# Patient Record
Sex: Female | Born: 1940 | Race: White | Smoking: Former smoker
Health system: Southern US, Community
[De-identification: ages and names within clinical notes are randomized; demographics above are authoritative.]

## PROBLEM LIST (undated history)

## (undated) DIAGNOSIS — I471 Supraventricular tachycardia, unspecified: Secondary | ICD-10-CM

## (undated) DIAGNOSIS — I493 Ventricular premature depolarization: Secondary | ICD-10-CM

## (undated) DIAGNOSIS — I779 Disorder of arteries and arterioles, unspecified: Secondary | ICD-10-CM

## (undated) DIAGNOSIS — J8410 Pulmonary fibrosis, unspecified: Secondary | ICD-10-CM

## (undated) DIAGNOSIS — I10 Essential (primary) hypertension: Secondary | ICD-10-CM

## (undated) DIAGNOSIS — J449 Chronic obstructive pulmonary disease, unspecified: Secondary | ICD-10-CM

## (undated) DIAGNOSIS — I509 Heart failure, unspecified: Secondary | ICD-10-CM

## (undated) DIAGNOSIS — J45909 Unspecified asthma, uncomplicated: Secondary | ICD-10-CM

## (undated) DIAGNOSIS — R0600 Dyspnea, unspecified: Secondary | ICD-10-CM

## (undated) DIAGNOSIS — Z9889 Other specified postprocedural states: Secondary | ICD-10-CM

## (undated) DIAGNOSIS — J302 Other seasonal allergic rhinitis: Secondary | ICD-10-CM

## (undated) DIAGNOSIS — I5181 Takotsubo syndrome: Secondary | ICD-10-CM

## (undated) DIAGNOSIS — R0609 Other forms of dyspnea: Secondary | ICD-10-CM

## (undated) DIAGNOSIS — I272 Pulmonary hypertension, unspecified: Secondary | ICD-10-CM

## (undated) DIAGNOSIS — I499 Cardiac arrhythmia, unspecified: Secondary | ICD-10-CM

## (undated) HISTORY — DX: Disorder of arteries and arterioles, unspecified: I77.9

## (undated) HISTORY — PX: HYSTERECTOMY: SHX81

## (undated) HISTORY — PX: CARDIAC CATHETERIZATION: SHX172

## (undated) HISTORY — DX: Cardiac arrhythmia, unspecified: I49.9

## (undated) HISTORY — DX: Other specified postprocedural states: Z98.890

## (undated) HISTORY — DX: Pulmonary hypertension, unspecified: I27.20

## (undated) HISTORY — PX: TUBAL LIGATION: SHX77

## (undated) HISTORY — DX: Dyspnea, unspecified: R06.00

## (undated) HISTORY — PX: OTHER SURGICAL HISTORY: SHX169

## (undated) HISTORY — DX: Essential (primary) hypertension: I10

## (undated) HISTORY — DX: Pulmonary fibrosis, unspecified: J84.10

## (undated) HISTORY — DX: Supraventricular tachycardia, unspecified: I47.10

## (undated) HISTORY — PX: ABDOMINAL HYSTERECTOMY: SHX81

## (undated) HISTORY — DX: Unspecified asthma, uncomplicated: J45.909

## (undated) HISTORY — DX: Chronic obstructive pulmonary disease, unspecified: J44.9

## (undated) HISTORY — DX: Ventricular premature depolarization: I49.3

## (undated) HISTORY — DX: Supraventricular tachycardia: I47.1

---

## 2008-10-22 ENCOUNTER — Inpatient Hospital Stay
Admit: 2008-10-22 | Disposition: A | Discharge status: Home / Self Care | Payer: Self-pay | Source: Ambulatory Visit | Admitting: Pulmonary Disease

## 2009-03-18 ENCOUNTER — Ambulatory Visit
Admit: 2009-03-18 | Disposition: A | Discharge status: Home / Self Care | Payer: Self-pay | Source: Ambulatory Visit | Admitting: Female Pelvic Medicine and Reconstructive Surgery

## 2009-03-18 LAB — COMPREHENSIVE METABOLIC PANEL
ALT: 28 U/L (ref 3–36)
AST (SGOT): 24 U/L (ref 10–41)
Albumin/Globulin Ratio: 1.5 (ref 1.1–1.8)
Albumin: 3.7 g/dL (ref 3.4–4.9)
Alkaline Phosphatase: 93 U/L (ref 43–112)
BUN: 16 mg/dL (ref 8–20)
Bilirubin, Total: 0.4 mg/dL (ref 0.1–1.0)
CO2: 26 mEq/L (ref 21–30)
Calcium: 8.8 mg/dL (ref 8.6–10.2)
Chloride: 102 mEq/L (ref 98–107)
Creatinine: 0.6 mg/dL (ref 0.6–1.5)
Globulin: 2.5 g/dL (ref 2.0–3.7)
Glucose: 75 mg/dL (ref 70–100)
Potassium: 4.5 mEq/L (ref 3.6–5.0)
Protein, Total: 6.2 g/dL (ref 6.0–8.0)
Sodium: 139 mEq/L (ref 136–146)

## 2009-03-18 LAB — CBC
Hematocrit: 44.9 % (ref 37.0–47.0)
Hgb: 14.1 G/DL (ref 12.0–16.0)
MCH: 29.4 PG (ref 28.0–32.0)
MCHC: 31.4 G/DL — ABNORMAL LOW (ref 32.0–36.0)
MCV: 93.5 FL (ref 80.0–100.0)
MPV: 11.3 FL (ref 9.4–12.3)
Platelets: 275 /mm3 (ref 140–400)
RBC: 4.8 /mm3 (ref 4.20–5.40)
RDW: 13.7 % (ref 11.5–15.0)
WBC: 9.69 /mm3 (ref 3.50–10.80)

## 2009-03-18 LAB — GFR

## 2009-04-11 ENCOUNTER — Ambulatory Visit: Payer: Self-pay

## 2009-04-11 ENCOUNTER — Ambulatory Visit
Admission: RE | Admit: 2009-04-11 | Disposition: A | Discharge status: Home / Self Care | Payer: Self-pay | Source: Ambulatory Visit | Admitting: Female Pelvic Medicine and Reconstructive Surgery

## 2009-04-11 LAB — BASIC METABOLIC PANEL
BUN: 6 mg/dL — ABNORMAL LOW (ref 8–20)
CO2: 20 mEq/L — ABNORMAL LOW (ref 21–30)
Calcium: 8 mg/dL — ABNORMAL LOW (ref 8.6–10.2)
Chloride: 106 mEq/L (ref 98–107)
Creatinine: 0.5 mg/dL — ABNORMAL LOW (ref 0.6–1.5)
Glucose: 90 mg/dL (ref 70–100)
Potassium: 3.9 mEq/L (ref 3.6–5.0)
Sodium: 137 mEq/L (ref 136–146)

## 2009-04-11 LAB — MAGNESIUM: Magnesium: 1.7 mg/dL (ref 1.6–2.3)

## 2009-04-11 LAB — GFR: EGFR: >60

## 2009-04-12 LAB — CBC
Hematocrit: 38.5 % (ref 37.0–47.0)
Hgb: 11.6 g/dL — ABNORMAL LOW (ref 12.0–16.0)
MCH: 28.6 pg (ref 28.0–32.0)
MCHC: 30.1 g/dL — ABNORMAL LOW (ref 32.0–36.0)
MCV: 94.8 fL (ref 80.0–100.0)
MPV: 10.3 fL (ref 9.4–12.3)
Platelets: 225 10*3/uL (ref 140–400)
RBC: 4.06 10*6/uL — ABNORMAL LOW (ref 4.20–5.40)
RDW: 14 % (ref 12–15)
WBC: 10.4 10*3/uL (ref 3.50–10.80)

## 2009-04-12 LAB — BASIC METABOLIC PANEL
BUN: 5 mg/dL — ABNORMAL LOW (ref 8–20)
CO2: 24 mEq/L (ref 21–30)
Calcium: 7.7 mg/dL — ABNORMAL LOW (ref 8.6–10.2)
Chloride: 103 mEq/L (ref 98–107)
Creatinine: 0.6 mg/dL (ref 0.6–1.5)
Glucose: 82 mg/dL (ref 70–100)
Potassium: 4 mEq/L (ref 3.6–5.0)
Sodium: 137 mEq/L (ref 136–146)

## 2009-04-12 LAB — GFR: EGFR: >60

## 2009-04-12 LAB — MAGNESIUM: Magnesium: 2.1 mg/dL (ref 1.6–2.3)

## 2009-04-13 LAB — CBC
Hematocrit: 37.3 % (ref 37.0–47.0)
Hgb: 11.3 g/dL — ABNORMAL LOW (ref 12.0–16.0)
MCH: 28.6 pg (ref 28.0–32.0)
MCHC: 30.3 g/dL — ABNORMAL LOW (ref 32.0–36.0)
MCV: 94.4 fL (ref 80.0–100.0)
MPV: 10.6 fL (ref 9.4–12.3)
Platelets: 220 10*3/uL (ref 140–400)
RBC: 3.95 10*6/uL — ABNORMAL LOW (ref 4.20–5.40)
RDW: 14 % (ref 12–15)
WBC: 9.37 10*3/uL (ref 3.50–10.80)

## 2009-04-13 LAB — BASIC METABOLIC PANEL
BUN: 5 mg/dL — ABNORMAL LOW (ref 8–20)
CO2: 29 mEq/L (ref 21–30)
Calcium: 7.8 mg/dL — ABNORMAL LOW (ref 8.6–10.2)
Chloride: 100 mEq/L (ref 98–107)
Creatinine: 0.5 mg/dL — ABNORMAL LOW (ref 0.6–1.5)
Glucose: 121 mg/dL — ABNORMAL HIGH (ref 70–100)
Potassium: 3.6 mEq/L (ref 3.6–5.0)
Sodium: 136 mEq/L (ref 136–146)

## 2009-04-13 LAB — TSH: TSH: 1.488 u[IU]/mL (ref 0.350–4.940)

## 2009-04-13 LAB — T4, FREE: T4 Free: 1.68 ng/dL — ABNORMAL HIGH (ref 0.70–1.48)

## 2009-04-13 LAB — TROPONIN I: Troponin I: 0.02 ng/mL (ref 0.00–0.09)

## 2009-04-13 LAB — GFR: EGFR: >60

## 2009-04-13 LAB — MAGNESIUM: Magnesium: 2 mg/dL (ref 1.6–2.3)

## 2009-04-13 LAB — CK: Creatine Kinase (CK): 106 U/L (ref 20–140)

## 2009-04-17 LAB — LAB USE ONLY - HISTORICAL SURGICAL PATHOLOGY

## 2010-03-31 ENCOUNTER — Observation Stay
Admission: EM | Admit: 2010-03-31 | Disposition: A | Discharge status: Home / Self Care | Payer: Self-pay | Source: Emergency Department | Admitting: Hospitalist

## 2010-03-31 LAB — CBC AND DIFFERENTIAL
Baso(Absolute): 0.02 10*3/uL (ref 0.00–0.20)
Basophils: 0 % (ref 0–2)
Eosinophils Absolute: 0.1 10*3/uL (ref 0.00–0.70)
Eosinophils: 1 % (ref 0–5)
Hematocrit: 43 % (ref 37.0–47.0)
Hgb: 13.9 g/dL (ref 12.0–16.0)
Immature Granulocytes Absolute: 0.03 10*3/uL
Immature Granulocytes: 0 % (ref 0–1)
Lymphocytes Absolute: 2.4 10*3/uL (ref 0.50–4.40)
Lymphocytes: 19 % (ref 15–41)
MCH: 29.4 pg (ref 28.0–32.0)
MCHC: 32.3 g/dL (ref 32.0–36.0)
MCV: 90.9 fL (ref 80.0–100.0)
MPV: 10.7 fL (ref 9.4–12.3)
Monocytes Absolute: 1.22 10*3/uL — ABNORMAL HIGH (ref 0.00–1.20)
Monocytes: 10 % (ref 0–11)
Neutrophils Absolute: 8.85 10*3/uL
Neutrophils: 70 % (ref 52–75)
Platelets: 306 10*3/uL (ref 140–400)
RBC: 4.73 10*6/uL (ref 4.20–5.40)
RDW: 14 % (ref 12–15)
WBC: 12.62 10*3/uL — ABNORMAL HIGH (ref 3.50–10.80)

## 2010-03-31 LAB — BASIC METABOLIC PANEL
BUN: 15 mg/dL (ref 8–20)
CO2: 24 mEq/L (ref 21–30)
Calcium: 8.9 mg/dL (ref 8.6–10.2)
Chloride: 103 mEq/L (ref 98–107)
Creatinine: 0.8 mg/dL (ref 0.6–1.5)
Glucose: 108 mg/dL — ABNORMAL HIGH (ref 70–100)
Potassium: 4.9 mEq/L (ref 3.6–5.0)
Sodium: 138 mEq/L (ref 136–146)

## 2010-03-31 LAB — GFR: EGFR: >60

## 2010-03-31 LAB — CK: Creatine Kinase (CK): 109 U/L (ref 20–140)

## 2010-03-31 LAB — I-STAT TROPONIN: i-STAT Troponin: 0 ng/mL (ref 0.00–0.09)

## 2010-04-01 LAB — CK
Creatine Kinase (CK): 90 U/L (ref 20–140)
Creatine Kinase (CK): 70 U/L (ref 20–140)

## 2010-04-01 LAB — TSH: TSH: 3.527 u[IU]/mL (ref 0.350–4.940)

## 2010-04-01 LAB — TROPONIN I
Troponin I: 0.13 ng/mL — ABNORMAL HIGH (ref 0.00–0.09)
Troponin I: 0.07 ng/mL (ref 0.00–0.09)

## 2011-01-22 LAB — ECG 12-LEAD
Atrial Rate: 100 {beats}/min
Atrial Rate: 188 {beats}/min
P Axis: 56 degrees
P-R Interval: 112 ms
P-R Interval: 168 ms
Q-T Interval: 258 ms
Q-T Interval: 348 ms
QRS Duration: 118 ms
QRS Duration: 124 ms
QTC Calculation (Bezet): 448 ms
QTC Calculation (Bezet): 456 ms
R Axis: -22 degrees
R Axis: -27 degrees
T Axis: 47 degrees
T Axis: 76 degrees
Ventricular Rate: 100 {beats}/min
Ventricular Rate: 188 {beats}/min

## 2011-01-29 LAB — ECG 12-LEAD
Atrial Rate: 86 {beats}/min
Atrial Rate: 99 {beats}/min
P Axis: 78 degrees
P Axis: 79 degrees
P-R Interval: 150 ms
P-R Interval: 136 ms
Q-T Interval: 390 ms
Q-T Interval: 396 ms
QRS Duration: 82 ms
QRS Duration: 90 ms
QTC Calculation (Bezet): 466 ms
QTC Calculation (Bezet): 508 ms
R Axis: -5 degrees
R Axis: -11 degrees
T Axis: 37 degrees
T Axis: -4 degrees
Ventricular Rate: 86 {beats}/min
Ventricular Rate: 99 {beats}/min

## 2011-02-20 NOTE — Consults (Signed)
Account Number: [id number]      Document ID: [id number]      Admit Date: 04/11/2009      Service Date: 04/13/2009            Patient Location: FI320-01      Patient Type: A            CONSULTING PHYSICIAN: Johnell Comings MD            REFERRING PHYSICIAN:Dr. Lorelei Pont                  CONSULTING SERVICE:      Cardiology.            REASON FOR CONSULTATION:      We thank Dr. Hurshel Keys for asking our opinion in the evaluation and      management of this 70 year old status post laparoscopic sacrocolpopexy and      vaginal hysterectomy with ventricular bigeminy and nonsustained VT noted on      telemetry intraoperatively and postoperatively.            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  Intraoperatively, she was noted to have ventricular      bigeminy and otherwise had an unremarkable course per anesthesia with no      significant hypotension, hypoxia, or complications.  Postoperatively, given      the arrhythmia, [facility]      telemetry monitoring.  On my review of telemetry, she has been predominantly      in sinus rhythm with rare multifocal atrial tachycardia with left bundle      branch block and occasional PVCs with maximal 6-beat nonsustained ventricular      tachycardia and periods of ventricular bigeminy with occasional couplets and      triplets.  She has been asymptomatic throughout this without any      lightheadedness or recent syncope.  She relates only a single episode of      syncope 30 years ago after prolonged travel, and while standing watching her      mother in the ICU, she briefly fainted.            At her baseline, the patient is fairly active.  While she has an      extensive smoking and was  diagnosed with COPD, she has been treated by Dr.      Brynda Rim within the past year, and has improved remarkably following      pulmonary rehabilitation.  She has pretty reasonable exercise tolerance, can      ascend a flight or 2 of stairs and walk 10 to 20 minutes without any      significant exertional dyspnea.  She has never been hospitalized for her      asthma or COPD and is moving air well on today's exam with no wheezes heard.            She has not had any prior cardiac evaluation.  Her coronary risk factors      are limited to her age and smoking.  She has no known diabetes,      hypertension.  Lipid status is unknown.  Her EKG shows sinus rhythm with      poor R wave progression and biatrial enlargement with frequent PVCs.  There      are no diagnostic ST changes or definitive prior infarct.            ALLERGIES:      CECLOR, hives.            MEDICATIONS:      Pulmicort, simethicone, Spiriva, Tylenol p.r.n., Dilaudid p.r.n., Levaquin      500 IV, Tensilon, Flagyl 500 IV x1, Zofran, Ambien, and oxycodone.            SOCIAL HISTORY:      Quit tobacco 10 years ago.  Lives with her husband.  No significant alcohol      or prior illicit drug use.            FAMILY HISTORY:      No premature CAD or sudden death.            REVIEW OF SYSTEMS:      Comprehensive review of systems, including constitutional, eyes, ears,      nose, mouth, throat, cardiovascular, gastrointestinal, genitourinary,      musculoskeletal, integumentary, respiratory, neurologic, psychiatric, and      endocrine, is negative other than what is mentioned already in the history      of present illness.            PHYSICAL EXAMINATION:      VITAL SIGNS:  Temperature 98.5, pulse 70s to 100s, blood pressure 120s to      140s/60s with an average blood pressure in the 120s to 130s/60s, O2      saturation 94% on room air.      GENERAL:  Well appearing, gregarious, seated upright at the edge of her bed      in no apparent respiratory distress or  discomfort.      HEENT:  No scleral icterus or conjunctival pallor, moist mucous membranes.            NECK:  Mild jugular venous distention without hepatojugular reflux, 2+      carotid upstroke without bruit, no thyromegaly or lymphadenopathy.      CARDIAC:  Distant S1, S2 with II/VI holosystolic murmur heard best at the      upper sternal border, no RV heave.  Apical impulse appears nondisplaced.      CHEST:  Distant breath sounds with normal respiratory excursion.  No      wheezes, rales, or rhonchi heard.      ABDOMEN: No abdominal bruits, masses, or hepatosplenomegaly, nontender,      non-distended, good bowel sounds.      EXTREMITIES:  Trace edema distally in the lower third bilaterally.      SKIN:  No rash or jaundice.      NEUROLOGIC:  Alert and oriented to time, place, and person; normal mood and      affect.      MUSCULOSKELETAL:  Normal muscle strength and tone.            DIAGNOSTIC DATA:      EKG and telemetry as reviewed in HPI.            LABORATORY DATA:      CBC with mild anemia, hemoglobin 11.3.  Basic metabolic panel with potassium      of 3.6, magnesium of 2.0.  IMPRESSION:      1.  PVCs with occasional nonsustained ventricular tachycardia and periods      of ventricular bigeminy, asymptomatic.  No history of recent      lightheadedness or syncope.  No history of heart failure.      2.  Mild jugular venous distension and lower extremities edema,      postoperatively.      3.  Status post complex uterine surgery including laparoscopic      sacrocolpopexy and vaginal hysterectomy.      4.  COPD with former tobacco use.      5.  Multifocal atrial tachycardia.      6.  Nonspecifically abnormal EKG with poor R wave progression likely      secondary to #4.      7.  Anemia.            RECOMMENDATIONS:      While the patient is asymptomatic and I suspect the mild jugular venous      distension and lower extremity edema are related just to the fluid shifts      occurring perioperatively, given  recurrent bouts of nonsustained      ventricular tachycardia and abnormal EKG, prefer to obtain echocardiogram      in-house to exclude any underlying structural heart disease.  Barring any      LV dysfunction, there would be no need for beta blocker, particularly in      light of her significantly reduced FEV1 and as she is asymptomatic.  We will      additionally check chest x-ray, BNP, and single troponin and CK.  If these      are all within normal limits, she can be discharged and follow up closely      with her primary care and Dr. Brynda Rim as planned with no need for      continued cardiology followup.  If there is any abnormality on her      echocardiogram, which I will review personally, then I will be back to make      further recommendations.                        Electronic Signing Provider      _______________________________     Date/Time Signed: _____________      Johnell Comings MD ([id number])            D:  04/13/2009 12:27 PM by Dr. Johnell Comings, MD ([id number])      T:  04/13/2009 17:29 PM by [id number]          Everlean Cherry: [id number]) (Doc ID: [id number])                  VH:QIONG Pinell-Salles MD

## 2011-02-20 NOTE — H&P (Signed)
Sabrina Church, CORRELL      MRN:          [id number]      Account:      [id number]      Document ID:  [id number]                  Admit Date: 03/31/2010            Patient Location: FI365-01      Patient Type: V            ATTENDING PHYSICIAN: Iris Pert, MD                  PRIMARY CARE PHYSICIAN:      Dr. Payton Doughty.            CHIEF COMPLAINT:      Palpitations and feeling of heart racing.            HISTORY OF PRESENT ILLNESS:      The patient is 70 year old female with history of COPD as well as      nonsustained ventricular tachycardia in the perioperative setting.  During      this episode in 2010, around the time of a hysterectomy and incontinence      surgery, she developed nonsustained ventricular tachycardia. She had an      echocardiogram, which did not demonstrate major structural heart disease      and she was discharged without any prescribed cardiology followup.  She had      been feeling in her usual state of health, and her COPD has been very well      compensated and then the day of admission, she developed the acute onset of      palpitations.  The day of admission and she was doing Christmas shopping      and then felt rushed that she needed to get home quickly and suddenly      developed a racing heart.  She did not have associated lightheadedness,      although she did feel a choking sensation.  She thought she was having a      panic attack, tried to sit and calm down, tried to breathe in and out      through a bag and was still unable to be improved.  She talked to her      husband.  They thought at that juncture maybe they could check her blood      pressure, although it was not recording, most likely due to her tachycardia      and because of this, she came to the emergency department.  In the      emergency department, she did not have the choking sensation anymore;      however, she was still very diaphoretic.  She did not have any dyspnea or      chest pain still.  At this  juncture, she was given medicines and finally      her symptoms resolved.  Otherwise, she denies any prolonged immobility,      headache, vision changes, hearing changes, GI bleeding symptoms,      arthralgias, myalgias, skin rashes, urinary symptoms, fevers, or chills.            MEDICATIONS:      Prior to admission are Spiriva and Advair and estrogen cream.            ALLERGIES:      CECLOR.  Page 1 of 3      Sabrina Church, [name]      MRN:          [id number]      Account:      [id number]      Document ID:  [id number]                  PAST MEDICAL AND SURGICAL HISTORY:      COPD, well compensated.  Hysterectomy and incontinence surgery in 2010,      nonsustained ventricular tachycardia seen by cardiology at that time.      Echocardiogram was performed, no other cardiology followup was recommended.            FAMILY HISTORY:      Stated that her brother died in his sleep and they said it was a massive      heart attack and he had a lot of blockages.  This may qualify as sudden      cardiac death.            SOCIAL HISTORY:      The patient is married, has 3 children.  Quit smoking 15 years prior to      admission.  Does not drink alcohol.            REVIEW OF SYSTEMS:      Per the history of present illness. All other systems negative.            PHYSICAL EXAMINATION:      VITAL SIGNS:  Temperature afebrile, systolic blood pressure 130,      respiratory rate 18, pulse is now 100, O2 sat is normal on room air.      GENERAL:  She is sitting up in no apparent distress and wants to leave the      hospital.      HEENT:  Normocephalic, atraumatic.  Pupils round to light.      NECK:  Supple without thyromegaly or lymphadenopathy.      CARDIOVASCULAR:  Normal jugular venous pressure, no carotid bruits, pulses,      regular rate and rhythm without murmurs, rubs or gallops.      LUNGS:  Clear without crackles.      ABDOMEN:  Normoactive bowel sounds, soft, nontender, nondistended, without       hepatosplenomegaly.      EXTREMITIES:  There is no lower extremity edema.      SKIN:  There are no rashes except around her left neck.  She does have an      area of some scaliness and erythema consistent with ring worm.      NEUROLOGIC:  She is alert and oriented x3.            LABORATORY DATA:      Initial strips look consistent with SVT, likely AVNRT.  Unclear whether      this was MAT or not, although I do not see 3 distinct P-wave morphologies.      I do not think it was MAT.  Follow-up EKG demonstrates most likely sinus      rhythm.  There is a right bundle branch block pattern.  There are      occasional PVCs.  EKG in 2010 demonstrated normal sinus rhythm with      frequent PVCs.  There was no right bundle branch block pattern at that      time.  LABORATORY DATA:      White count 12.5; hematocrit 43; platelets 306; sodium 138; potassium 4.9;                                   Page 2 of 3      ASHLEN, KIGER      MRN:          [id number]      Account:      [id number]      Document ID:  [id number]                  chloride 103; CO2 24; BUN 15; creatinine 0.8; glucose 108; calcium 8.9; CK      109; troponin 0.  CT scan of the chest was performed and negative for a PE.            In the emergency department, the patient received 3 rounds of adenosine      without any relief and subsequent to that received IV and then oral      Cardizem and subsequently the symptoms resolved and came in this now likely      normal sinus rhythm.            IMPRESSION:      1. Supraventricular tachycardia trigger unclear at this juncture.  Unlikely      to be ischemic.  Has normal left ventricular function and essentially a      normal structural heart.  She did have a choking sensation, which is most      likely symptomatic secondary to the supraventricular tachycardia.      2.  History of chronic obstructive pulmonary disease.            PLAN:      1. Admit to cardiac telemetry under observation status.      2. Rule  out myocardial infarction.      3. Check TSH.      4. Keep her off AV nodal blockers at this time to actually monitor and see      if her symptoms recur.      5. We will keep her n.p.o. after midnight in case further testing is      needed.  I have called cardiology and ask that they see the patient early      in the morning if possible to attempt for an early discharge.            I called [facility] as cardiology because they have seen the patient      during prior hospitalizations.                        Electronic Signing Provider            D:  03/31/2010 22:30 PM by Dr. Iris Pert, MD ([id number])      T:  03/31/2010 23:16 PM by [email or web address]                  VQ:QVZDG Oshry MD                                   Page 3 of 3      Authenticated by Iris Pert, MD ([id number]) On 04/03/2010 08:35:25 AM

## 2011-02-20 NOTE — Consults (Signed)
Sabrina Church, Sabrina Church      MRN:          [id number]      Account:      [id number]      Document ID:  [id number]      Service Date: 04/01/2010            Admit Date: 03/31/2010            Patient Location: DISCHARGED 04/01/2010      Patient Type: V            CONSULTING PHYSICIAN: Len Childs MD            REFERRING PHYSICIAN: Roe Coombs MD                  CONSULTING SERVICE:      Cardiology.            REASON FOR CONSULTATION:      Supraventricular tachycardia.            IMPRESSION:      1.  Supraventricular tachycardia. The episode yesterday represents the      first major episode ever, though she potentially has had other episodes      over the years that were briefer in duration.      2.  History of premature ventricular contractions and brief nonsustained      ventricular tachycardia, dating back at least a year ago.      3.  Preserved left ventricular function by echocardiogram, low normal at      50% to 55%.      4.  Remote history of syncope, but nothing recently.      5.  Chronic obstructive pulmonary disease.            RECOMMENDATIONS:      I have along the bedside discussion with the patient and her husband, and      also discussed the findings and recommendations with Dr. Vickii Chafe.      Certainly, for her SVT, there are treatment options.  Since the episode      yesterday represents the first and only known sustained major episode, it      would be reasonable to consider a conservative approach and see how things      go over time.  She has been told about vagal maneuvers, and I reinforced      some of these that she could try if she were to have another episode in the      future.  The second treatment option would be to take a daily suppressive      medication, either a calcium blocker or beta blocker.  Given her COPD, beta      blockers would probably not be the best choice, though otherwise,      especially with the PVCs and brief NSVT, beta blockers would be the      preferred  choice.  We would have to use probably diltiazem, instead, though      with her blood pressure running on the low side, she may suffer from      intolerable side effects.  The third option, of course, would be to      consider a diagnostic EP study and possible ablation of the SVT.  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 After we      discussed these options for now, she has opted that she would like to go      home with no specific therapy but see how things go over the upcoming days      and weeks and months.  I have asked her to follow up with me closely, so      that if she does have future episodes, we can consider one of the other      options as described above including possibly ablation therapy.  In part,      since she has not had one, and given the longstanding brief nonsustained      ventricular tachycardia, I have also suggested that she undergo a routine      ischemic evaluation, and we will arrange this within the next couple of      weeks on an outpatient basis.  I have also her then to follow up with Dr.      Johnell Comings, our general cardiologist who knows her from a      consultation in the hospital approximately 1 year ago perioperatively, and      thus, again ultimately the plan for now would be that she will be      discharged home to follow up with me and with Dr. Johnell Comings, and      also in the meantime undergo an outpatient ischemic evaluation.            Thank you, Dr. Vickii Chafe and Dr. Azucena Cecil for asking [place] to see her.            HISTORY OF PRESENT ILLNESS:      The patient is a  pleasant 70 year old lady admitted to [facility] yesterday with sustained palpitations lasting several hours.  She      came into the [facility] emergency room, and EKG there      documented a sustained SVT with a heart rate approximately 190.  I have      _____ both that she both that adenosine did work to terminate the      tachycardia, but also that it did not.  It sounds as if the reality is that      adenosine was not effective, but then diltiazem ultimately converted her      back to normal sinus rhythm after being given intravenously.  She has      maintained normal sinus rhythm since the episodes, and has felt back to      baseline.  She has had occasional PVCs and couplets and even triplets noted      on telemetry, though she has been mostly asymptomatic with these.  She has      not had  recent chest pain or shortness of breath or syncope, though she      does have underlying chronic obstructive pulmonary disease.  She is on no      heart or rhythm-related medications at this time.  She herself thinks the      episode of SVT occurred because she was under a great deal of stress and in      a rush, when all of a sudden she felt her heart racing.  I asked her about      other possible episodes.  She reports that she has had intermittent      episodes of brief palpitations for years, but mostly lasting only up to 3      minutes or so.  She attributed these previous episodes to /"panic attacks./"      Certainly, these may or may not have been SVT, but certainly it would be      reasonable to consider that she has had other brief episodes of SVT other      than the major episode yesterday.  I was asked to see her in consultation      by Dr. Azucena Cecil and Dr. Vickii Chafe for electrophysiology consultation      regarding the evaluation and management of the SVT.            PAST MEDICAL HISTORY:                                   Page 2 of 4      Sabrina Church, Sabrina Church      MRN:           [id number]      Account:      [id number]      Document ID:  [id number]      Service Date: 04/01/2010            Significant also for COPD, PVCs, nonsustained ventricular tachycardia,      possible MAT, prior echocardiogram described no known coronary disease or      prior ischemic evaluation.  She has a history of hysterectomy.            ALLERGIES:      CECLOR.            MEDICATIONS:      Advair and Spiriva.            SOCIAL HISTORY:      Her supportive husband is at the bedside.  She is a former smoker.  She      denies alcohol abuse.            FAMILY HISTORY:      Negative for premature coronary disease.            REVIEW OF SYSTEMS:      CONSTITUTIONAL:  No recent weight gain or loss.      DERMATOLOGIC:  No skin rash or lesion.      INFECTIOUS DISEASE:  No fevers or chills.      ENT:  No epistaxis or sinusitis.      CARDIOVASCULAR:  Please see HPI.      PULMONARY:  No cough, sputum, or wheezing.  She does have the chronic COPD.      GASTROINTESTINAL:  No diarrhea, hematochezia, or melena.      GENITOURINARY:  No dysuria or hematuria.      MUSCULOSKELETAL:  No  joint pain or muscle pain.      NEUROLOGIC:  No prior stroke, seizure, or syncope other than remotely.      ENDOCRINE:  Negative.      PSYCHIATRIC:  Negative.      All other systems were reviewed and were negative.            PHYSICAL EXAMINATION:      GENERAL:  She is a pleasant female in no acute distress.      VITAL SIGNS:  Temperature is 95.3, pulse 65, respirations 18, blood      pressure 127/57.      HEENT:  Reveals PERRL with EOMI.  Oropharynx is clear.      NECK:  Supple with no adenopathy.      HEART:  Regular rate and rhythm with S1, S2 and no murmur, rub, or gallop.      LUNGS:  Clear to auscultation bilaterally with no wheezes, rales, or      crackles.      ABDOMEN:  Soft, nontender, nondistended with normoactive bowel sounds.      Radial pulses are 2+ bilaterally.      EXTREMITIES:  Reveals no clubbing, cyanosis, or edema.       NEUROLOGIC:  She is alert and oriented x4 with a nonfocal exam.                                   Page 3 of 4      Sabrina Church, Sabrina Church      MRN:          [id number]      Account:      [id number]      Document ID:  [id number]      Service Date: 04/01/2010            SKIN:  Warm and dry.      HEENT:  Head is normocephalic and atraumatic.      NECK: Also reveals no adenopathy, thyromegaly, or JVD.            LABORATORY DATA:      White count is 12, hematocrit 43 and platelet count 310.  Potassium is 4.9,      BUN 15, creatinine 0.8.  Troponins were negative.  TSH was normal.  CT scan      of the chest reportedly showed no infiltrates or pulmonary embolus.  EKG      initially showed SVT with a rate approximately 190 with a right bundle      branch block.  Followup EKG shows sinus rhythm with right bundle branch      block. PVCs have been seen, and also on telemetry brief runs of same have      been seen, with multiple morphologies.  Prior echocardiogram as mentioned.                        Electronic Signing Provider            D:  04/01/2010 15:13 PM by Dr. Molly Maduro L. Marianna Fuss, MD [id number]      T:  04/01/2010 16:08 PM by [id number]                  YQ:[id number] Maletsky-Smith MD  Page 4 of 4      Authenticated by Molly Maduro L. Sirenity Shew, MD [phone]) On 04/06/2010 10:24:24 PM

## 2011-04-21 NOTE — Procedures (Signed)
[facility]      ,            Transthoracic Echocardiogram      2D, M-mode, Doppler, and Color Doppler      Study date:  13-Apr-2009            Patient: Sabrina Church      MR #: [id number]      Account #: [id number]      DOB: [birth year]      Age: 70 years      Gender: Female      Height: 64 in      Weight: 169.6 lb      BSA: 1.83 m            Sonographer:  Hillery Jacks, RDCS            CLINICAL QUESTION: NSVT, EDEMA, LVFXN.            HISTORY: PRIOR HISTORY: COPD, ATRIAL TACY., HYSTERECTOMY            PROCEDURE: This was a routine study. The transthoracic approach was used. The      study included complete 2D imaging, M-mode, complete spectral Doppler, and      color Doppler. Image quality was adequate.            SYSTEM MEASUREMENT TABLES            2D      %FS: 19.3 %      Ao Diam: 2.6 cm      EF(Teich): 39.6 %      ESV(Teich): 65.3 ml      IVSd: 0.9 cm      LA Diam: 3.6 cm      LVIDd: 4.8 cm      LVIDs: 3.9 cm      LVPWd: 1 cm      RVIDd: 3.1 cm      SV(Teich): 42.9 ml            CW      AV Env.Ti: 269.9 ms      AV VTI: 33 cm      AV Vmax: 172.4 cm/s      AV Vmean: 122.9 cm/s      AV maxPG: 11.9 mmHg      AV meanPG: 6.8 mmHg      PV Vmax: 102.3 cm/s      PV maxPG: 4.2 mmHg      RAP: 10 mmHg      TR Vmax: 312.9 cm/s      TR maxPG: 39.4 mmHg            MM      AV Cusp: 1.6 cm            PW      E': 0.1 m/s      E/E': 9.9      HR: 199.2 BPM      LVOT Env.Ti: 301.3 ms      LVOT VTI: 21.6 cm      LVOT Vmax: 104.8 cm/s      LVOT Vmean: 71.9 cm/s      LVOT maxPG: 4.5 mmHg      LVOT meanPG: 2.4 mmHg      MV A Vel: 99.4 cm/s      MV DecT: 177.3 ms      MV E Vel: 90.2 cm/s      MV E/A Ratio: 0.9      MV PHT: 51.4 ms  MV dec slope: 5.1 m/s2      MVA By PHT: 4.3 cm2      RVSP: 49.4 mmHg            LEFT VENTRICLE: Size was normal. Systolic function was at the lower limits of      normal. Ejection fraction was estimated in the range of 50 % to 55 %. There      were no regional wall motion abnormalities. Wall thickness was  normal. Doppler:      There was an increased relative contribution of atrial contraction to      ventricular filling.            AORTIC VALVE: demonstrated mild calcification. Doppler: There was very mild      stenosis. There was trivial regurgitation.            AORTA: The root exhibited normal size.            MITRAL VALVE: No echocardiographic evidence for prolapse. Doppler: There was      mild regurgitation.            LEFT ATRIUM: Size was normal.            RIGHT VENTRICLE: The ventricle was mildly dilated. Systolic function was      normal.            PULMONIC VALVE: Doppler: There was trivial regurgitation.            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            TRICUSPID VALVE: Doppler: There was mild to moderate regurgitation.            RIGHT ATRIUM: The atrium was mildly dilated.            SYSTEMIC VEINS: IVC: Respirophasic changes were blunted (less than 50%      variation).            PERICARDIUM: There was no pericardial effusion.            Prepared and signed by            Laren Everts, MD      Signed 14-Apr-2009 12:37:57                  (N: [id number])

## 2011-05-05 DIAGNOSIS — I5181 Takotsubo syndrome: Secondary | ICD-10-CM

## 2011-05-05 HISTORY — DX: Takotsubo syndrome: I51.81

## 2012-02-08 ENCOUNTER — Inpatient Hospital Stay
Admission: EM | Admit: 2012-02-08 | Discharge: 2012-02-14 | DRG: 292 | Disposition: A | Discharge status: Home / Self Care | Payer: Medicare Other | Source: Ambulatory Visit | Attending: Infectious Disease | Admitting: Infectious Disease

## 2012-02-08 ENCOUNTER — Emergency Department: Payer: Medicare Other

## 2012-02-08 ENCOUNTER — Emergency Department
Admission: EM | Admit: 2012-02-08 | Discharge: 2012-02-08 | Disposition: A | Discharge status: Other Acute Inpatient Hospital | Payer: Medicare Other | Attending: Emergency Medicine | Admitting: Emergency Medicine

## 2012-02-08 ENCOUNTER — Inpatient Hospital Stay: Payer: Medicare Other | Admitting: Hospitalist

## 2012-02-08 DIAGNOSIS — J4489 Other specified chronic obstructive pulmonary disease: Secondary | ICD-10-CM | POA: Insufficient documentation

## 2012-02-08 DIAGNOSIS — E873 Alkalosis: Secondary | ICD-10-CM | POA: Diagnosis not present

## 2012-02-08 DIAGNOSIS — R7309 Other abnormal glucose: Secondary | ICD-10-CM | POA: Diagnosis not present

## 2012-02-08 DIAGNOSIS — Z87891 Personal history of nicotine dependence: Secondary | ICD-10-CM

## 2012-02-08 DIAGNOSIS — I4949 Other premature depolarization: Secondary | ICD-10-CM | POA: Diagnosis not present

## 2012-02-08 DIAGNOSIS — E876 Hypokalemia: Secondary | ICD-10-CM | POA: Diagnosis not present

## 2012-02-08 DIAGNOSIS — I5021 Acute systolic (congestive) heart failure: Principal | ICD-10-CM | POA: Diagnosis present

## 2012-02-08 DIAGNOSIS — I452 Bifascicular block: Secondary | ICD-10-CM | POA: Diagnosis present

## 2012-02-08 DIAGNOSIS — Z23 Encounter for immunization: Secondary | ICD-10-CM

## 2012-02-08 DIAGNOSIS — Z8249 Family history of ischemic heart disease and other diseases of the circulatory system: Secondary | ICD-10-CM

## 2012-02-08 DIAGNOSIS — I509 Heart failure, unspecified: Secondary | ICD-10-CM | POA: Diagnosis present

## 2012-02-08 DIAGNOSIS — N390 Urinary tract infection, site not specified: Secondary | ICD-10-CM | POA: Diagnosis present

## 2012-02-08 DIAGNOSIS — I472 Ventricular tachycardia, unspecified: Secondary | ICD-10-CM | POA: Diagnosis not present

## 2012-02-08 DIAGNOSIS — I5181 Takotsubo syndrome: Secondary | ICD-10-CM | POA: Diagnosis present

## 2012-02-08 HISTORY — DX: Chronic obstructive pulmonary disease, unspecified: J44.9

## 2012-02-08 LAB — COMPREHENSIVE METABOLIC PANEL
ALT: 49 U/L (ref 0–55)
AST (SGOT): 44 U/L — ABNORMAL HIGH (ref 5–34)
Albumin/Globulin Ratio: 1.3 (ref 0.9–2.2)
Albumin: 3.4 g/dL — ABNORMAL LOW (ref 3.5–5.0)
Alkaline Phosphatase: 85 U/L (ref 40–150)
Anion Gap: 12 (ref 5.0–15.0)
BUN: 18 mg/dL (ref 7.0–19.0)
Bilirubin, Total: 0.8 mg/dL (ref 0.2–1.2)
CO2: 25 mEq/L (ref 22–29)
Calcium: 9.1 mg/dL (ref 7.9–10.6)
Chloride: 104 mEq/L (ref 98–107)
Creatinine: 0.7 mg/dL (ref 0.6–1.0)
Globulin: 2.7 g/dL (ref 2.0–3.6)
Glucose: 99 mg/dL (ref 70–100)
Potassium: 4.4 mEq/L (ref 3.5–5.1)
Protein, Total: 6.1 g/dL (ref 6.0–8.3)
Sodium: 141 mEq/L (ref 136–145)

## 2012-02-08 LAB — CBC AND DIFFERENTIAL
Basophils Absolute Automated: 0.03 10*3/uL (ref 0.00–0.20)
Basophils Automated: 0 % (ref 0–2)
Eosinophils Absolute Automated: 0.04 10*3/uL (ref 0.00–0.70)
Eosinophils Automated: 0 % (ref 0–5)
Hematocrit: 43.3 % (ref 37.0–47.0)
Hgb: 13.8 g/dL (ref 12.0–16.0)
Lymphocytes Absolute Automated: 1.78 10*3/uL (ref 0.50–4.40)
Lymphocytes Automated: 21 % (ref 15–41)
MCH: 29.2 pg (ref 28.0–32.0)
MCHC: 31.9 g/dL — ABNORMAL LOW (ref 32.0–36.0)
MCV: 91.7 fL (ref 80.0–100.0)
MPV: 10.2 fL (ref 9.4–12.3)
Monocytes Absolute Automated: 0.8 10*3/uL (ref 0.00–1.20)
Monocytes: 9 % (ref 0–11)
Neutrophils Absolute: 6.04 10*3/uL (ref 1.80–8.10)
Neutrophils: 70 % (ref 52–75)
Platelets: 260 10*3/uL (ref 140–400)
RBC: 4.72 10*6/uL (ref 4.20–5.40)
RDW: 15 % (ref 12–15)
WBC: 8.69 10*3/uL (ref 3.50–10.80)

## 2012-02-08 LAB — URINALYSIS, REFLEX TO MICROSCOPIC EXAM IF INDICATED
Bilirubin, UA: NEGATIVE
Glucose, UA: NEGATIVE
Ketones UA: NEGATIVE
Nitrite, UA: POSITIVE — AB
Protein, UR: 100 — AB
Specific Gravity UA: 1.025 (ref 1.001–1.035)
Urine pH: 6 (ref 5.0–8.0)
Urobilinogen, UA: NORMAL mg/dL

## 2012-02-08 LAB — B-TYPE NATRIURETIC PEPTIDE: B-Natriuretic Peptide: 2230 pg/mL — ABNORMAL HIGH (ref 0–100)

## 2012-02-08 LAB — GFR: EGFR: >60

## 2012-02-08 LAB — TROPONIN I: Troponin I: 0.04 ng/mL (ref 0.00–0.09)

## 2012-02-08 MED ORDER — NITROFURANTOIN MACROCRYSTAL 50 MG PO CAPS
100.00 mg | ORAL_CAPSULE | Freq: Once | ORAL | Status: AC
Start: 2012-02-08 — End: 2012-02-08
  Filled 2012-02-08: qty 1

## 2012-02-08 MED ORDER — ASPIRIN 81 MG PO CHEW
324.00 mg | CHEWABLE_TABLET | Freq: Once | ORAL | Status: AC
Start: 2012-02-08 — End: 2012-02-08
  Administered 2012-02-08: 324 mg via ORAL
  Filled 2012-02-08: qty 4

## 2012-02-08 MED ORDER — FUROSEMIDE 10 MG/ML IJ SOLN
20.00 mg | Freq: Once | INTRAMUSCULAR | Status: AC
Start: 2012-02-08 — End: 2012-02-08
  Administered 2012-02-08: 20 mg via INTRAVENOUS
  Filled 2012-02-08: qty 4

## 2012-02-08 MED ORDER — NITROFURANTOIN MACROCRYSTAL 50 MG PO CAPS
ORAL_CAPSULE | ORAL | Status: AC
Start: 2012-02-08 — End: 2012-02-08
  Administered 2012-02-08: 100 mg via ORAL
  Filled 2012-02-08: qty 1

## 2012-02-08 MED ORDER — INFLUENZA VIRUS VACC SPLIT PF IM SUSP
0.50 mL | INTRAMUSCULAR | Status: AC | PRN
Start: 2012-02-08 — End: 2012-02-14
  Administered 2012-02-14: 0.5 mL via INTRAMUSCULAR
  Filled 2012-02-08: qty 0.5

## 2012-02-08 MED ORDER — PNEUMOCOCCAL VAC POLYVALENT 25 MCG/0.5ML IJ INJ
0.50 mL | INJECTION | INTRAMUSCULAR | Status: DC | PRN
Start: 2012-02-08 — End: 2012-02-14

## 2012-02-08 NOTE — ED Notes (Signed)
Pt states bilateral leg swelling for past couple weeks. Pt states her husband died 2 weeks ago and she thought all related to stress. Sob started 2 weeks ago. Pt states her pmd told her to come in.

## 2012-02-08 NOTE — ED Notes (Signed)
Pt still waiting for bed assignment. Pt talk on cell phone. Will continue to monitor

## 2012-02-08 NOTE — ED Provider Notes (Signed)
EMERGENCY DEPARTMENT HISTORY AND PHYSICAL EXAM     Physician/Midlevel provider first contact with patient: 02/08/12 1729         Date: 02/08/2012  Patient Name: Sabrina Church    History of Presenting Illness     Chief Complaint   Patient presents with   . Shortness of Breath   . Leg Swelling       History Provided By: patient    Chief Complaint: leg swelling  Onset: x 2 weeks  Timing: constant  Location: bilateral legs  Quality: swelling  Severity: moderate  Modifying Factors:   Associated Symptoms: intermittent SOB, and swelling in bilateral feet.     Additional History: Sabrina Church is a 71 y.o. female with h/o COPD, visiting ED after consulting PMD for swelling in bilateral legs and feet x 2 weeks; sts she has never had these symptoms before. Pt sts she also feels SOB when walking or vacuuming around the house. The SOB resolves when resting. sts currently not having any SOB while sitting.   Denies CP, palpitations, n/v/d, abdominal pain, fever, coughing, calf pain, dyspnea.     Her husband passed away 2 wks ago.    Pulmonologist: Dr. Brynda Rim  Cardiologist: Dr. Marianna Fuss.   PMD: Wyvonne Lenz    PCP: Christa See, MD      Current Facility-Administered Medications   Medication Status Dose Route Frequency Provider Last Rate Last Dose   . aspirin chewable tablet 324 mg Active  324 mg Oral Once Marquette Old, MD       . furosemide (LASIX) injection 20 mg Completed  20 mg Intravenous Once Marquette Old, MD   20 mg at 02/08/12 1838   . nitrofurantoin (MACRODANTIN) capsule 100 mg Completed  100 mg Oral Once Marquette Old, MD   100 mg at 02/08/12 1837     Current Outpatient Prescriptions   Medication Status Sig Dispense Refill   . fluticasone (FLOVENT DISKUS) 50 MCG/BLIST diskus inhaler Active Inhale 250 mcg into the lungs daily.       Marland Kitchen levalbuterol (XOPENEX HFA) 45 MCG/ACT inhaler Active Inhale 1-2 puffs into the lungs every 4 (four) hours as needed.       . tiotropium (SPIRIVA) 18 MCG inhalation capsule Active Place 18 mcg  into inhaler and inhale daily.           Past History     Past Medical History:  Past Medical History   Diagnosis Date   . COPD (chronic obstructive pulmonary disease)    . Irregular heart beat        Past Surgical History:  Past Surgical History   Procedure Date   . Hysterectomy    . Proaspe        Family History:  History reviewed. No pertinent family history.    Social History:  History   Substance Use Topics   . Smoking status: Former Games developer   . Smokeless tobacco: Not on file   . Alcohol Use: No       Allergies:  Allergies   Allergen Reactions   . Ceclor (Cefaclor) Hives       Review of Systems     Review of Systems   Constitutional: Negative for fever and diaphoresis.   HENT: Negative for nosebleeds and neck pain.    Eyes: Negative for redness.   Respiratory: Positive for sputum production. Negative for cough.    Cardiovascular: Positive for leg swelling. Negative for chest pain and palpitations.   Gastrointestinal: Negative for nausea,  vomiting and abdominal pain.   Genitourinary: Negative for flank pain.   Musculoskeletal: Negative for back pain.   Neurological: Negative for dizziness and headaches.   Endo/Heme/Allergies:        No h/o DM.    All other systems reviewed and are negative.          Physical Exam   BP 151/82  Pulse 99  Temp(Src) 97.9 F (36.6 C) (Oral)  Resp 17  Ht 1.6 m  Wt 90.719 kg  BMI 35.44 kg/m2  SpO2 95%    Physical Exam   Constitutional: Patient is oriented to person, place, and time and well-developed, well-nourished, and in no distress. Speaks in full sentences  Head: Normocephalic and atraumatic.   Eyes: EOM are normal. Pupils are equal, round, and reactive to light. pink sunconj. No scleral icterus  ENT: OP clear, MMM  Neck: Normal range of motion. Neck supple. + JVD 2/3rds up neck  Cardiovascular: Normal rate and regular rhythm. No murmurs or rubs.  Pulmonary/Chest: incr RR with crackles at both lung bases posteriorly, no wheezing. mild respiratory distress.   Abdominal:  Soft. There is no tenderness. No rebound or guarding.  Musculoskeletal: Normal range of motion. 3+ bilat LE pitting edema. No calf cords or TTP  Neurological: Patient is alert and oriented to person, place, and time. GCS score is 15.   Skin: Skin is warm and dry. No erythema      Diagnostic Study Results     Labs -     Results     Procedure Component Value Units Date/Time    Troponin I [[id number]] Collected:02/08/12 1806    Specimen Information:Blood Updated:02/08/12 1854     Troponin I 0.04 ng/mL     B-type Natriuretic Peptide (BNP) [[id number]]  (Abnormal) Collected:02/08/12 1806    Specimen Information:Blood Updated:02/08/12 1852     B-Natriuretic Peptide 2230 (H) pg/mL     Comprehensive Metabolic Panel (CMP) [[id number]]  (Abnormal) Collected:02/08/12 1806    Specimen Information:Blood Updated:02/08/12 1846     Glucose 99 mg/dL      BUN 59.5 mg/dL      Creatinine 0.7 mg/dL      Sodium 638 mEq/L      Potassium 4.4 mEq/L      Chloride 104 mEq/L      CO2 25 mEq/L      Calcium 9.1 mg/dL      Protein, Total 6.1 g/dL      Albumin 3.4 (L) g/dL      AST (SGOT) 44 (H) U/L      ALT 49 U/L      Alkaline Phosphatase 85 U/L      Bilirubin, Total 0.8 mg/dL      Globulin 2.7 g/dL      Albumin/Globulin Ratio 1.3      Anion Gap 12.0     GFR [[id number]] Collected:02/08/12 1806     EGFR >60.0   Updated:02/08/12 1846    Urine Culture [[id number]] Collected:02/08/12 1726    Specimen Information:Urine / Urine, Clean Catch Updated:02/08/12 1743    UA, Reflex to Microscopic [[id number]]  (Abnormal) Collected:02/08/12 1716    Specimen Information:Urine Updated:02/08/12 1740     Urine Type Clean Catch      Color, UA Yellow      Clarity, UA Slightly Cloudy      Specific Gravity UA 1.025      Urine pH 6.0      Leukocytes, UA Large (A)      Nitrite, UA Positive (A)  Protein, UA 100 (A)      Glucose, UA Negative      Ketones UA Negative      Urobilinogen, UA Normal mg/dL      Bilirubin, UA Negative      Blood, UA Small (A)      RBC, UA 0 - 5  /HPF      WBC, UA 26 - 50 (A) /HPF      Squamous Epithelial Cells, Urine 0 - 5 /HPF      Urine Bacteria Many (A) /HPF     CBC and differential [[id number]]  (Abnormal) Collected:02/08/12 1716    Specimen Information:Blood Updated:02/08/12 1735     WBC 8.69 x10 3/uL      RBC 4.72 x10 6/uL      Hgb 13.8 g/dL      Hematocrit 30.8 %      MCV 91.7 fL      MCH 29.2 pg      MCHC 31.9 (L) g/dL      RDW 15 %      Platelets 260 x10 3/uL      MPV 10.2 fL      Neutrophils 70 %      Lymphocytes Automated 21 %      Monocytes 9 %      Eosinophils Automated 0 %      Basophils Automated 0 %      Immature Granulocyte Unmeasured %      Neutrophils Absolute 6.04 x10 3/uL      Abs Lymph Automated 1.78 x10 3/uL      Abs Mono Automated 0.80 x10 3/uL      Abs Eos Automated 0.04 x10 3/uL      Absolute Baso Automated 0.03 x10 3/uL      Absolute Immature Granulocyte Unmeasured x10 3/uL           Radiologic Studies -   Radiology Results (24 Hour)     Procedure Component Value Units Date/Time    Chest 2 Views [[id number]] Collected:02/08/12 1747    Order Status:Completed  Updated:02/08/12 1812    Narrative:    CLINICAL INDICATION: Shortness of breath. Swelling      COMPARISON: 04/13/2009.     FINDINGS: Two views. Stable cardiomegaly and mediastinal silhouette.   Interstitial and alveolar pulmonary edema. Small right sided and small  to moderate left sided layering  pleural effusions. No  pneumothorax.       Impression:     CHF with bilateral pleural effusions.      .      Medical Decision Making   I am the first provider for this patient.    I reviewed the vital signs, available nursing notes, past medical history, past surgical history, family history and social history.    Vital Signs-Reviewed the patient's vital signs.     Patient Vitals for the past 12 hrs:   BP Temp Pulse Resp   02/08/12 1804 151/82 mmHg - 99  17    02/08/12 1648 139/87 mmHg 97.9 F (36.6 C) 107  -       Pulse Oximetry Analysis - Low normal 94% on RA.     EKG:  Interpreted  by the EP.   Time Interpreted:    Rate: 113   Rhythm: Sinus Tachycardia  With PVC's   Interpretation: RBBB, LAD, LVH, Q waves infer in III, avf; poor R wave progression anterolat   Comparison: 03/2010- unchanged except Q in avf    Old Medical Records:  Old medical records reviewed.   Pt seen in 2010 by Dr. Marianna Fuss for SVT; preserved LV function by ECHO was normal.   Pt admitted at [facility] in 2011 for palpitations from non sustained V. Tach.       ED Course: Recommended admission to pt for CHF; pt requests admission to [facility] hospital    Consultations:  1949: Dr. Rowland Lathe called back, sts he is part of general hospitalists group so page [place] hospitalist at [facility]   1954: d/w Dr. Dorthea Cove ([facility] hospitalist [place]); will admit    Provider Notes: 71 yo woman with PMH COPD and h/o SVT p/w 2 wks DOE and BLE edema, with crackles and JVD on exam, CXR findings of pulm edema, elevated BNP--> suggests CHF exacerbation. Prior echo's chowed normal LV fxn and no valvular issues. Pt denies CP with nonspecific ecg and neg Tn. Unclear as to trigger of CHF. Given asa, as ACS on ddx. Not wheezing with no cough or fever to suggest COPD exacerbation or PNA. Given lasix for diuresis. Requires admission as pt mildly hypoxic on RA when she moves around.        Diagnosis     Clinical Impression:   1. CHF (congestive heart failure)    2. UTI (urinary tract infection)        _______________________________    Attestations:  This note is prepared by Volanda Napoleon, acting as Scribe for Dr. Marquette Old,  MD.    Dr. Dory Peru, MD., the scribe's documentation has been prepared under my direction and personally reviewed by me in its entirety.  I confirm that the note above accurately reflects all work, treatment, procedures, and medical decision making performed by me.      _______________________________          Marquette Old, MD  02/09/12 [phone]

## 2012-02-09 ENCOUNTER — Inpatient Hospital Stay: Payer: Medicare Other

## 2012-02-09 LAB — ECG 12-LEAD
Atrial Rate: 113 {beats}/min
Atrial Rate: 97 {beats}/min
P Axis: 81 degrees
P Axis: 77 degrees
P-R Interval: 138 ms
P-R Interval: 140 ms
Q-T Interval: 368 ms
Q-T Interval: 418 ms
QRS Duration: 118 ms
QRS Duration: 124 ms
QTC Calculation (Bezet): 504 ms
QTC Calculation (Bezet): 530 ms
R Axis: -39 degrees
R Axis: -47 degrees
T Axis: 79 degrees
T Axis: 113 degrees
Ventricular Rate: 113 {beats}/min
Ventricular Rate: 97 {beats}/min

## 2012-02-09 LAB — CBC AND DIFFERENTIAL
Basophils Absolute Automated: 0.02 10*3/uL (ref 0.00–0.20)
Basophils Automated: 0 % (ref 0–2)
Eosinophils Absolute Automated: 0.11 10*3/uL (ref 0.00–0.70)
Eosinophils Automated: 2 % (ref 0–5)
Hematocrit: 44 % (ref 37.0–47.0)
Hgb: 13.4 g/dL (ref 12.0–16.0)
Immature Granulocytes Absolute: 0.01 10*3/uL
Immature Granulocytes: 0 % (ref 0–1)
Lymphocytes Absolute Automated: 1.99 10*3/uL (ref 0.50–4.40)
Lymphocytes Automated: 27 % (ref 15–41)
MCH: 28.2 pg (ref 28.0–32.0)
MCHC: 30.5 g/dL — ABNORMAL LOW (ref 32.0–36.0)
MCV: 92.6 fL (ref 80.0–100.0)
MPV: 11 fL (ref 9.4–12.3)
Monocytes Absolute Automated: 0.75 10*3/uL (ref 0.00–1.20)
Monocytes: 10 % (ref 0–11)
Neutrophils Absolute: 4.58 10*3/uL (ref 1.80–8.10)
Neutrophils: 61 % (ref 52–75)
Nucleated RBC: 0 /100 WBC (ref 0–1)
Platelets: 236 10*3/uL (ref 140–400)
RBC: 4.75 10*6/uL (ref 4.20–5.40)
RDW: 15 % (ref 12–15)
WBC: 7.46 10*3/uL (ref 3.50–10.80)

## 2012-02-09 LAB — LIPID PANEL
Cholesterol / HDL Ratio: 4.6 Index
Cholesterol: 158 mg/dL (ref 0–199)
HDL: 34 mg/dL — ABNORMAL LOW (ref >40)
LDL Calculated: 106 mg/dL — ABNORMAL HIGH (ref 0–99)
Triglycerides: 92 mg/dL (ref 34–149)
VLDL Calculated: 18 mg/dL (ref 10–40)

## 2012-02-09 LAB — BASIC METABOLIC PANEL
BUN: 15 mg/dL (ref 7.0–19.0)
CO2: 29 mEq/L (ref 22–29)
Calcium: 8.4 mg/dL (ref 7.9–10.6)
Chloride: 104 mEq/L (ref 98–107)
Creatinine: 0.7 mg/dL (ref 0.6–1.0)
Glucose: 71 mg/dL (ref 70–100)
Potassium: 4 mEq/L (ref 3.5–5.1)
Sodium: 143 mEq/L (ref 136–145)

## 2012-02-09 LAB — CK: Creatine Kinase (CK): 68 U/L (ref 29–168)

## 2012-02-09 LAB — HEMOLYSIS INDEX: Hemolysis Index: 40 Index — ABNORMAL HIGH (ref 0–9)

## 2012-02-09 LAB — HEMOGLOBIN A1C: Hemoglobin A1C: 6.1 % — ABNORMAL HIGH (ref 0.0–6.0)

## 2012-02-09 LAB — GFR: EGFR: >60

## 2012-02-09 MED ORDER — LISINOPRIL 5 MG PO TABS
5.00 mg | ORAL_TABLET | Freq: Every day | ORAL | Status: DC
Start: 2012-02-09 — End: 2012-02-11
  Administered 2012-02-09 – 2012-02-11 (×3): 5 mg via ORAL
  Filled 2012-02-09 (×3): qty 1

## 2012-02-09 MED ORDER — ALBUTEROL SULFATE 1.25 MG/3ML IN NEBU
1.25 mg | INHALATION_SOLUTION | Freq: Four times a day (QID) | RESPIRATORY_TRACT | Status: DC | PRN
Start: 2012-02-09 — End: 2012-02-14
  Administered 2012-02-09: 1.25 mg via RESPIRATORY_TRACT
  Filled 2012-02-09: qty 3

## 2012-02-09 MED ORDER — TIOTROPIUM BROMIDE MONOHYDRATE 18 MCG IN CAPS
18.00 ug | ORAL_CAPSULE | Freq: Every morning | RESPIRATORY_TRACT | Status: DC
Start: 2012-02-09 — End: 2012-02-14
  Administered 2012-02-10 – 2012-02-14 (×5): 18 ug via RESPIRATORY_TRACT
  Filled 2012-02-09 (×2): qty 5

## 2012-02-09 MED ORDER — BUDESONIDE 180 MCG/ACT IN AEPB
1.00 | INHALATION_SPRAY | Freq: Two times a day (BID) | RESPIRATORY_TRACT | Status: DC
Start: 2012-02-09 — End: 2012-02-12
  Filled 2012-02-09 (×2): qty 120

## 2012-02-09 MED ORDER — FUROSEMIDE 10 MG/ML IJ SOLN
20.00 mg | Freq: Two times a day (BID) | INTRAMUSCULAR | Status: DC
Start: 2012-02-09 — End: 2012-02-09
  Administered 2012-02-09: 20 mg via INTRAVENOUS
  Filled 2012-02-09: qty 4

## 2012-02-09 MED ORDER — ENOXAPARIN SODIUM 40 MG/0.4ML SC SOLN
40.00 mg | Freq: Every evening | SUBCUTANEOUS | Status: DC
Start: 2012-02-09 — End: 2012-02-14
  Administered 2012-02-09 – 2012-02-13 (×6): 40 mg via SUBCUTANEOUS
  Filled 2012-02-09 (×6): qty 0.4

## 2012-02-09 MED ORDER — NITROFURANTOIN MONOHYD MACRO 100 MG PO CAPS
100.00 mg | ORAL_CAPSULE | Freq: Two times a day (BID) | ORAL | Status: DC
Start: 2012-02-09 — End: 2012-02-10
  Administered 2012-02-09 – 2012-02-10 (×3): 100 mg via ORAL
  Filled 2012-02-09 (×7): qty 1

## 2012-02-09 MED ORDER — FUROSEMIDE 10 MG/ML IJ SOLN
80.00 mg | Freq: Two times a day (BID) | INTRAMUSCULAR | Status: DC
Start: 2012-02-09 — End: 2012-02-14
  Administered 2012-02-10 (×2): 80 mg via INTRAVENOUS
  Administered 2012-02-11: 40 mg via INTRAVENOUS
  Administered 2012-02-11 – 2012-02-14 (×6): 80 mg via INTRAVENOUS
  Filled 2012-02-09 (×10): qty 8

## 2012-02-09 MED ORDER — FUROSEMIDE 10 MG/ML IJ SOLN
60.00 mg | Freq: Once | INTRAMUSCULAR | Status: AC
Start: 2012-02-09 — End: 2012-02-09
  Administered 2012-02-09: 60 mg via INTRAVENOUS
  Filled 2012-02-09: qty 8

## 2012-02-09 MED ORDER — NITROFURANTOIN MONOHYD MACRO 100 MG PO CAPS
100.00 mg | ORAL_CAPSULE | Freq: Two times a day (BID) | ORAL | Status: DC
Start: 2012-02-09 — End: 2012-02-09

## 2012-02-09 NOTE — Progress Notes (Signed)
Pt A&Ox3 and able to communicate her needs. Pt resides alone but has son and sister nearby for additional support if needed. Pt ind w/ adls prior to admission. Pt has h/o pul rehab out pt.     [address] family will provide transportation. No needs identified at this time.

## 2012-02-09 NOTE — OT Eval Note (Signed)
[facility]     Occupational Therapy Evaluation     Patient: Sabrina Church    MRN#: [id number]   [facility]  Bed: FI360/FI360-01    Time of treatment: Time Calculation  OT Received On: 02/09/12  Start Time: 1400  Stop Time: 1425  Time Calculation (min): 25 min    Consult received for [name] for OT Evaluation and Treatment.  Patient's medical condition is appropriate for Occupational therapy intervention at this time.    Precautions and Contraindications:   desats w/activity    Medical Diagnosis: CHF (congestive heart failure) [428.0] (chf)  UTI (lower urinary tract infection) [599.0]  Urinary tract infection, site not specified [599.0] ([id number] (congestive heart [phone])    History of Present Illness: Sabrina Church is a 71 y.o. female admitted on 02/08/2012 with progressive DOE and development of LE edema.  Pt initially presented to PCP and sent to [facility]. CXR done at PCP consistent with CHF.            Patient Active Problem List   Diagnosis   . CHF (congestive heart failure), acute, decompensated   . UTI (lower urinary tract infection)   . COPD (chronic obstructive pulmonary disease)        Past Medical/Surgical History:  Past Medical History   Diagnosis Date   . COPD (chronic obstructive pulmonary disease)    . Irregular heart beat       Past Surgical History   Procedure Date   . Hysterectomy    . Proaspe          X-Rays/Tests/Labs:    CARDIAC MARKERS    Troponin I                      0.04          Social History:  Prior Level of Function: Independent ADLs and transfers  Assistive Devices: none  Baseline Activity: community ambulation; drives  DME Currently at Home: shower chair, RTS (all left by husband)  Home Living Arrangements: alone; husband passed away 2 weeks ago  Type of Home: house  Home Layout: 1 level; tub shower      Subjective: Patient is agreeable to participation in the therapy session. Nursing clears patient for therapy.     Patient Goal:  go home  Pain:   Scale: denied    Objective:  Patient received  seated in a bedside chair  with IV, tele, NC O2 in place.    Cognitive Status and Neuro Exam:  Alert, oriented x 3    Musculoskeletal Examination  Gross ROM: B UE WFL  Gross Strength: B UE WFL      Sensory/Oculomotor Examination  Auditory: WFL  Tactile: no c/o numbness UE  Vision: WFL      Activities of Daily Living  Eating: Ind  Grooming: Ind at sink side, standing  Bathing: NT  UE Dressing: Ind  LE Dressing: MI, seated  Toileting: Ind    Functional Mobility:  Supine to Sit: pt reports no difficulties  Sit to Stand: Ind  Transfers: MI     Balance  Static Sitting: good  Dynamic Sitting: good  Static Standing: good  Dynamic Standing: no LOB, able to pick up item from floor    Participation and Activity Tolerance  Participation Effort: good  Endurance: O2 on 2L NC O2 93% prior to activity   O2 on RA after activity 87%, but increased to 89% on  RA, once NC O2 replaced pt w/increased sats to 93%    Treatment Activities: OT eval  Educated the patient to role of occupational therapy, plan of care, goals of therapy and HEP, home safety.      Assessment: Sabrina Church is a 71 y.o. female admitted 02/08/2012. Pt is MI/I w/ADLs and functional transfers. Pt has all DME options at home from when husband used them. Husband recently passes away. Pt has family in area to assist prn. No further OT needs identified. D/C OT.    Plan:   Treatment/Interventions: d/c OT    Risks/benefits/POC discussed w/pt.           Discharge Recommendation: home w/assist prn  DME Recommendation: in place    Signature: Felecia Shelling MS, OTR

## 2012-02-09 NOTE — H&P (Addendum)
ADMISSION HISTORY AND PHYSICAL EXAM    Date Time: 02/09/2012 12:40 AM  Patient Name: Sabrina Church  Attending Physician: Phylliss Blakes, MD  Primary Care Physician: Christa See, MD    CC: DOE, LE edema      Assessment:     Patient Active Problem List    Diagnosis Date Noted   . CHF (congestive heart failure) 02/09/2012   . UTI (lower urinary tract infection) 02/09/2012     Ms Shepler is a 71 yo woman who is a former smoker with hx of well controlled COPD, NSVT post ob in 2011 with new LE edema, DOE, elevated BNP concerning for new CHF on undetermined etiology.    Plan:   # Dyspnea on exertion and LE edema: Most likely due to new onset CHF give JVP, DOE, LE edema, elevated BNP, and congestion on CXR. Etiology of CHF would be unclear as she has no hx of CAD or MI. Also no HTN or DM. Given temporal relation to husbands death can consider stress induced cardiomyopathy. Could relate to COPD but less likely given good air movement on exam and wheezing. Low suspicion for PE at this time.   - Lasix 20mg  IV BID for now  - Strict I/Os, daily weights  - Echo  - Trend trops (neg x1 so far)  - f/u EKG  - Cards consult in am  - Consider ischemic eval pending above  - Lipid panel, A1C for risk stratification    # COPD: appears stable.  - Cont Spiriva, steroid inhaler    # UTI: UA with WBCs, large LE and positive nitrite. Asymptomatic.   - Was started on macrobid at outside facility. Will continue for now.  - f/u culture    # DVT prophy: lovenox      Disposition:     Anticipated medical stability for discharge: October,  9 - Afternoon  Service status: Inpatient: risk of morbidity and mortality and risk of progressive disease  Reason for ongoing hospitalization: monitoring  Anticipated discharge needs: none      History of Presenting Illness:   Sabrina Church is a 70 y.o. female who is a former smoker with hx of well controlled COPD, NSVT post op in 2011 who was transferred to [facility] from [place] for further  management. Essentially she has been in good health, thought not following up with doctors regularly due to stress and increased time dealing with husbands illness until 2 weeks ago. At that time her husband died and she notes development on LE edema and progressive dyspnea on exertion. Denies orthopnea or PND, though says she does not sleep. Denies chest pain or palpitations. She denies any change in diet or new medications.     She went to PCP today and was sent to [facility]. CXR was done there and found to be consistent with CHF. She was give lasix 20 IV, O2 and aspirin. She reports feeling much better at this time.    No NVDC, no dysuria, hematuria or frequency. No headache. No muscle pain.    Past Medical History:     Past Medical History   Diagnosis Date   . COPD (chronic obstructive pulmonary disease)    . Irregular heart beat        Available old records reviewed, including:  notes    Past Surgical History:     Past Surgical History   Procedure Date   . Hysterectomy    . Proaspe  Family History:     Family History   Problem Relation Age of Onset   . Heart disease Mother    . Heart disease Father    . Heart disease Sister        Social History:     History   Smoking status   . Former Smoker -- 1.0 packs/day for 40 years   . Quit date: 12/08/2009   Smokeless tobacco   . Not on file     History   Alcohol Use No     History   Drug Use        Allergies:     Allergies   Allergen Reactions   . Ceclor (Cefaclor) Hives       Medications:     Prior to Admission medications    Medication Sig Start Date End Date Taking? Authorizing Provider   fluticasone (FLOVENT DISKUS) 50 MCG/BLIST diskus inhaler Inhale 250 mcg into the lungs daily.   Yes [provider]   levalbuterol (XOPENEX HFA) 45 MCG/ACT inhaler Inhale 1-2 puffs into the lungs every 4 (four) hours as needed.   Yes [provider]   tiotropium (SPIRIVA) 18 MCG inhalation capsule Place 18 mcg into inhaler and inhale daily.   Yes [provider]       Review of Systems:   All other systems were reviewed and are negative except: per HPI    Physical Exam:   Patient Vitals for the past 24 hrs:   BP Temp Pulse Resp SpO2 Height Weight   02/08/12 2348 - - - - 93 % - -   02/08/12 2300 98/86 mmHg 95.2 F (35.1 C) 98  16  89 % 1.6 m (5\' 3" ) 89.858 kg (198 lb 1.6 oz)     Body mass index is 35.09 kg/(m^2).  No intake or output data in the 24 hours ending 02/09/12 0040    General: awake, alert, oriented x 3; no acute distress.  HEENT: perrla, eomi, sclera anicteric  oropharynx clear without lesions, mucous membranes moist  Neck: supple, no lymphadenopathy, no thyromegaly, no carotid bruits. JVP 5cm.  Cardiovascular: regular rate and rhythm, no murmurs, rubs or gallops  Lungs: bilateral crackles. No wheezing.  Abdomen: soft, non-tender, non-distended; no palpable masses, no hepatosplenomegaly, normoactive bowel sounds, no rebound or guarding  Extremities:2+ bilateral pitting edema to below the knee  Neuro: cranial nerves grossly intact, strength 5/5 in upper and lower extremities, sensation intact,   Skin: no rashes or lesions noted      Labs:     Results     ** No Results found for the last 24 hours. **      Labs from outside facility reviewed.    Imaging personally reviewed, including: CXR    Safety Checklist  DVT prophylaxis:  CHEST guideline (See page [id number]) Chemical   Foley: Not present   IVs:  Peripheral IV   PT/OT: Ordered       Signed by: Jerre Simon, MD   Cc: Lise Auer, MD      Attending Attestation:     I have seen and personally examined the patient.  I agree with the findings and exam as documented by Dr. Shann Medal with the following caveats:   - new onset CHF R/O ACS with multiple underlying risk factors including avid smoking with COPD  - echo ordered  - Lasix 20mg  IV q12h for now  - card consult to be called today.  Disposition:     Today's date: 02/09/2012  Anticipated medical stability for discharge: 10/11-10/12/13  Service  status: inpt.  Reason for ongoing hospitalization: CHF new onset  Anticipated discharge needs: PMD, cards f/up    Vishal Chrisandra Carota, MD      I reevaluated patient this morning. She is doing well well. No complaints. Breathing comfortably.    Wt down 3 pounds  Vitals remain stable.  Labs unchanged    Exam with faint crackles at lung bases, decreased JVP, decreased LE edema.    Plan to continue plan as outline in H and P.    Will discuss with Dr Randel Books this morning.

## 2012-02-09 NOTE — Plan of Care (Signed)
STATUS:  Pt states she feels much better now than at PMD office.  Denies symptoms of UTI despite postive UTI.  Tele=ST HR=104; Ambulating ad lib in room.    PLAN:  Echo in am.  Continue to monitor pt per unit protocol.

## 2012-02-09 NOTE — Progress Notes (Addendum)
Pt remained stable during shift. No c/o pain.  Some SOB with exertion, SPO2 decreased to 80 w/ OT, went back up to 90'2 w/ O2 NC. Edema LE bilat, Lasix increased to 80 mL BID, first dose given at 1325 w/ output of 2000 by 1830.  BP now 97/53 - t/c to night physician re Lasix dose at 1800.  To be held as pt feeling dry and low BP.  SR and BBB on tele.

## 2012-02-09 NOTE — Consults (Addendum)
[place] HEART CARDIOLOGY CONSULTATION REPORT  [facility]    Date Time: 02/09/2012 12:25 PM  Patient Name: Sabrina Church  Requesting Physician: Jimmey Ralph, MD       Reason for Consultation:   Shortness of breath      History:   Sabrina Church is a 71 y.o. female admitted on 02/08/2012, for whom we are asked to provide cardiac consultation, regarding shortness of breath.  She was in her usual state of health until about 2 weeks ago when her husband passed away.  Shortly thereafter, she developed progressively worsening exertional dyspnea associated with paroxysmal nocturnal dyspnea, orthopnea, abdominal distension, and lower extremity edema.  She denies any associated CP or palpitations. She went to her PCP and was noted to have evidence of pulmonary edema on CXR for which she was admitted for further management. BNP level was 2230 on admission and she was noted to have a UTI.     The patient follows at [facility] with Dr. Particia Lather for her frequent PVC's and prior SVT, and has also seen Dr. Fidel Levy as well.      Past Medical History:     Past Medical History   Diagnosis Date   . COPD (chronic obstructive pulmonary disease)    . Irregular heart beat        Past Surgical History:     Past Surgical History   Procedure Date   . Hysterectomy    . Proaspe        Family History:     Family History   Problem Relation Age of Onset   . Heart disease Mother    . Heart disease Father    . Heart disease Sister        Social History:     History     Social History   . Marital Status: Widowed     Spouse Name: N/A     Number of Children: N/A   . Years of Education: N/A     Social History Main Topics   . Smoking status: Former Smoker -- 1.0 packs/day for 40 years     Quit date: 12/08/2009   . Smokeless tobacco: Not on file   . Alcohol Use: No   . Drug Use:    . Sexually Active:      Other Topics Concern   . Not on file     Social History Narrative   . No narrative on file       Allergies:     Allergies    Allergen Reactions   . Ceclor (Cefaclor) Hives       Medications:     Prescriptions prior to admission   Medication Status Sig   . fluticasone (FLOVENT DISKUS) 50 MCG/BLIST diskus inhaler Active Inhale 250 mcg into the lungs daily.   Marland Kitchen levalbuterol (XOPENEX HFA) 45 MCG/ACT inhaler Active Inhale 1-2 puffs into the lungs every 4 (four) hours as needed.   . tiotropium (SPIRIVA) 18 MCG inhalation capsule Active Place 18 mcg into inhaler and inhale daily.      Current Facility-Administered Medications   Medication Status Dose Route Frequency   . budesonide Active  1 puff Inhalation BID   . enoxaparin Active  40 mg Subcutaneous QHS   . furosemide Active  20 mg Intravenous BID   . nitrofurantoin (macrocrystal-monohydrate) Active  100 mg Oral Q12H SCH   . tiotropium Active  18 mcg Inhalation QAM   . DISCONTD: nitrofurantoin (macrocrystal-monohydrate) Discontinued  100  mg Oral Q12H [facility]         Review of Systems:    Comprehensive review of systems including constitutional, eyes, ears, nose, mouth, throat, cardiovascular, GI, GU, musculoskeletal, integumentary, respiratory, neurologic, psychiatric, and endocrine is negative other than what is mentioned already in the history of present illness    Physical Exam:     Filed Vitals:    02/09/12 1116   BP: 118/73   Pulse: 83   Temp: 96 F (35.6 C)   Resp: 18   SpO2: 94%     Temp (24hrs), Avg:96.7 F (35.9 C), Min:95.2 F (35.1 C), Max:97.9 F (36.6 C)      Intake and Output Summary (Last 24 hours) at Date Time    Intake/Output Summary (Last 24 hours) at 02/09/12 1225  Last data filed at 02/09/12 0600   Gross per 24 hour   Intake      0 ml   Output    400 ml   Net   -400 ml       GENERAL: Patient is in no acute distress   HEENT: No scleral icterus or conjunctival pallor, moist mucous membranes   NECK: elevated jugular venous pressure of 10-12cm H2O, no thyromegaly, normal carotid upstrokes without bruits   CARDIAC: Normal apical impulse, regular rate and rhythm, with normal S1  and S2, and no murmurs, rubs, or gallops   CHEST: bibasilar rales, normal respiratory effort  ABDOMEN: No abdominal bruits, masses, or hepatosplenomegaly, nontender, non-distended, good bowel sounds   EXTREMITIES: No clubbing or cyanosis, 2+ bilateral pitting edema, 2+ DP, PT, and femoral pulses bilaterally without bruits  SKIN: No rash or jaundice   NEUROLOGIC: Alert and oriented to time, place and person, normal mood and affect   MUSCULOSKELETAL: Normal muscle strength and tone.      Labs Reviewed:     Lab 02/09/12 0430 02/08/12 1806   CK 68 --   TROPI -- 0.04   TROPT -- --   CKMBINDEX -- --     No results found for this basename: DIG in the last 168 hours    Lab 02/09/12 0430   CHOL 158   TRIG 92   HDL 34*   LDL 106*       Lab 02/08/12 1806   BILITOTAL 0.8   BILIDIRECT --   PROT 6.1   ALB 3.4*   ALT 49   AST 44*     No results found for this basename: MG in the last 168 hours  No results found for this basename: PT,INR,PTT in the last 168 hours    Lab 02/09/12 0430 02/08/12 1716   WBC 7.46 8.69   HGB 13.4 13.8   HCT 44.0 43.3   PLT 236 260       Lab 02/09/12 0430 02/08/12 1806   NA 143 141   K 4.0 4.4   CL 104 104   CO2 29 25   BUN 15.0 18.0   CREAT 0.7 0.7   EGFR >60.0 >60.0   GLU 71 99   CA 8.4 9.1     EKG: sinus tachycardia with old RBBB and left axis deviation, LVH.       Radiology   Radiological Procedure reviewed.      chest X-ray: bilateral pulmonary edema  Assessment:    Acute congestive heart failure exacerbation (unclear yet whether systolic or diastolic since echo pending)   UTI   History of self-limited SVT, November 2011, without recurrence.   History  of asymptomatic frequent PVCs treated conservatively   Low-normal EF of 50% to 55% by 06/09/2010 echo   Normal myocardial perfusion, December 2011.   Chronic bifascicular block.   Remote history of syncope.   COPD/asthma, with remote tobacco use.     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   She remains very volume overloaded clinically, so would increase lasix to 80mg  IV BID.   Start lisinopril 5mg  PO QD for CHF   If more euvolemic, can consider starting beta blocker tomorrow, but will need to watch for wheezing in view of her COPD/asthma history.   Echocardiogram pending to reassess LV function.     Signed by: Donnie Mesa, MD    [facility]  NP Spectralink [phone] (8am-5pm)  MD Spectralink [phone] or [phone] (8am-5pm)  Arrhythmia Spectralink [phone] (8am-4:30pm)  After hours, non urgent consult line [phone]  After Hours, urgent consults [phone]

## 2012-02-09 NOTE — PT Eval Note (Signed)
[facility]     Physical Therapy Evaluation/Discharge     Patient: Sabrina Church    MRN#: [id number]   [facility]  Bed: FI360/FI360-01    Time of treatment: Time Calculation  PT Received On: 02/09/12  Start Time: 1140  Stop Time: 1210  Time Calculation (min): 30 min    Consult received for Sabrina Church for PT Evaluation and Treatment.  Patient's medical condition is appropriate for Physical Therapy evaluation at this time.    Precautions and Contraindications:   Falls; desats with activity     Medical Diagnosis: CHF (congestive heart failure) [428.0] (chf)  UTI (lower urinary tract infection) [599.0]  Urinary tract infection, site not specified [599.0] ([id number] (congestive heart [phone])    History of Present Illness: Sabrina Church is a 71 y.o. female admitted on 02/08/2012 from [place] with progressive DOE and development of LE edema.  Pt initially presented to PCP and sent to [facility]. CXR done at PCP consistent with CHF.     Patient Active Problem List   Diagnosis   . CHF (congestive heart failure), acute, decompensated   . UTI (lower urinary tract infection)   . COPD (chronic obstructive pulmonary disease)        Past Medical/Surgical History:  Past Medical History   Diagnosis Date   . COPD (chronic obstructive pulmonary disease)    . Irregular heart beat       Past Surgical History   Procedure Date   . Hysterectomy    . Proaspe          X-Rays/Tests/Labs:  CXR 10/7  CHF with bilateral pleural effusions.        Social History:  Prior Level of Function: Independent  Assistive Devices: None  Home Living Arrangements: Alone; husband passed away 2 weeks ago  Type of Home: House  Home Layout: 1 level; 1 inch step to enter    Subjective: Patient is agreeable to participation in the therapy session. Nursing clears patient for therapy.     Patient Goal: Get better    Pain:   Scale: 0/10      Objective:  Patient received  in bed  with telemetry, PIV access, 2L O2 via  NC in place.    Cognitive Status and Neuro Exam:  Alert and oriented x 3; very pleasant and cooperative     Musculoskeletal Examination  RUE ROM: WFL  LUE ROM: WFL  RLE ROM: WFL  LLE ROM: WFL    RUE Strength: WFL  LUE Strength: WFL  RLE Strength: WFL  LLE Strength: WFL      Functional Mobility  Rolling: independent  Supine to Sit: independent  Scooting: independent  Sit to Stand: independent  Stand to Sit: independent  Transfers: independent    Ambulation  Level of assistance required: independent  Ambulation Distance: 300 feet  Pattern: WFL  Device Used: None  Stair Management: NT     Balance  Static Sitting: Good  Dynamic Sitting: Good  Static Standing: Good  Dynamic Standing: Good    Participation and Activity Tolerance  Participation Effort: Good  Endurance: Fair, SpO2 80% on RA after 300 feet of ambulation; improved to 94% on 2L O2 with rest. RN notified     Treatment Activities: evaluation  Educated the patient to role of physical therapy, plan of care, goals of therapy and safety with mobility and ADLs, discharge instructions, home safety.      Assessment: Sabrina Church is  a 71 y.o. female admitted 02/08/2012 presents at functional baseline, independent with basic mobility skills.  No acute PT needs identified. D/C acute PT services.     Plan: D/C acute PT services  Therapy Diagnosis: Impaired mobility    Discharge Recommendation: Home with assist PRN  DME Recommendation: None    Eduard Roux, PT, DPT  Pager [phone]

## 2012-02-10 LAB — CBC AND DIFFERENTIAL
Basophils Absolute Automated: 0.01 10*3/uL (ref 0.00–0.20)
Basophils Automated: 0 % (ref 0–2)
Eosinophils Absolute Automated: 0.3 10*3/uL (ref 0.00–0.70)
Eosinophils Automated: 3 % (ref 0–5)
Hematocrit: 41.5 % (ref 37.0–47.0)
Hgb: 13 g/dL (ref 12.0–16.0)
Immature Granulocytes Absolute: 0.01 10*3/uL
Immature Granulocytes: 0 % (ref 0–1)
Lymphocytes Absolute Automated: 0.92 10*3/uL (ref 0.50–4.40)
Lymphocytes Automated: 10 % — ABNORMAL LOW (ref 15–41)
MCH: 29.1 pg (ref 28.0–32.0)
MCHC: 31.3 g/dL — ABNORMAL LOW (ref 32.0–36.0)
MCV: 93 fL (ref 80.0–100.0)
MPV: 10.7 fL (ref 9.4–12.3)
Monocytes Absolute Automated: 0.98 10*3/uL (ref 0.00–1.20)
Monocytes: 11 % (ref 0–11)
Neutrophils Absolute: 6.68 10*3/uL (ref 1.80–8.10)
Neutrophils: 75 % (ref 52–75)
Nucleated RBC: 0 /100 WBC (ref 0–1)
Platelets: 212 10*3/uL (ref 140–400)
RBC: 4.46 10*6/uL (ref 4.20–5.40)
RDW: 15 % (ref 12–15)
WBC: 8.9 10*3/uL (ref 3.50–10.80)

## 2012-02-10 LAB — BASIC METABOLIC PANEL
BUN: 13 mg/dL (ref 7.0–19.0)
CO2: 31 mEq/L — ABNORMAL HIGH (ref 22–29)
Calcium: 8.1 mg/dL (ref 7.9–10.6)
Chloride: 101 mEq/L (ref 98–107)
Creatinine: 0.7 mg/dL (ref 0.6–1.0)
Glucose: 108 mg/dL — ABNORMAL HIGH (ref 70–100)
Potassium: 3.8 mEq/L (ref 3.5–5.1)
Sodium: 141 mEq/L (ref 136–145)

## 2012-02-10 LAB — GFR: EGFR: >60

## 2012-02-10 LAB — CK: Creatine Kinase (CK): 54 U/L (ref 29–168)

## 2012-02-10 MED ORDER — CARVEDILOL 3.125 MG PO TABS
3.1250 mg | ORAL_TABLET | Freq: Two times a day (BID) | ORAL | Status: DC
Start: 2012-02-11 — End: 2012-02-12
  Administered 2012-02-11 – 2012-02-12 (×3): 3.125 mg via ORAL
  Filled 2012-02-10 (×3): qty 1

## 2012-02-10 NOTE — Progress Notes (Signed)
[place] HEART PROGRESS NOTE  [facility]      Date Time: 02/10/2012 10:47 AM  Patient Name: Sabrina Church, Sabrina Church  Medical Record #:  [id number]  Account#:  [id number]  Admission Date:  02/08/2012         Patient Active Problem List   Diagnosis   . CHF (congestive heart failure), acute, decompensated   . COPD (chronic obstructive pulmonary disease)   . Stress-induced cardiomyopathy   . Low HDL (under 40)       Subjective:   Mildly short of breath      Assessment:        Acute congestive heart failure exacerbation systolic failure ef 35 %  History of self-limited SVT, November 2011, without recurrence.   History of asymptomatic frequent PVCs treated conservatively   Low-normal EF of 50% to 55% by 06/09/2010 echo   Normal myocardial perfusion, December 2011.   Chronic bifascicular block.   Remote history of syncope.   COPD/asthma, with remote tobacco use.         Recommendations:      Would continue diuresis. Need bnp to be around 250 or below before discharge. Anticipate discharge not until fri or sat. Begin tomorrow coreg and watch for wheezing. Continue diuresis add aldactone eventually  Agree with Dr Nash Shearer likely stress induced myopathy but will do thallium Friday.        Medications:      Scheduled Meds:         budesonide Active 1 puff Inhalation BID   carvedilol Active 3.125 mg Oral Q12H SCH   enoxaparin Active 40 mg Subcutaneous QHS   furosemide Completed 60 mg Intravenous Once   furosemide Active 80 mg Intravenous BID   lisinopril Active 5 mg Oral Daily   tiotropium Active 18 mcg Inhalation QAM   DISCONTD: furosemide Discontinued 20 mg Intravenous BID   DISCONTD: nitrofurantoin (macrocrystal-monohydrate) Discontinued 100 mg Oral Q12H SCH       Continuous Infusions:            Physical Exam:     Filed Vitals:    02/10/12 0758   BP: 116/59   Pulse: 73   Temp: 98.1 F (36.7 C)   Resp: 16   SpO2: 92%     Temp (24hrs), Avg:96.8 F (36 C), Min:95.5 F (35.3 C), Max:98.1 F (36.7 C)    GENERAL: Patient is in no  acute distress   HEENT: No scleral icterus or conjunctival pallor, moist mucous membranes   NECK: elevated jugular venous pressure of 10-12cm H2O, no thyromegaly, normal carotid upstrokes without bruits   CARDIAC: Normal apical impulse, regular rate and rhythm, with normal S1 and S2, and no murmurs, rubs, or gallops   CHEST: bibasilar rales, normal respiratory effort   ABDOMEN: No abdominal bruits, masses, or hepatosplenomegaly, nontender, non-distended, good bowel sounds   EXTREMITIES: No clubbing or cyanosis, 2+ bilateral pitting edema, 2+ DP, PT, and femoral pulses bilaterally without bruits   SKIN: No rash or jaundice   NEUROLOGIC: Alert and oriented to time, place and person, normal mood and affect   MUSCULOSKELETAL: Normal muscle strength and tone.     Telemetry reviewed no changes.     Intake and Output Summary (Last 24 hours) at Date Time    Intake/Output Summary (Last 24 hours) at 02/10/12 1047  Last data filed at 02/10/12 0600   Gross per 24 hour   Intake    200 ml   Output   2650 ml   Net  -  2450 ml       General Appearance:  Breathing comfortable, no acute distress  Head:  normocephalic  Eyes:  EOM's intact  Neck:  No carotid bruit or jugular venous distension, brisk carotid upstroke  Lungs:  Clear to auscultation throughout, no wheezes, rhonchi or rales, good respiratory effort   Heart:  S1, S2 normal, no S3, no S4, no murmur, PMI not displaced, no rub   Abdomen:  Soft, non-tender, positive bowel sounds, no hepatojugular reflux  Extremities:  No cyanosis, clubbing or edema  Pulses:  Equal pulses, 4/4 symmetric  Neurologic:  Alert and oriented x3, mood and affect normal    Labs:     Lab 02/10/12 0418 02/09/12 0430 02/08/12 1806   CK 54 68 --   TROPI -- -- 0.04   TROPT -- -- --   CKMBINDEX -- -- --     No results found for this basename: DIG in the last 168 hours    Lab 02/09/12 0430   CHOL 158   TRIG 92   HDL 34*   LDL 106*       Lab 02/08/12 1806   BILITOTAL 0.8   BILIDIRECT --   PROT 6.1   ALB 3.4*    ALT 49   AST 44*     No results found for this basename: MG in the last 168 hours  No results found for this basename: PT,INR,PTT in the last 168 hours    Lab 02/10/12 0418 02/09/12 0430 02/08/12 1716   WBC 8.90 7.46 8.69   HGB 13.0 13.4 13.8   HCT 41.5 44.0 43.3   PLT 212 236 260       Lab 02/10/12 0418 02/09/12 0430 02/08/12 1806   NA 141 143 141   K 3.8 4.0 4.4   CL 101 104 104   CO2 31* 29 25   BUN 13.0 15.0 18.0   CREAT 0.7 0.7 0.7   EGFR >60.0 >60.0 >60.0   GLU 108* 71 99   CA 8.1 8.4 9.1     No results found for this basename: TSH,FREET3,FREET4 in the last 168 hours    .  Lab Results   Component Value Date    BNP 2230* 02/08/2012        Imaging:   Radiological Procedure reviewed.        Signed by: Amedeo Gory, MD      [facility]  NP Spectralink [phone] (8am-5pm)  MD Spectralink [phone] or [phone] (8am-5pm)  Arrhythmia Spectralink [phone] (8am-4:30pm)  After hours, non urgent consult line [phone]  After Hours, urgent consults [phone]

## 2012-02-10 NOTE — Progress Notes (Signed)
Quick Note:    Negative culture. No follow-up needed.  ______

## 2012-02-10 NOTE — Progress Notes (Addendum)
MEDICINE PROGRESS NOTE    Date Time: 02/10/2012 7:20 AM  Patient Name: Sabrina Church  Attending Physician: Jimmey Ralph, MD      Assessment:     Active Hospital Problems    Diagnosis   . CHF (congestive heart failure), acute, decompensated       . 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  - Cont Spiriva, steroid inhaler    # UTI: UA with WBCs, large LE and positive nitrite. Asymptomatic.   - Was started on macrobid at outside facility. Will continue for now, last dose today (3d)    # DVT prophy: lovenox    Full code    Safety Checklist:     DVT prophylaxis:  CHEST guideline (See page e199S) Chemical   Foley: Not present   IVs:  Continue   PT/OT: Ordered       Disposition:     Anticipated discharge needs: Home w assisstance      Subjective     CC: CHF (congestive heart failure)    Interval History/24 hour events: NAE, PM lasix held given slight hypotension and clinical s/s hypovolemia    HPI/Subjective: NAE, stable SOB.  Tolerating PO, OOB ambulating. No Cp, N, V, F or chills    Review of Systems:   Review of Systems - as above    Physical Exam:   Patient Vitals for the past 24 hrs:   BP Temp Temp src Pulse Resp SpO2   02/10/12 0414 102/52 mmHg 97.3 F (36.3 C) - 89  18  90 %   02/09/12 2348 102/45 mmHg 97.3 F (36.3 C) - 102  18  91 %   02/09/12 1944 111/63 mmHg 95.5 F (35.3 C) - 105  20  98 %   02/09/12 1551 97/53 mmHg 96.6 F (35.9 C) Oral 89  18  93 %   02/09/12 1116 118/73 mmHg 96 F (35.6 C)  Oral 83  18  94 %   02/09/12 0850 - - - - 18  -     Body mass index is 34.54 kg/(m^2).    Intake/Output Summary (Last 24 hours) at 02/10/12 0720  Last data filed at 02/10/12 0600   Gross per 24 hour   Intake    200 ml   Output   2650 ml   Net  -2450 ml       General: NAD AAOx3  Cardiovascular: RRR S1/S2  Lungs: BB crackles  Abdomen: soft, NT, ND  Extremities: 1+ BLE edema  Other: no rashes    Meds:     Medications were reviewed.    Labs:     Recent Labs   Basename 02/10/12 0418 02/09/12 0430    WBC 8.90 7.46    HGB 13.0 13.4    HCT 41.5 44.0    PLT 212 236    MCV 93.0 92.6  Recent Labs   [facility] 02/10/12 0418 02/09/12 0430    NA 141 143    K 3.8 4.0    CL 101 104    CO2 31* 29    BUN 13.0 15.0    CREAT 0.7 0.7    GLU 108* 71    CA 8.1 8.4    MG -- --    PHOS -- --       Recent Labs   [facility] 02/08/12 1806    AST 44*    ALT 49    ALKPHOS 85    PROT 6.1    ALB 3.4*   HgbA1C: 6.1%    TC: 158, HDL: 34, LDL:106, TG: 92, VLDL: 18    Imaging personally reviewed, including: 2D echo: LVEF 35% global hypokinesis and grade 1 diastolic dysfuntion    Signed by: Kathe Mariner, MD      Attending Attestation:   CC FU CHF  I have seen and personally examined the patient.  I agree with the findings and exam as documented by Dr. Delbert Phenix with following caveats:   Urinating quite a bit. No fevers, chills, nausea or CP. Still feels congested and SOB.     PM Lasix dose held for hypotension  NS VTach x 13 beats on Tele over night    10/7 UC > 1,000 CFU, no further work up    2D echo  Ejection fraction was estimated to be 35 %. There was moderate diffuse   hypokinesis. Wall thickness was normal. Doppler: Doppler parameters were   consistent with abnormal left ventricular relaxation (grade 1 diastolic   dysfunction).    71 yo F  1. Stress induced cardiomyopathy:   Acute Systolic HF (decompendated) with EF 35%  2. Contraction alkalosis  3. Low HDL     Plan:  - [place] daily CBC  -Pt does not have UTI.  Start Macrobid today as UC negative  -  c/w I/O, daily weights. CW diuresis as pt still with significant volume overload and ongoing hypoxia. Will eventually begin BB when more euvolemic  - rec lifestyle modification for HDL elevation    Disposition:     See separate note    Jimmey Ralph, MD

## 2012-02-10 NOTE — Plan of Care (Signed)
STATUS:  Pt's only complaint is tenderness on left wrist where saline lock sits. Flushed well.  Tele showed 13 bt run of V-tach while pt sleeping.  Otherwise, tele=ST with frequent PVC's.  HR=107. Bilateral LOEX edema decreasing.    PLAN:  Continue to monitor pt per unit and heart failure protocols.

## 2012-02-10 NOTE — Progress Notes (Addendum)
Attending Attestation:   CC FU CHF  I have seen and personally examined the patient.  I agree with the findings and exam as documented by Dr. Delbert Phenix with following caveats: **    PM Lasix dose held for hypotension  NS VTach x 13 beats on Tele over night    10/7 UC > 1,000 CFU, no further work up    2D echo  Ejection fraction was estimated to be 35 %. There was moderate diffuse   hypokinesis. Wall thickness was normal. Doppler: Doppler parameters were   consistent with abnormal left ventricular relaxation (grade 1 diastolic   dysfunction).    71 yo F  1. Stress induced cardiomyopathy  2. Contraction alkalosis  3. Low HDL     Plan:  - [place] daily CBC  - [place] Macrobid today as UC negative  - c/w I/O, daily weights. CW diuresis  - rec lifestyle modification for HDL elevation    Disposition:     Today's date: 02/10/2012  Anticipated medical stability for discharge: October,  11 - Afternoon  Service status: Inpatient: risk of morbidity and mortality  Reason for ongoing hospitalization: volume overload, ongoing hypoxia  Anticipated discharge needs: cards fu    Jimmey Ralph, MD

## 2012-02-11 LAB — BASIC METABOLIC PANEL
BUN: 10 mg/dL (ref 7.0–19.0)
CO2: 37 mEq/L — ABNORMAL HIGH (ref 22–29)
Calcium: 8.2 mg/dL (ref 7.9–10.6)
Chloride: 98 mEq/L (ref 98–107)
Creatinine: 0.7 mg/dL (ref 0.6–1.0)
Glucose: 98 mg/dL (ref 70–100)
Potassium: 3.5 mEq/L (ref 3.5–5.1)
Sodium: 141 mEq/L (ref 136–145)

## 2012-02-11 LAB — MAGNESIUM: Magnesium: 1.7 mg/dL (ref 1.6–2.6)

## 2012-02-11 LAB — GFR: EGFR: >60

## 2012-02-11 MED ORDER — SPIRONOLACTONE 25 MG PO TABS
25.00 mg | ORAL_TABLET | Freq: Every day | ORAL | Status: DC
Start: 2012-02-11 — End: 2012-02-14
  Administered 2012-02-11 – 2012-02-14 (×4): 25 mg via ORAL
  Filled 2012-02-11 (×4): qty 1

## 2012-02-11 MED ORDER — MAGNESIUM SULFATE IN D5W 10-5 MG/ML-% IV SOLN
INTRAVENOUS | Status: AC
Start: 2012-02-11 — End: 2012-02-11
  Administered 2012-02-11: 1 g
  Filled 2012-02-11: qty 100

## 2012-02-11 MED ORDER — LISINOPRIL 5 MG PO TABS
2.50 mg | ORAL_TABLET | Freq: Every day | ORAL | Status: DC
Start: 2012-02-12 — End: 2012-02-14
  Administered 2012-02-12 – 2012-02-14 (×3): 2.5 mg via ORAL
  Filled 2012-02-11 (×3): qty 1

## 2012-02-11 MED ORDER — MAGNESIUM SULFATE IN D5W 10-5 MG/ML-% IV SOLN
1.0000 g | INTRAVENOUS | Status: AC
Start: 2012-02-11 — End: 2012-02-11
  Administered 2012-02-11: 1 g via INTRAVENOUS
  Filled 2012-02-11: qty 100

## 2012-02-11 NOTE — Plan of Care (Addendum)
Pt with VT runs-MG level 1.7-pt on IV lasix.  Magnesium ordered and 1st bag magnesium 1 gm IV overrode and given.

## 2012-02-11 NOTE — Plan of Care (Signed)
Pt is Sabrina Church, vss, sr on tele, no vtach so far this shift.  Diuresing well.  Pt tracking I/O's.  Discussed plan at home to successfully manage heart failure.  Discussed diet, daily weights, med compliance, exercise and when to call the doctor.  Pt also spoke very freely about the death of her husband and its huge impact on her life.  Recommended a widower support group in the area.  She seemed interested in pursuing.  Patient has no SI, just concerns about her "new normal" without her husband. Pt has a strong family support structure.  She does express gratitude that his death was peaceful and he was "ready".   Will continue to closely monitor vs, labs, tele, I/O's and provide emotional support and education when needed.

## 2012-02-11 NOTE — Progress Notes (Addendum)
MEDICINE PROGRESS NOTE    Date Time: 02/11/2012 7:10 AM  Patient Name: Sabrina Church  Attending Physician: Jimmey Ralph, MD      Assessment:     Active Hospital Problems    Diagnosis   . CHF (congestive heart failure), acute, decompensated       . COPD (chronic obstructive pulmonary disease)     Sabrina Church is a 71 yo F who is a former smoker with h/o well-controlled COPD,  P/w acute HF c/w Stress-induced CM    Plan:   # Stress-induced CM a.k.a Takotsubo CM  - Lasix 80mg  IV BID   - Strict I/Os, daily weights  - Echo w global hypokinesis, LVEF of 35% and grade 1 diastolic dysfunction  - Lisinopril added for HF   - Carveldilol 3.125mg  PO BID    #Contraction Alkalosis, bicarb of 37 up from 31 yest  - continue diuresis but w close monitoring of bicarb  - CTM    #Low HDL  -reccomended diet modifications    # COPD: appears stable.  - Cont Spiriva, steroid inhaler    # DVT prophy: lovenox    Full code    Safety Checklist:     DVT prophylaxis:  CHEST guideline (See page e199S) Chemical   Foley: Not present   IVs:  Continue   PT/OT: Ordered       Disposition:     Anticipated discharge needs: Home w assisstance      Subjective     CC: CHF (congestive heart failure)    Interval History/24 hour events: NAE, discussed joining widow support group overnight with RN    HPI/Subjective: NAE, stable SOB.  Tolerating PO, OOB ambulating. No Cp, N, V, F or chills.  Would like to go home    Review of Systems:   Review of Systems - as above    Physical Exam:     Patient Vitals for the past 24 hrs:   BP Temp Temp src Pulse Resp SpO2   02/11/12 0422 106/54 mmHg 96.2 F (35.7 C) Axillary 76  18  85 %   02/10/12 2358 99/53 mmHg 96.5 F (35.8 C) Oral 75  16  92 %   02/10/12 2235 114/65 mmHg 97.2 F (36.2 C) - 63  18  95 %   02/10/12 1536 138/73 mmHg 97.4 F (36.3 C) Oral 100  18  94 %   02/10/12 1202 122/55 mmHg 97.4 F (36.3 C) Oral 92  18  90 %   02/10/12 0807 - - - - 20  -   02/10/12 0758 116/59 mmHg 98.1 F (36.7 C) - 73  16  92  %     Body mass index is 34.10 kg/(m^2).    Intake/Output Summary (Last 24 hours) at 02/11/12 0710  Last data filed at 02/10/12 2200   Gross per 24 hour   Intake   4650 ml   Output   2600 ml   Net   2050 ml       General: NAD AAOx3  Cardiovascular: RRR S1/S2  Lungs: BB crackles  Abdomen: soft, NT, ND  Extremities: 1+ BLE edema, stable  Other: no rashes    Meds:     Medications were reviewed.    Labs:     Recent Labs   Basename 02/10/12 0418 02/09/12 0430    WBC 8.90 7.46    HGB 13.0 13.4    HCT 41.5 44.0    PLT 212 236    MCV 93.0  92.6       Recent Labs   Basename 02/11/12 0424 02/10/12 0418    NA 141 141    K 3.5 3.8    CL 98 101    CO2 37* 31*    BUN 10.0 13.0    CREAT 0.7 0.7    GLU 98 108*    CA 8.2 8.1    MG -- --    PHOS -- --       Recent Labs   [facility] 02/08/12 1806    AST 44*    ALT 49    ALKPHOS 85    PROT 6.1    ALB 3.4*   HgbA1C: 6.1%    TC: 158, HDL: 34, LDL:106, TG: 92, VLDL: 18    Imaging personally reviewed, including: 2D echo: LVEF 35% global hypokinesis and grade 1 diastolic dysfuntion    Signed by: Kathe Mariner, MD      Attending Attestation:   CC fu CHF  I have seen and personally examined the patient on AM rounds with the team.  I agree with the findings and exam as documented by Dr. Delbert Phenix with following caveats:No fevers, chills, nausea. Breathing improved. Still with LE swelling. Still wearing 02. Able to walk around the room    71 yo F   1. Stress induced cardiomyopathy:   Acute Systolic HF (decompendated) with EF 35%   2. Contraction alkalosis   3. Low HDL   4. Underlying COPD    Plan:  - stress thal on Friday for ischemic eval  - still with volume overload. Continue diuresis with lasix and monitor contraction alkalosis, Cr, K and Mg closely. Replete for goal K > 4 and Mg > 2  - continue with diuresis for goal BNP less than 250- will repeat in AM  - pt educated re volume restriction and low Na diet  - for CHF, cw BB, ACEI and aldactone for now.       Disposition:     Today's date:  02/11/2012  Anticipated medical stability for discharge: October,  12 - Afternoon  Service status: Inpatient: Due to: CHF  Reason for ongoing hospitalization: volume overload  Anticipated discharge needs: fu with cards    Jimmey Ralph, MD

## 2012-02-11 NOTE — Progress Notes (Signed)
[place] HEART PROGRESS NOTE  [facility]      Date Time: 02/11/2012 11:32 AM  Patient Name: Sabrina Church, Sabrina Church  Medical Record #:  [id number]  Account#:  [id number]  Admission Date:  02/08/2012         Patient Active Problem List   Diagnosis   . CHF (congestive heart failure), acute, decompensated   . COPD (chronic obstructive pulmonary disease)   . Stress-induced cardiomyopathy   . Low HDL (under 40)       Subjective:   SOB and edema improved. No chest pain    Assessment:    CHF acute systolic   CM EF 43% possibly stress induced, down from 50-55 in 2012   SVT self limited 2011   Frequent PVCs   Bifascicular block   Dyslipidemia with low HDL34, borderline LDL 106   Borderline DM a1c 6.0   COPD/asthma   remote tobacco use      Recommendations:    Time frame with onset acute HF in setting of husband's funeral suggest potential stress induced CM.   Had frequent PVCs documented previously, still frequent bigeminy and max 6b NSVT On tele.   Check Mg, repelete K and Mg to above 4 and 2 respectively   Optimal medical therapy with coreg, will gently titrate in next few days when more euvolemia - no wheezing noted   on lisinopril, will cut back dose to 2.5 as needs more diuresis and has low BPs   Agree with IV bid lasix   Add aldactone 25mg  daily   Nuclear stress Friday r/o ischemia   Barring symptomatic NSVT with LH or syncope, would defer ? Of ICD until re-evaluate EF on optimal medical therapy at 2-3 mos.   Likely discharge Sat or Sun if volume status improved, on titrated coreg (at min 6.25mg  bid) with planned holter as outpatient in 1 week and follow up at 2weeks with me        Medications:      Scheduled Meds:         budesonide Active 1 puff Inhalation BID   carvedilol Active 3.125 mg Oral Q12H SCH   enoxaparin Active 40 mg Subcutaneous QHS   furosemide Active 80 mg Intravenous BID   lisinopril Active 5 mg Oral Daily   tiotropium Active 18 mcg Inhalation QAM       Continuous Infusions:             Physical Exam:     Filed Vitals:    02/11/12 0900   BP: 106/59   Pulse: 77   Temp:    Resp: 18   SpO2: 90%     Temp (24hrs), Avg:96.9 F (36.1 C), Min:96.2 F (35.7 C), Max:97.4 F (36.3 C)      Telemetry reviewed 6b max NSVT< frequent bigeminy, SR     Intake and Output Summary (Last 24 hours) at Date Time    Intake/Output Summary (Last 24 hours) at 02/11/12 1132  Last data filed at 02/10/12 2200   Gross per 24 hour   Intake   4650 ml   Output   2600 ml   Net   2050 ml       General Appearance:  Breathing comfortable, no acute distress  Head:  normocephalic  Eyes:  EOM's intact  Neck:  No carotid bruit , JVP 12-14 +  Lungs:  Clear to auscultation throughout, no wheezes, rhonchi or rales, good respiratory effort   Heart:  Regular s1s2 with summation gallop, apical impulse  diffuse,   Abdomen:  Soft, non-tender, positive bowel sounds, no hepatojugular reflux  Extremities:  No cyanosis, clubbing, 2+ pitting edema, warm  Pulses:  Equal pulses, 4/4 symmetric  Neurologic:  Alert and oriented x3, mood and affect normal    Labs:     Lab 02/10/12 0418 02/09/12 0430 02/08/12 1806   CK 54 68 --   TROPI -- -- 0.04   TROPT -- -- --   CKMBINDEX -- -- --     No results found for this basename: DIG in the last 168 hours    Lab 02/09/12 0430   CHOL 158   TRIG 92   HDL 34*   LDL 106*       Lab 02/08/12 1806   BILITOTAL 0.8   BILIDIRECT --   PROT 6.1   ALB 3.4*   ALT 49   AST 44*     No results found for this basename: MG in the last 168 hours  No results found for this basename: PT,INR,PTT in the last 168 hours    Lab 02/10/12 0418 02/09/12 0430 02/08/12 1716   WBC 8.90 7.46 8.69   HGB 13.0 13.4 13.8   HCT 41.5 44.0 43.3   PLT 212 236 260       Lab 02/11/12 0424 02/10/12 0418 02/09/12 0430   NA 141 141 143   K 3.5 3.8 4.0   CL 98 101 104   CO2 37* 31* 29   BUN 10.0 13.0 15.0   CREAT 0.7 0.7 0.7   EGFR >60.0 >60.0 >60.0   GLU 98 108* 71   CA 8.2 8.1 8.4     No results found for this basename: TSH,FREET3,FREET4 in the last  168 hours    .  Lab Results   Component Value Date    BNP 2230* 02/08/2012        Imaging:   Radiological Procedure reviewed.        Signed by: Brett Fairy, MD      [facility]  NP Spectralink [phone] (8am-5pm)  MD Spectralink [phone] or [phone] (8am-5pm)  Arrhythmia Spectralink [phone] (8am-4:30pm)  After hours, non urgent consult line [phone]  After Hours, urgent consults [phone]

## 2012-02-11 NOTE — Plan of Care (Signed)
Telemetry SR-90's. Pt with mg level 1.7 this am- mg replacemnt ordered/complted. Lungs clear. +2 pedal and LE edema noted.  Pts weight today 85.6 kg down from 87.3 kg.  I & O's x24 hours negative 550. K 3.5.  Pt placed on 1.5 liter fluid restriction.  Pt informed and amts of fluid monitored. Pt remains on IV lasix.

## 2012-02-12 ENCOUNTER — Inpatient Hospital Stay: Payer: Medicare Other

## 2012-02-12 DIAGNOSIS — Z9289 Personal history of other medical treatment: Secondary | ICD-10-CM

## 2012-02-12 HISTORY — DX: Personal history of other medical treatment: Z92.89

## 2012-02-12 LAB — MAGNESIUM: Magnesium: 2.3 mg/dL (ref 1.6–2.6)

## 2012-02-12 LAB — BASIC METABOLIC PANEL
BUN: 13 mg/dL (ref 7.0–19.0)
CO2: 37 mEq/L — ABNORMAL HIGH (ref 22–29)
Calcium: 8.2 mg/dL (ref 7.9–10.6)
Chloride: 96 mEq/L — ABNORMAL LOW (ref 98–107)
Creatinine: 0.7 mg/dL (ref 0.6–1.0)
Glucose: 94 mg/dL (ref 70–100)
Potassium: 3.7 mEq/L (ref 3.5–5.1)
Sodium: 140 mEq/L (ref 136–145)

## 2012-02-12 LAB — GFR: EGFR: >60

## 2012-02-12 LAB — B-TYPE NATRIURETIC PEPTIDE: B-Natriuretic Peptide: 667 pg/mL — ABNORMAL HIGH (ref 0–100)

## 2012-02-12 MED ORDER — CARVEDILOL 6.25 MG PO TABS
6.25 mg | ORAL_TABLET | Freq: Two times a day (BID) | ORAL | Status: DC
Start: 2012-02-12 — End: 2012-02-14
  Administered 2012-02-12 – 2012-02-14 (×4): 6.25 mg via ORAL
  Filled 2012-02-12 (×5): qty 1

## 2012-02-12 MED ORDER — REGADENOSON 0.4 MG/5ML IV SOLN
INTRAVENOUS | Status: AC
Start: 2012-02-12 — End: ?
  Filled 2012-02-12: qty 5

## 2012-02-12 MED ORDER — FLUTICASONE PROPIONATE HFA 44 MCG/ACT IN AERO
1.00 | INHALATION_SPRAY | Freq: Two times a day (BID) | RESPIRATORY_TRACT | Status: DC
Start: 2012-02-12 — End: 2012-02-14
  Administered 2012-02-12 – 2012-02-14 (×5): 1 via RESPIRATORY_TRACT
  Filled 2012-02-12: qty 10.6

## 2012-02-12 MED ORDER — REGADENOSON 0.4 MG/5ML IV SOLN
0.40 mg | Freq: Once | INTRAVENOUS | Status: AC | PRN
Start: 2012-02-12 — End: 2012-02-12
  Administered 2012-02-12: 0.4 mg via INTRAVENOUS
  Filled 2012-02-12: qty 5

## 2012-02-12 MED ORDER — POTASSIUM CHLORIDE 20 MEQ PO PACK
40.00 meq | PACK | Freq: Once | ORAL | Status: AC
Start: 2012-02-12 — End: 2012-02-12
  Administered 2012-02-12: 40 meq via ORAL
  Filled 2012-02-12: qty 2

## 2012-02-12 MED ORDER — TECHNETIUM TC 99M TETROFOSMIN INJECTION
1.00 | Status: AC
Start: 2012-02-12 — End: 2012-02-12
  Administered 2012-02-12 (×2): 1 via INTRAVENOUS
  Filled 2012-02-12 (×2): qty 1

## 2012-02-12 MED ORDER — MAGNESIUM SULFATE IN D5W 10-5 MG/ML-% IV SOLN
1.00 g | Freq: Once | INTRAVENOUS | Status: DC
Start: 2012-02-12 — End: 2012-02-12

## 2012-02-12 NOTE — Progress Notes (Addendum)
MEDICINE PROGRESS NOTE    Date Time: 02/12/2012 7:04 AM  Patient Name: Sabrina Church  Attending Physician: Jimmey Ralph, MD      Assessment:     Active Hospital Problems    Diagnosis   . CHF (congestive heart failure), acute, decompensated       . COPD (chronic obstructive pulmonary disease)     Sabrina Church is a 71yo F with h/o well-controlled COPD, who p/w SOB, vol overload c/w stress-induced CM    Plan:   # Stress-induced CM a.k.a Takotsubo CM  - Lasix 80mg  IV BID, Lisinopril 2.5mg  PO daily, Coreg 3.125mg  PO BID (will be titrated per cards once pt is more euvolemic), Aldactone added yest  - 1.5 L fluid restriction with low Na diet  - Strict I/Os, daily weights  - Perfusion study this am to evaluate for ischemia  - Okay for d/c if BNP ~ 250 and normal O2 requirement (improved volume status)  - Will need holter monitor as outpatient in 1 week and follow up in 2 weeks with cardiology    #Contraction Alkalosis, bicarb of 37, stable  - continue diuresis but w close monitoring of bicarb  - CTM    #Low HDL  -reccomended diet modifications and exercise    # COPD: appears stable.  - Cont Spiriva, steroid inhaler    #FEN: No IVF, cardiac low sodium diet with 1.5L fluid restriction, replete K and Mg for goal of >4 and >2, respectively    # DVT prophy: lovenox    Full code    Safety Checklist:     DVT prophylaxis:  CHEST guideline (See page e199S) Chemical   Foley: Not present   IVs:  Continue   PT/OT: Ordered       Disposition:     Anticipated discharge needs: Home w assistance, Holter monitor    Subjective     CC: CHF (congestive heart failure)    Interval History/24 hour events: NAE; NPO since MN for thallium perfusion study this am    HPI/Subjective: NAE, stable SOB on NC. Reports sneezing and some congestion since yesterday, feels like she may be getting a cold.  OOB/ambulating. No CP, N, V, F.    Review of Systems:   Review of Systems - as above    Physical Exam:     Patient Vitals for the past 24 hrs:   BP Temp  Temp src Pulse Resp SpO2   02/12/12 0302 99/50 mmHg 96.2 F (35.7 C) - 76  18  91 %   02/11/12 2308 102/50 mmHg 96.8 F (36 C) - 84  18  91 %   02/11/12 2053 104/67 mmHg 98 F (36.7 C) - 82  18  97 %   02/11/12 1554 113/67 mmHg 98.1 F (36.7 C) Oral 89  18  92 %   02/11/12 1140 110/60 mmHg 96.3 F (35.7 C) Oral 92  18  94 %   02/11/12 0900 106/59 mmHg - - 77  18  90 %     Body mass index is 33.46 kg/(m^2).    Intake/Output Summary (Last 24 hours) at 02/12/12 0704  Last data filed at 02/12/12 0400   Gross per 24 hour   Intake    870 ml   Output   2200 ml   Net  -1330 ml     Wt: 84.1 kg    General: NAD AAOx3  Cardiovascular: RRR S1/S2  Lungs: BB crackles, slightly improved since yesterday  Abdomen: soft, NT,  ND  Extremities: 1+ BLE edema, stable  Other: no rashes    Meds:     Medications were reviewed.    Labs:     Recent Labs   [facility] 02/10/12 0418    WBC 8.90    HGB 13.0    HCT 41.5    PLT 212    MCV 93.0       Recent Labs   Basename 02/12/12 0305 02/11/12 0424 02/11/12 0422    NA 140 141 --    K 3.7 3.5 --    CL 96* 98 --    CO2 37* 37* --    BUN 13.0 10.0 --    CREAT 0.7 0.7 --    GLU 94 98 --    CA 8.2 8.2 --    MG -- -- 1.7    PHOS -- -- --     10/11: BNP 667    No new imaging    Signed by: Kathe Mariner, MD    Attending Attestation:   CC FU CHF    I have seen and personally examined the patient.  I agree with the findings and exam as documented by Dr. Delbert Phenix with following caveats: feels that she is urinating less than on days prior. Breathing improve but still needs 02. Reports lots of thoughts of things that she needs to do. Still with LE edema. No fevers, chills, CP at present      71 yo F   1. Stress induced cardiomyopathy:   Acute Systolic HF (decompendated) with EF 35%   2. Contraction alkalosis   3. Low HDL   4. Underlying COPD  5. Hypokalemia  6. Pre-DM      Plan:  - continue with Lasix for diuresis. Weight is trending down.  Hypoxia is improving but pt is still with 02 requirement  - cw ACEI,  diuretic, BB, spironolactone. WIll continue to titrate bb as outpatient  - based on LDL, hold on statin for now- diet modification for now. Also diet modification recommended for pre-dm.   - possible holter at discharge. Will clarify with cardiology.   - NPO for thal stress this AM.   - replete K today.     Disposition:     Today's date: 02/12/2012  Anticipated medical stability for discharge: October,  13 - Afternoon  Service status: Inpatient: Due to: CHF with ongoing volume overload, hypoxia  Reason for ongoing hospitalization: Stress test today  Anticipated discharge needs: FU with cards as outpt    Jimmey Ralph, MD

## 2012-02-12 NOTE — Progress Notes (Signed)
[place] HEART PROGRESS NOTE  [facility]      Date Time: 02/12/2012 12:14 PM  Patient Name: Sabrina Church, Sabrina Church  Medical Record #:  [id number]  Account#:  [id number]  Admission Date:  02/08/2012         Patient Active Problem List   Diagnosis   . CHF (congestive heart failure), acute, decompensated   . COPD (chronic obstructive pulmonary disease)   . Stress-induced cardiomyopathy   . Low HDL (under 40)   . Pre-diabetes (HbA1c 6.1)       Subjective:    No chest pain    Assessment:    CHF acute systolic   CM EF 20% possibly stress induced, down from 50-55 in 2012   SVT self limited 2011   Frequent PVCs, max 6beat NSVT    Bifascicular block   Dyslipidemia with low HDL34, borderline LDL 106   Borderline DM a1c 6.0   COPD/asthma   Remote tobacco use      Recommendations:    Time frame with onset acute HF in setting of husband's funeral suggest potential stress induced CM.   Optimal medical therapy with coreg, increase dose    IV bid lasix   Aldactone 25mg  daily   Nuclear stress today   Barring symptomatic NSVT with LH or syncope, would defer ? Of ICD until re-evaluate EF on optimal medical therapy at 2-3 mos.   Likely discharge Sat or Sun if volume status improved, on titrated coreg (at min 6.25mg  bid) with planned holter as outpatient in 1 week and follow up at 2 weeks        Medications:      Scheduled Meds:           carvedilol Active 3.125 mg Oral Q12H SCH   enoxaparin Active 40 mg Subcutaneous QHS   fluticasone Active 1 puff Inhalation BID   furosemide Active 80 mg Intravenous BID   lisinopril Active 2.5 mg Oral Daily   magnesium sulfate Completed      magnesium sulfate Completed 1 g Intravenous Q1H   potassium chloride Completed 40 mEq Oral Once   spironolactone Active 25 mg Oral Daily   Tc60m tetrofosmin Active 1 each Intravenous User Specified   tiotropium Active 18 mcg Inhalation QAM   DISCONTD: budesonide Discontinued 1 puff Inhalation BID   DISCONTD: magnesium sulfate Discontinued 1 g Intravenous  Once       Continuous Infusions:            Physical Exam:     Filed Vitals:    02/12/12 0814   BP: 152/69   Pulse: 84   Temp: 96 F (35.6 C)   Resp: 20   SpO2: 91%     Temp (24hrs), Avg:97 F (36.1 C), Min:96 F (35.6 C), Max:98.1 F (36.7 C)       Intake and Output Summary (Last 24 hours) at Date Time    Intake/Output Summary (Last 24 hours) at 02/12/12 1214  Last data filed at 02/12/12 0400   Gross per 24 hour   Intake    870 ml   Output   2200 ml   Net  -1330 ml       General Appearance:  Breathing comfortable, no acute distress, obese  Head:  normocephalic  Eyes:  EOM's intact  Neck:  No carotid bruit , JVP 12-14 +  Lungs:  Mild rhonchi , trace rales, good respiratory effort   Heart:  Regular s1s2 with summation gallop, apical impulse diffuse,   Abdomen:  Soft, non-tender, positive bowel sounds, no hepatojugular reflux  Extremities:  No cyanosis, clubbing, 1+ pitting edema, warm  Pulses:  Equal pulses, 4/4 symmetric  Neurologic:  Alert and oriented x3, mood and affect normal    Labs:       Lab 02/10/12 0418 02/09/12 0430 02/08/12 1806   CK 54 68 --   TROPI -- -- 0.04   TROPT -- -- --   CKMBINDEX -- -- --     No results found for this basename: DIG in the last 168 hours    Lab 02/09/12 0430   CHOL 158   TRIG 92   HDL 34*   LDL 106*       Lab 02/08/12 1806   BILITOTAL 0.8   BILIDIRECT --   PROT 6.1   ALB 3.4*   ALT 49   AST 44*       Lab 02/12/12 0305   MG 2.3     No results found for this basename: PT,INR,PTT in the last 168 hours    Lab 02/10/12 0418 02/09/12 0430 02/08/12 1716   WBC 8.90 7.46 8.69   HGB 13.0 13.4 13.8   HCT 41.5 44.0 43.3   PLT 212 236 260       Lab 02/12/12 0305 02/11/12 0424 02/10/12 0418   NA 140 141 141   K 3.7 3.5 3.8   CL 96* 98 101   CO2 37* 37* 31*   BUN 13.0 10.0 13.0   CREAT 0.7 0.7 0.7   EGFR >60.0 >60.0 >60.0   GLU 94 98 108*   CA 8.2 8.2 8.1     No results found for this basename: TSH,FREET3,FREET4 in the last 168 hours    .  Lab Results   Component Value Date    BNP 667*  02/12/2012        Imaging:     Signed by: Jari Pigg, MD [facility]      [place] [facility]  NP Spectralink [phone] (8am-5pm)  MD Spectralink [phone] or [phone] (8am-5pm)  Arrhythmia Spectralink [phone] (8am-4:30pm)  After hours, non urgent consult line [phone]  After Hours, urgent consults [phone]

## 2012-02-12 NOTE — Plan of Care (Signed)
Pt stable. Denies pain, sob, dizziness. Magesium of 1.7 replaced, 2gm. Given. Fluid Restriction observed.  SR on tele. 97% on 2LNC. NPo p midnight for Myocardial Perfusion. Study. Will continue to monitor.

## 2012-02-13 LAB — BASIC METABOLIC PANEL
BUN: 18 mg/dL (ref 7.0–19.0)
CO2: 38 mEq/L — ABNORMAL HIGH (ref 22–29)
Calcium: 8.4 mg/dL (ref 7.9–10.6)
Chloride: 97 mEq/L — ABNORMAL LOW (ref 98–107)
Creatinine: 0.8 mg/dL (ref 0.6–1.0)
Glucose: 105 mg/dL — ABNORMAL HIGH (ref 70–100)
Potassium: 3.9 mEq/L (ref 3.5–5.1)
Sodium: 142 mEq/L (ref 136–145)

## 2012-02-13 LAB — MAGNESIUM: Magnesium: 2 mg/dL (ref 1.6–2.6)

## 2012-02-13 LAB — B-TYPE NATRIURETIC PEPTIDE: B-Natriuretic Peptide: 1256 pg/mL — ABNORMAL HIGH (ref 0–100)

## 2012-02-13 LAB — GFR: EGFR: >60

## 2012-02-13 NOTE — Progress Notes (Signed)
[place] HEART PROGRESS NOTE  [facility]      Date Time: 02/13/2012 10:41 AM  Patient Name: Sabrina Church, Sabrina Church  Medical Record #:  [id number]  Account#:  [id number]  Admission Date:  02/08/2012         Patient Active Problem List   Diagnosis   . CHF (congestive heart failure), acute, decompensated   . COPD (chronic obstructive pulmonary disease)   . Stress-induced cardiomyopathy   . Low HDL (under 40)   . Pre-diabetes (HbA1c 6.1)       Subjective:   Denies chest pain, SOB or palpitations.    Assessment:   CHF acute systolic   CM EF 72% possibly stress induced, down from 50-55 in 2012   SVT self limited 2011   Frequent PVCs, max 6beat NSVT   Bifascicular block   Dyslipidemia with low HDL34, borderline LDL 106   Borderline DM a1c 6.0   COPD/asthma   Remote tobacco use      Recommendations:    Cont current medical management   Change diuresis from IV to PO    Lexiscan Thallium--small anterior reversible defect may represent change in breast position, mild-mod LV wall hypokinesis, LVEF 37%   6 kg weight loss since admission      Medications:      Scheduled Meds:         carvedilol Active 6.25 mg Oral Q12H SCH   enoxaparin Active 40 mg Subcutaneous QHS   fluticasone Active 1 puff Inhalation BID   furosemide Active 80 mg Intravenous BID   lisinopril Active 2.5 mg Oral Daily   spironolactone Active 25 mg Oral Daily   Tc62m tetrofosmin Completed 1 each Intravenous User Specified   tiotropium Active 18 mcg Inhalation QAM   DISCONTD: carvedilol Discontinued 3.125 mg Oral Q12H SCH       Continuous Infusions:            Physical Exam:     Filed Vitals:    02/13/12 0922   BP: 91/49 range 99-152/48-69   Pulse: 98   Temp:    Resp: 18   SpO2: 92 3L     Temp (24hrs), Avg:97.1 F (36.2 C), Min:96.5 F (35.8 C), Max:97.8 F (36.6 C)      Telemetry reviewed no changes. NSR     Intake and Output Summary (Last 24 hours) at Date Time    Intake/Output Summary (Last 24 hours) at 02/13/12 1041  Last data filed at 02/13/12 0500   Gross  per 24 hour   Intake    250 ml   Output   1950 ml   Net  -1700 ml       General Appearance:  Breathing comfortable, no acute distress  Neck:  Nojugular venous distension  Lungs:  Clear to auscultation throughout, with fine rales in bases  Heart:  S1, S2 normal, no S3, no S4, no murmur  Abdomen:  Soft, non-tender, positive bowel sounds  Extremities:  No cyanosis, 1+ LE edema  Pulses:  Equal pulses, 4/4 symmetric  Neurologic:  Alert and oriented x3, mood and affect normal    Labs:     Lab 02/10/12 0418 02/09/12 0430 02/08/12 1806   CK 54 68 --   TROPI -- -- 0.04   TROPT -- -- --   CKMBINDEX -- -- --     No results found for this basename: DIG in the last 168 hours    Lab 02/09/12 0430   CHOL 158   TRIG 92  HDL 34*   LDL 106*       Lab 02/08/12 1806   BILITOTAL 0.8   BILIDIRECT --   PROT 6.1   ALB 3.4*   ALT 49   AST 44*       Lab 02/13/12 0126   MG 2.0     No results found for this basename: PT,INR,PTT in the last 168 hours    Lab 02/10/12 0418 02/09/12 0430 02/08/12 1716   WBC 8.90 7.46 8.69   HGB 13.0 13.4 13.8   HCT 41.5 44.0 43.3   PLT 212 236 260       Lab 02/13/12 0126 02/12/12 0305 02/11/12 0424   NA 142 140 141   K 3.9 3.7 3.5   CL 97* 96* 98   CO2 38* 37* 37*   BUN 18.0 13.0 10.0   CREAT 0.8 0.7 0.7   EGFR >60.0 >60.0 >60.0   GLU 105* 94 98   CA 8.4 8.2 8.2       .  Lab Results   Component Value Date    BNP 1256* 02/13/2012        Imaging:   Radiological Procedure reviewed.        Signed by: Teresita Madura, NP      [facility]  NP Spectralink [phone] (8am-5pm)  MD Spectralink [phone] or [phone] (8am-5pm)  Arrhythmia Spectralink [phone] (8am-4:30pm)  After hours, non urgent consult line [phone]  After Hours, urgent consults [phone]

## 2012-02-13 NOTE — Plan of Care (Addendum)
Pts stable. S/P Myocardial Perfusion Study, new EF of 35% from 65% in Feb. BNP UP 1256. Fluid Restriction observed and pt is on Lasix IV 80 mg BID w/ good urine output.+2 loex edema. Plan. Continue Iv Antibiotics . POssible d/c home w/ Holter monitor, cardio consult f/u  As outpt in 1-2 weeks., per MD.

## 2012-02-13 NOTE — Plan of Care (Signed)
Continue to diurese.  Patient using hat and assisting with I&O.  She understands 1.5 liter fluid restriction.  S. Bushra Denman, RN>

## 2012-02-13 NOTE — Progress Notes (Addendum)
MEDICINE PROGRESS NOTE    Date Time: 02/13/2012 9:00 AM  Patient Name: Sabrina Church  Attending Physician: Jimmey Ralph, MD      Assessment:     Active Hospital Problems    Diagnosis   . Pre-diabetes (HbA1c 6.1)   . Stress-induced cardiomyopathy   . Low HDL (under 40)   . CHF (congestive heart failure), acute, decompensated   . COPD (chronic obstructive pulmonary disease)     Sabrina Church is a 71yo F with h/o well-controlled COPD, who p/w SOB, vol overload c/w stress-induced CM    Plan:   # Stress-induced CM a.k.a Takotsubo CM. NM scan with small sized anterior reversible defect which may represent change in  breast position (i.e., breast attenuation artifact) versus small area of ischemia. BNP at 1141 this am.  - Lisinopril 2.5mg  PO daily, Coreg 6.25mg  PO BID,  Aldactone  - 1.5 L fluid restriction with low Na diet   - Switch Lasix 80mg  IV BID to PO- will wait for cards recs  - Await cards recs, okay to d/c today from our perspective  - Strict I/Os, daily weights   - Will need holter monitor as outpatient in 1 week and follow up in 2 weeks with cardiology     #Contraction Alkalosis, bicarb of 37, stable   - continue diuresis but w close monitoring of bicarb     #Low HDL   -recomended diet modifications and exercise     # COPD: appears stable.   - Cont Spiriva, steroid inhaler     #FEN: No IVF, cardiac low sodium diet with 1.5L fluid restriction, replete K and Mg for goal of >4 and >2, respectively     # DVT prophy: lovenox   Full code    Dispo: home today    Safety Checklist:     DVT prophylaxis:  CHEST guideline (See page [id number]) Chemical   Foley: Not present   IVs:  Peripheral IV   PT/OT: Not needed       Disposition:     Anticipated discharge needs: None      Subjective     CC: CHF (congestive heart failure)    Interval History/24 hour events: 10b and 6b run of VT, unsustained; resolved on their own.      HPI/Subjective: No complaints, wants to go home. Still with v min LE edema but no shortness of breath,  cough, chest pain. OOB/ambulating.  Feels like 'self'    Review of Systems:   Review of Systems - Negative except HPI    Physical Exam:   Patient Vitals for the past 24 hrs:   BP Temp Temp src Pulse Resp SpO2 Weight   02/13/12 0831 118/60 mmHg 97.1 F (36.2 C) - 83  16  91 % -   02/13/12 0700 - - - - - - 83.462 kg (184 lb)   02/13/12 0516 99/48 mmHg 96.5 F (35.8 C) - 75  18  93 % -   02/12/12 2329 100/64 mmHg 97 F (36.1 C) - 89  18  92 % -   02/12/12 2002 120/59 mmHg 97.2 F (36.2 C) - 96  18  96 % -   02/12/12 1616 123/58 mmHg 97.8 F (36.6 C) Oral 89  18  96 % -     Body mass index is 32.59 kg/(m^2).    Intake/Output Summary (Last 24 hours) at 02/13/12 0900  Last data filed at 02/13/12 0500   Gross per 24 hour   Intake  250 ml   Output   1950 ml   Net  -1700 ml       General: NAD AAOx3   Cardiovascular: RR, S1/S2   Lungs: CTAB Good air movement. No wheezing.  Abdomen: soft, NT, ND   Extremities: min trace BLE edema, stable   Other: no rashes    Meds:     Medications were reviewed.    Labs:     No results found for this basename: WBC:2,HGB:2,HCT:2,PLT:2,MCV:2 in the last 72 hours    Recent Labs   [facility] 02/13/12 0126 02/12/12 0305    NA 142 140    K 3.9 3.7    CL 97* 96*    CO2 38* 37*    BUN 18.0 13.0    CREAT 0.8 0.7    GLU 105* 94    CA 8.4 8.2    MG 2.0 2.3    PHOS -- --     BNP 1141    No results found for this basename: AST:2,ALT:2,ALKPHOS:2,PROT:2,ALB:2 in the last 72 hours    No results found for this basename: PTT:2,PT:2,INR:2 in the last 72 hours    Imaging personally reviewed, including:     NM stress:  The overall quality of the study is good. Attenuation  artifact is not noted. The left ventricle is normal in size.   Pharmacologic stress SPECT images reveal a small sized anterior defect  of very mild intensity. Otherwise, homogeneous tracer throughout the  myocardium. The rest images reveal normal perfusion. The gated wall  motion study reveals mild - moderate generalized LV wall  hypokinesis.  The calculated left ventricular ejection fraction is 37%.    CONCLUSION:    1. Small sized anterior reversible defect which may represent change in  breast position (i.e., breast attenuation artifact) versus small area of  ischemia.  2. Mild - moderate generalized LV wall hypokinesis with mild to moderate  LV systolic dysfunction. Ejection fraction of 37%.  3. No previous study is available for comparison.    Case discussed with: Dr Randel Books

## 2012-02-14 LAB — BASIC METABOLIC PANEL
BUN: 16 mg/dL (ref 7.0–19.0)
CO2: 38 mEq/L — ABNORMAL HIGH (ref 22–29)
Calcium: 8.6 mg/dL (ref 7.9–10.6)
Chloride: 96 mEq/L — ABNORMAL LOW (ref 98–107)
Creatinine: 0.7 mg/dL (ref 0.6–1.0)
Glucose: 104 mg/dL — ABNORMAL HIGH (ref 70–100)
Potassium: 3.9 mEq/L (ref 3.5–5.1)
Sodium: 139 mEq/L (ref 136–145)

## 2012-02-14 LAB — MAGNESIUM: Magnesium: 2 mg/dL (ref 1.6–2.6)

## 2012-02-14 LAB — B-TYPE NATRIURETIC PEPTIDE: B-Natriuretic Peptide: 1141 pg/mL — ABNORMAL HIGH (ref 0–100)

## 2012-02-14 LAB — GFR: EGFR: >60

## 2012-02-14 MED ORDER — POTASSIUM CHLORIDE CRYS ER 20 MEQ PO TBCR
20.00 meq | EXTENDED_RELEASE_TABLET | Freq: Once | ORAL | Status: AC
Start: 2012-02-14 — End: 2012-02-14
  Administered 2012-02-14: 20 meq via ORAL
  Filled 2012-02-14: qty 1

## 2012-02-14 MED ORDER — FUROSEMIDE 20 MG PO TABS
80.00 mg | ORAL_TABLET | Freq: Two times a day (BID) | ORAL | Status: AC
Start: 2012-02-14 — End: 2013-02-13

## 2012-02-14 MED ORDER — ASPIRIN 81 MG PO TBEC
81.00 mg | DELAYED_RELEASE_TABLET | Freq: Every day | ORAL | Status: DC
Start: 2012-02-14 — End: 2012-02-14
  Administered 2012-02-14: 81 mg via ORAL
  Filled 2012-02-14: qty 1

## 2012-02-14 MED ORDER — CARVEDILOL 6.25 MG PO TABS
6.25 mg | ORAL_TABLET | Freq: Two times a day (BID) | ORAL | Status: AC
Start: 2012-02-14 — End: 2013-02-13

## 2012-02-14 MED ORDER — LISINOPRIL 2.5 MG PO TABS
2.50 mg | ORAL_TABLET | Freq: Every day | ORAL | Status: AC
Start: 2012-02-14 — End: 2013-02-13

## 2012-02-14 MED ORDER — ASPIRIN EC 81 MG PO TBEC
81.00 mg | DELAYED_RELEASE_TABLET | Freq: Every day | ORAL | Status: AC
Start: 2012-02-14 — End: 2013-02-13

## 2012-02-14 MED ORDER — MAGNESIUM SULFATE IN D5W 10-5 MG/ML-% IV SOLN
1.00 g | Freq: Once | INTRAVENOUS | Status: AC
Start: 2012-02-14 — End: 2012-02-14
  Administered 2012-02-14: 1 g via INTRAVENOUS
  Filled 2012-02-14: qty 100

## 2012-02-14 NOTE — Plan of Care (Signed)
Patient denies chest pain, shortness of breath of feeling dizzy. Ambulating on the hallway with assistance. Experienced 6 beats of V-tach and 5 beats of SVTs. Multiple PVCs noted. Patient is asymptomatic. Will continue to monitor patient per plan of care.

## 2012-02-14 NOTE — Plan of Care (Signed)
Patient experienced 6 beats and 10 beats of V-tach. MD notified.

## 2012-02-14 NOTE — Discharge Summary (Signed)
DISCHARGE SUMMARY      Date Time: 02/14/2012 3:59 PM  Patient Name: Sabrina Church  Attending Physician: Jimmey Ralph, MD  Primary Care Physician: Eddie Dibbles, MD    Date of Admission:   02/08/2012    Date of Discharge:   02/14/12    Discharge Dx:   Principal Problem (Resolved):   *CHF (congestive heart failure), acute, decompensated  Active Problems:   Stress-induced cardiomyopathy   Low HDL (under 40)   Pre-diabetes (HbA1c 6.1)   Contraction alkalosis      Consultations:   Treatment Team: Attending Provider: Jimmey Ralph, MD; Consulting Physician: Olena Leatherwood, MD; Resident: Jerre Simon, MD; Resident: Verlon Au, MD; Resident: Kathe Mariner, MD; Consulting Physician: Jimmey Ralph, MD; Technician: Maudry Mayhew; Case Manager: Genelle Bal, RN; Respiratory Care Practitioner: Theotis Burrow, RCP; Registered Nurse: Adline Peals, RN     Labs/Procedures/Radiology performed:     Stress Test  Indication: Evaluation of chest pain   Risk Factors: Positive family history cad   LEXISCAN MYOVIEW PERFUSION STUDY:   Procedure: The patient was injected intravenously with 10.6 mCi of   Myoview and SPECT imaging was begun 30 minutes post injection. The   camera obtained images with a 180-degree orbit around the patients   heart. After the rest images, the patient was then prepped for a   Lexiscan pharmacologic stress test. The patient was given 5 ml (0.4mg )   of IV Lexiscan (Regadenoson) over a ten second rapid infusion with a   five to ten ml normal saline flush given immediately after the infusion.   The patient was then given an intravenous injection of 33.0 mCi of   Myoview followed by another five to ten ml saline flush. The patient was   monitored for another eight to ten minutes or until the patients EKG,   BP, and symptoms came back to baseline. Gated SPECT imaging was started   45 minutes post injection. Gated SPECT imaging was started at least 30   minutes post  injection. The patient was then placed in the supine   position and the images were obtained with a 180-degree orbit around the   patients heart while gating to the patients ECG. The study was then   processed and displayed for evaluation.   STRESS:   Resting ECG:   Resting Heart Rate: 92Peak Heart Rate: 96   Resting Blood Pressure: 96/66Peak Blood Pressure: 115/59   Symptoms: Nonlimiting chest pain   Stress ECG: Electrically negative   IMPRESSION:   Impression: Electrically negative   The above interpretation is as per the stressing physician. See separate   stress sheet for details.   INTERPRETATION: The overall quality of the study is good. Attenuation   artifact is not noted. The left ventricle is normal in size.   Pharmacologic stress SPECT images reveal a small sized anterior defect   of very mild intensity. Otherwise, homogeneous tracer throughout the   myocardium. The rest images reveal normal perfusion. The gated wall   motion study reveals mild - moderate generalized LV wall hypokinesis.   The calculated left ventricular ejection fraction is 37%.   CONCLUSION:   1. Small sized anterior reversible defect which may represent change in   breast position (i.e., breast attenuation artifact) versus small area of   ischemia.   2. Mild - moderate generalized LV wall hypokinesis with mild to moderate   LV systolic dysfunction. Ejection fraction of 37%.   3. No previous study is  available for comparison.    Results     Procedure Component Value Units Date/Time    B-type Natriuretic Peptide [[id number]]  (Abnormal) Collected:02/14/12 0609    Specimen Information:Blood Updated:02/14/12 0701     B-Natriuretic Peptide 1141 (H) pg/mL     Basic metabolic panel [[id number]]  (Abnormal) Collected:02/14/12 0609    Specimen Information:Blood Updated:02/14/12 0659     Glucose 104 (H) mg/dL      BUN 01.0 mg/dL      Creatinine 0.7 mg/dL      Calcium 8.6 mg/dL      Sodium 272 mEq/L      Potassium 3.9 mEq/L      Chloride 96 (L)  mEq/L      CO2 38 (H) mEq/L     Magnesium [[id number]] Collected:02/14/12 0609    Specimen Information:Blood Updated:02/14/12 0659     Magnesium 2.0 mg/dL     GFR [[id number]] VFIEPPIRJ:18/84/16 0609     EGFR >60.0   Updated:02/14/12 0659    B-type Natriuretic Peptide [[id number]]  (Abnormal) Collected:02/13/12 0126    Specimen Information:Blood Updated:02/13/12 0215     B-Natriuretic Peptide 1256 (H) pg/mL     Basic metabolic panel [[id number]]  (Abnormal) Collected:02/13/12 0126    Specimen Information:Blood Updated:02/13/12 0206     Glucose 105 (H) mg/dL      BUN 22.0 mg/dL      Creatinine 0.8 mg/dL      Calcium 8.4 mg/dL      Sodium 254 mEq/L      Potassium 3.9 mEq/L      Chloride 97 (L) mEq/L      CO2 38 (H) mEq/L     Magnesium [[id number]] Collected:02/13/12 0126    Specimen Information:Blood Updated:02/13/12 0206     Magnesium 2.0 mg/dL     GFR [[id number]] WVPXTGGYI:94/85/46 0126     EGFR >60.0   Updated:02/13/12 0206    Magnesium [[id number]] Collected:02/12/12 0305    Specimen Information:Blood Updated:02/12/12 8182     Magnesium 2.3 mg/dL     Basic metabolic panel [[id number]]  (Abnormal) Collected:02/12/12 0305    Specimen Information:Blood Updated:02/12/12 0423     Glucose 94 mg/dL      BUN 89.3 mg/dL      Creatinine 0.7 mg/dL      Calcium 8.2 mg/dL      Sodium 810 mEq/L      Potassium 3.7 mEq/L      Chloride 96 (L) mEq/L      CO2 37 (H) mEq/L     GFR [[id number]] Collected:02/12/12 0305     EGFR >60.0   Updated:02/12/12 0423    B-type Natriuretic Peptide [[id number]]  (Abnormal) Collected:02/12/12 0305    Specimen Information:Blood Updated:02/12/12 0422     B-Natriuretic Peptide 667 (H) pg/mL     Magnesium [[id number]] Collected:02/11/12 0422    Specimen Information:Blood Updated:02/11/12 1223     Magnesium 1.7 mg/dL     Basic metabolic panel [[id number]]  (Abnormal) Collected:02/11/12 0424    Specimen Information:Blood Updated:02/11/12 0519     Glucose 98 mg/dL      BUN 09.3 mg/dL      Creatinine 0.7 mg/dL       Calcium 8.2 mg/dL      Sodium 267 mEq/L      Potassium 3.5 mEq/L      Chloride 98 mEq/L      CO2 37 (H) mEq/L     GFR [[id number]] Collected:02/11/12 0424     EGFR >60.0   Updated:02/11/12 0519  NM Myocardial Perfusion Spect (Stress And Rest) [[id number]] Collected:02/12/12 1335 Order Status:Completed Updated:02/12/12 1506 Narrative: Indication: Evaluation of chest pain    Risk Factors: Positive family history cad      LEXISCAN MYOVIEW PERFUSION STUDY:    Procedure: The patient was injected intravenously with 10.6 mCi of  Myoview and SPECT imaging was begun 30 minutes post injection. The  camera obtained images with a 180-degree orbit around the patients  heart. After the rest images, the patient was then prepped for a  Lexiscan pharmacologic stress test. The patient was given 5 ml (0.4mg )  of IV Lexiscan (Regadenoson) over a ten second rapid infusion with a  five to ten ml normal saline flush given immediately after the infusion.  The patient was then given an intravenous injection of 33.0 mCi of  Myoview followed by another five to ten ml saline flush. The patient was  monitored for another eight to ten minutes or until the patients EKG,  BP, and symptoms came back to baseline. Gated SPECT imaging was started  45 minutes post injection. Gated SPECT imaging was started at least 30  minutes post injection. The patient was then placed in the supine  position and the images were obtained with a 180-degree orbit around the  patients heart while gating to the patients ECG. The study was then  processed and displayed for evaluation.      STRESS:    Resting ECG:    Resting Heart Rate: 92Peak Heart Rate: 96    Resting Blood Pressure: 96/66Peak Blood Pressure: 115/59    Symptoms: Nonlimiting chest pain    Stress ECG: Electrically negative  Impression: Impression: Electrically negative    The above interpretation is as per the stressing physician. See separate  stress sheet for details.      INTERPRETATION: The overall  quality of the study is good. Attenuation  artifact is not noted. The left ventricle is normal in size.   Pharmacologic stress SPECT images reveal a small sized anterior defect  of very mild intensity. Otherwise, homogeneous tracer throughout the  myocardium. The rest images reveal normal perfusion. The gated wall  motion study reveals mild - moderate generalized LV wall hypokinesis.  The calculated left ventricular ejection fraction is 37%.    CONCLUSION:    1. Small sized anterior reversible defect which may represent change in  breast position (i.e., breast attenuation artifact) versus small area of  ischemia.  2. Mild - moderate generalized LV wall hypokinesis with mild to moderate  LV systolic dysfunction. Ejection fraction of 37%.  3. No previous study is available for comparison.      Hospital Course:   Sabrina Church is a 71 y.o. female who is a former smoker with hx of well controlled COPD, NSVT post-op in 2011 who was transferred to [place] from [place] for further management. Essentially she had been in good health PTA, though not following up with doctors regularly due to stress and increased time constraints dealing with her husband's illness until 2 weeks ago at which point her husband passed away. She noted development of BLE edema and progressive dyspnea on exertion.  Denied chest pain or palpitations. She denied any change in diet or new medications. She went to PCP on the DOA and was sent to [facility] where a CXR was done and was consistent with CHF. She was give lasix 20 IV, O2 and aspirin.  She was subsequently transferred to [facility]. She diuresed well and underwent a stress test that was  unrevealing. She was discharged to fu with cardiology.     DISCHARGE DAY EXAM:  Patient Vitals for the past 24 hrs:   BP Temp Temp src Pulse Resp SpO2 Weight   02/14/12 1620 133/65 mmHg 96 F (35.6 C) Oral 58  18  95 % -   02/14/12 1121 136/64 mmHg 97 F (36.1 C) Oral 86  18  90 % -   02/14/12 0900 104/50  mmHg - - - 20  90 % -   02/14/12 0700 - - - - - - 82.373 kg (181 lb 9.6 oz)   02/13/12 2323 92/50 mmHg 96.8 F (36 C) Oral 79  15  92 % -   02/13/12 2048 102/58 mmHg 98.1 F (36.7 C) Oral 69  18  95 % -     Body mass index is 32.17 kg/(m^2).  General: awake, alert, oriented x 3; no acute distress.  Cardiovascular: regular rate and rhythm, no murmurs, rubs or gallops  Lungs:scant bibasilar crackles  Abdomen: soft, non-tender, non-distended; no palpable masses, no hepatosplenomegaly, normoactive bowel sounds, no rebound or guarding  Extremities: no clubbing, cyanosis, 1+ edema      Discharge Medications:     Medication List  As of 02/14/2012  3:59 PM    START taking these medications           carvedilol 6.25 MG tablet    Commonly known as: COREG    Take 1 tablet (6.25 mg total) by mouth every 12 (twelve) hours.        furosemide 20 MG tablet    Commonly known as: LASIX    Take 4 tablets (80 mg total) by mouth 2 (two) times daily.        lisinopril 2.5 MG tablet    Commonly known as: PRINIVIL,ZESTRIL    Take 1 tablet (2.5 mg total) by mouth daily.             CONTINUE taking these medications           fluticasone 50 MCG/BLIST diskus inhaler    Commonly known as: FLOVENT DISKUS        levalbuterol 45 MCG/ACT inhaler    Commonly known as: XOPENEX HFA        tiotropium 18 MCG inhalation capsule    Commonly known as: SPIRIVA               Where to get your medications     These are the prescriptions that you need to pick up.    You may get these medications from any pharmacy.           carvedilol 6.25 MG tablet    furosemide 20 MG tablet    lisinopril 2.5 MG tablet                   Discharge Instructions:   The patient was given discharge information regarding heart failure and was told to return if any of her symptoms worsened.      Disposition:  home    Patient was instructed to follow up with Primary Care Doctor Eddie Dibbles, MD in 2 weeks and with cardiology in 1 week    Minutes spent coordinating  discharge and reviewing discharge plan:30 minutes      Signed by: Kathe Mariner, MD    CC: Eddie Dibbles, MD    Attending Attestation:   CC FU CHF  I have seen and personally examined the patient this AM.  I  agree with the findings and exam as documented by Dr. Delbert Phenix with following caveats: Feeling better. Seen walking laps. Decreased swelling. No fevers, chills, chest pain. SOB improved    71 yo F   1. Stress induced cardiomyopathy:   Acute Systolic HF (decompendated) with EF 35%   2. Contraction alkalosis   3. Low HDL   4. Underlying COPD    Plan:  - change to 80 lasix PO BID for ongoing diuresis.   - if ok with cardiology, proceed with [place] today and close outpt fu as will require ongoing diuresis with PO lasix  - educated as to daily weight, low Na diet, volume restriction, low sugar and low cholesterol diet. All questions answered.     Jimmey Ralph, MD

## 2012-02-14 NOTE — Discharge Instructions (Signed)
Acute Myocardial Infarction and/or Heart Failure Core Measure Checklist at Discharge:    Activity:  Light Independent & Advance as Tolerated    Diet:  Cardiac Diet    Medications:  Aspirin:    Not Prescribed - Not Applicable  Statin:    Not Prescribed - Not Applicable- only mild dyslipidemia  Nitroglycerin:      Not Prescribed -  Not Applicable  ACE-I:    Prescribed  ARB:     Not Prescribed - on ACEI  B-Blocker:    Prescribed  Platelet Inhibitor:   Not Prescribed - Not Applicable    If you've been prescribed a Platelet Inhibitor (clopidogrel, prasugrel, ticlopidine, ticagrelor) do not discontinue without consulting your cardiologist.    Follow up appointment:  If there are no follow-up appointments listed above, please call your physician in the next 1-2 days to schedule a follow-up office visit.  If you do not have a primary care physician, please visit our website, [email or web address], and the Find a Doctor section on the main page.    Cardiac Rehab:  You have been referred to Cardiac Rehab    For the promotion of your health, you are advised not to smoke and to monitor your weight daily.      Should your symptoms worsen or recur, contact your physician.    In the event of an emergency, severe shortness of breath, unrelieved chest pain or chest pressure / tightness, dial 911.

## 2012-02-14 NOTE — Progress Notes (Addendum)
[facility] PROGRESS NOTE  [facility]      Date Time: 02/14/2012 10:06 AM  Patient Name: Sabrina Church, Sabrina Church  Medical Record #:  [id number]  Account#:  [id number]  Admission Date:  02/08/2012         Patient Active Problem List   Diagnosis   . CHF (congestive heart failure), acute, decompensated   . COPD (chronic obstructive pulmonary disease)   . Stress-induced cardiomyopathy   . Low HDL (under 40)   . Pre-diabetes (HbA1c 6.1)       Subjective:   Denies chest pain, SOB, +palpitations, Off O2 wants to go home today if possible. Ambulated well without c/o    Assessment:   CHF acute systolic   Echo 02/09/12: EF 35%, Gr 1 DD, mild MR, mild-mod TR, mild RV dysfcn  CM, possibly stress induced  SVT self limited 2011   Frequent PVCs, max 6beat NSVT   Bifascicular block   Dyslipidemia with low HDL34, borderline LDL 106   Borderline DM a1c 6.0   COPD/asthma   Remote tobacco use  Lexiscan MPI 02/12/12--small anterior reversible defect may represent change in breast position versus minimal ischemia, mild-mod LV wall hypokinesis, LVEF 37%        Recommendations:    Reviewed monitor, c/o palpitations noted frequent PVCs, and NSVT longest 10 bts   Replete K, level today 3.9 and will give additional Mg    Continue coreg @6 .25 mg BID. Continue ACE I and aldactone.    Add in ASA 81 mg qD        Medications:      Scheduled Meds:         carvedilol Active 6.25 mg Oral Q12H SCH   enoxaparin Active 40 mg Subcutaneous QHS   fluticasone Active 1 puff Inhalation BID   furosemide Active 80 mg Intravenous BID   lisinopril Active 2.5 mg Oral Daily   spironolactone Active 25 mg Oral Daily   tiotropium Active 18 mcg Inhalation QAM       Continuous Infusions:            Physical Exam:     Filed Vitals:    02/14/12 0900   BP: 104/50   Pulse: 79   Temp:    Resp: 20   SpO2: 90%     Temp (24hrs), Avg:96.9 F (36.1 C), Min:96.3 F (35.7 C), Max:98.1 F (36.7 C)      Telemetry reviewed no changes. NSR with freq PVCs, 3 runs of NSVT 4-5 bts  and 10 bts      Intake and Output Summary (Last 24 hours) at Date Time    Intake/Output Summary (Last 24 hours) at 02/14/12 1006  Last data filed at 02/14/12 0600   Gross per 24 hour   Intake    580 ml   Output   2200 ml   Net  -1620 ml       General Appearance:  Breathing comfortable, no acute distress  Neck:  mild jugular venous distension  Lungs:  Clear to auscultation throughout, fine rales left base  Heart:  S1, S2 normal, no S3, + S4, no murmur  Abdomen:  Soft, non-tender, positive bowel sounds  Extremities:  No cyanosis, + edema-feet, per pt in am no edema noted, began after sitting in chair with feet dependent  Pulses:  Equal pulses  Neurologic:  Alert and oriented x3, mood and affect normal    Labs:     Lab 02/10/12 0418 02/09/12 0430 02/08/12 1806  CK 54 68 --   TROPI -- -- 0.04   TROPT -- -- --   CKMBINDEX -- -- --       Lab 02/09/12 0430   CHOL 158   TRIG 92   HDL 34*   LDL 106*       Lab 02/08/12 1806   BILITOTAL 0.8   BILIDIRECT --   PROT 6.1   ALB 3.4*   ALT 49   AST 44*       Lab 02/14/12 0609   MG 2.0       Lab 02/10/12 0418 02/09/12 0430 02/08/12 1716   WBC 8.90 7.46 8.69   HGB 13.0 13.4 13.8   HCT 41.5 44.0 43.3   PLT 212 236 260       Lab 02/14/12 0609 02/13/12 0126 02/12/12 0305   NA 139 142 140   K 3.9 3.9 3.7   CL 96* 97* 96*   CO2 38* 38* 37*   BUN 16.0 18.0 13.0   CREAT 0.7 0.8 0.7   EGFR >60.0 >60.0 >60.0   GLU 104* 105* 94   CA 8.6 8.4 8.2         .  Lab Results   Component Value Date    BNP 1141* 02/14/2012        Imaging:   Radiological Procedure reviewed.        Signed by: Teresita Madura, NP    Patient seen and examined, assessment and plan reviewed. Continue meds, add in ASA. [place] from CV standpoint after lytes repleted and will arrange f/u in office    Jari Pigg, MD [facility]    [facility]  NP Spectralink [phone] (8am-5pm)  MD Spectralink [phone] or [phone] (8am-5pm)  Arrhythmia Spectralink [phone] (8am-4:30pm)  After hours, non urgent consult line [phone]  After Hours,  urgent consults [phone]

## 2012-02-15 NOTE — Progress Notes (Signed)
Service Date: 02/14/2012     Patient Type: I     PHYSICIAN/PROVIDER: Jari Pigg MD     Please refer to the initial consultation that was completed at the time of  admission.     Briefly, Ms. Velador is a 71 year old female who was admitted to [facility] with progressive shortness of breath.  Evaluation here  demonstrated congestive heart failure, acute systolic with an LVEF by  echocardiogram of 30.  She underwent a Lexiscan thallium which showed a  small anterior reversible defect representing breast attenuation.  She had  mild to moderate LV wall hypokinesis with an LVEF again of 37%.  She has  remained stable from a cardiovascular standpoint.  She does have a history  of SVT self-limiting in 2011, frequent PVCs with a bifascicular block and  she also has a history of diabetes mellitus with a hemoglobin A1C of 6.0,  COPD and asthma (currently on inhalers) and dyslipidemia.     During the hospital course she was aggressively diuresed with IV Lasix  b.i.d.  She has had substantial weight loss and improvement in her  symptoms.  She is now deemed stable and ready for hospital discharge.  She  is now being discharged home with instructions to follow up in the office  in approximately 1 week.  Prior to hospital discharge she was again noted  to have frequent PVCs and a run of nonsustained VT, the longest of 10  beats.  She had her Coreg titrated.  However, this has been limited due to  hypotension.  This can be of further addressed as an outpatient.     She is being discharged home in stable condition.  She is going to follow  up in the office within 1 week.     Her medications currently at the time of this dictation include aspirin 81  mg daily, Coreg 6.25 mg b.i.d., Lovenox for DVT prophylaxis (being  discontinued at discharge), Flovent 1 puff b.i.d., Lasix 80 mg IV b.i.d.  changing to p.o. at discharge, lisinopril 2.5 mg daily and Aldactone 25 mg  daily as well as Tiotropium 18 mcg q.a.m.      DISPOSITION:  Again, Ms. Incorvaia is being discharged home in stable condition.  She is to  follow up in the office in 1 week.  Our schedulers will be calling to make  those arrangements.  She will have a repeat echocardiogram after optimized  medical therapy in approximately 3 months.     All of the above has been explained to the patient and the daughter who is  at the bedside.           D:  02/14/2012 16:05 PM by Ms. Pierce Crane. Maple Hudson, NP [phone])  T:  02/15/2012 07:02 AM by Justice Deeds      Everlean Cherry: [id number]) (Doc ID: [id number])

## 2012-04-08 ENCOUNTER — Ambulatory Visit: Payer: Medicare Other | Admitting: Interventional Cardiology

## 2012-04-08 ENCOUNTER — Encounter
Admission: RE | Disposition: A | Discharge status: Home / Self Care | Payer: Self-pay | Source: Ambulatory Visit | Attending: Interventional Cardiology

## 2012-04-08 ENCOUNTER — Ambulatory Visit
Admission: RE | Admit: 2012-04-08 | Discharge: 2012-04-08 | Disposition: A | Discharge status: Home / Self Care | Payer: Medicare Other | Source: Ambulatory Visit | Attending: Interventional Cardiology | Admitting: Interventional Cardiology

## 2012-04-08 DIAGNOSIS — Z7982 Long term (current) use of aspirin: Secondary | ICD-10-CM | POA: Insufficient documentation

## 2012-04-08 DIAGNOSIS — I452 Bifascicular block: Secondary | ICD-10-CM | POA: Insufficient documentation

## 2012-04-08 DIAGNOSIS — Z87891 Personal history of nicotine dependence: Secondary | ICD-10-CM | POA: Insufficient documentation

## 2012-04-08 DIAGNOSIS — E669 Obesity, unspecified: Secondary | ICD-10-CM | POA: Insufficient documentation

## 2012-04-08 DIAGNOSIS — I2789 Other specified pulmonary heart diseases: Secondary | ICD-10-CM | POA: Insufficient documentation

## 2012-04-08 DIAGNOSIS — I428 Other cardiomyopathies: Secondary | ICD-10-CM | POA: Insufficient documentation

## 2012-04-08 DIAGNOSIS — R9439 Abnormal result of other cardiovascular function study: Secondary | ICD-10-CM | POA: Insufficient documentation

## 2012-04-08 DIAGNOSIS — J4489 Other specified chronic obstructive pulmonary disease: Secondary | ICD-10-CM | POA: Insufficient documentation

## 2012-04-08 DIAGNOSIS — Z6828 Body mass index (BMI) 28.0-28.9, adult: Secondary | ICD-10-CM | POA: Insufficient documentation

## 2012-04-08 DIAGNOSIS — Z8249 Family history of ischemic heart disease and other diseases of the circulatory system: Secondary | ICD-10-CM | POA: Insufficient documentation

## 2012-04-08 DIAGNOSIS — I509 Heart failure, unspecified: Secondary | ICD-10-CM | POA: Insufficient documentation

## 2012-04-08 SURGERY — RIGHT & LEFT HEART CATH
Anesthesia: Conscious Sedation

## 2012-04-08 MED ORDER — FENTANYL CITRATE 0.05 MG/ML IJ SOLN
INTRAMUSCULAR | Status: AC
Start: 2012-04-08 — End: 2012-04-08
  Administered 2012-04-08: 50 ug via INTRAVENOUS
  Filled 2012-04-08: qty 2

## 2012-04-08 MED ORDER — MIDAZOLAM HCL 2 MG/2ML IJ SOLN
INTRAMUSCULAR | Status: AC
Start: 2012-04-08 — End: 2012-04-08
  Administered 2012-04-08: 2 mg via INTRAVENOUS
  Filled 2012-04-08: qty 2

## 2012-04-08 MED ORDER — ACETAMINOPHEN 325 MG PO TABS
650.0000 mg | ORAL_TABLET | ORAL | Status: DC | PRN
Start: 2012-04-08 — End: 2012-04-08

## 2012-04-08 MED ORDER — SODIUM CHLORIDE 0.9 % IV SOLN
INTRAVENOUS | Status: AC
Start: 2012-04-08 — End: 2012-04-08

## 2012-04-08 MED ORDER — LIDOCAINE HCL (PF) 1 % IJ SOLN
INTRAMUSCULAR | Status: AC
Start: 2012-04-08 — End: 2012-04-08
  Filled 2012-04-08: qty 1

## 2012-04-08 MED ORDER — SODIUM CHLORIDE 0.9 % IV SOLN
100.0000 mL/h | INTRAVENOUS | Status: AC
Start: 2012-04-08 — End: 2012-04-08

## 2012-04-08 MED ORDER — IODIXANOL 320 MG/ML IV SOLN
60.00 mL | Freq: Once | INTRAVENOUS | Status: DC | PRN
Start: 2012-04-08 — End: 2012-04-08

## 2012-04-08 MED ORDER — HEPARIN WASH BOWL 5 UNITS/ML SOLN (CATH LAB)
Status: AC
Start: 2012-04-08 — End: 2012-04-08
  Filled 2012-04-08: qty 2

## 2012-04-08 NOTE — Progress Notes (Signed)
Pt. Ambulated twice w/o difficulty.   Voided.   Right groin site c/d/i, no hematoma.  Pt. Denies pain.  Pt. Verbalized understanding of all discharge instructions.  Iv and monitor discontinued, and patient escorted to vehicle in [facility].

## 2012-04-08 NOTE — Brief Op Note (Signed)
PRELIMINARY CATH NOTE  [facility]    Date Time: 04/08/2012 7:59 AM    Patient Name:   Sabrina Church    Date of Cardiac Cath:   04/08/2012    Providers Performing:   Surgeon(s):  Ty Hilts, MD      Operative Procedure:   LHC, RHC and CORS    Preoperative Diagnosis:   cardiomyopathy and pulmonary HTN    Postoperative Diagnosis:   cardiomyopathy and pulmonary HTN  cardiomyopathy      Anesthesia:     Fentanyl 50 mcg and Midazolam 2 mg      Stents   * No implants in log *      Findings:   LVEF 40%  Normal coronaries  Moderate PHT    Complications:   none    Plan:   Medical therapy  FU with pulmonary        Signed by: Ty Hilts, MD                                                                              FX CARDIAC CATH        [facility]  NP Spectralink [phone] (8am-5pm)  MD Spectralink [phone] or [phone] (8am-5pm)  Arrhythmia Spectralink [phone] (8am-4:30pm)  After hours, non urgent consult line [phone]  After Hours, urgent consults [phone]

## 2012-04-08 NOTE — Discharge Instructions (Signed)
[facility]                                                                    [address]  [place], [place]                                                 Interventional Cardiovascular Admission and Recovery                                                                 Post Catheterization Discharge Instructions                                                                                   Groin Access    Closure Device: ___________________________         Access Site:______________________    Activity:  1. No driving for 24 hours following the procedure due to medications you may have received.   2. Rest today and tomorrow, gradually increasing to your usual activities.  3. Limit stair usage for the next 24 hours. If you must use the stairs, take them one at a time, leading with your unaffected leg holding pressure on the bandaged site.  4. Do not lift anything over 10 pounds in weight for five (5) days. That includes pushing, pulling, dragging or moving anything.  5. No strenuous activity for five (5) days. Do not attempt anything that may cause fatigue, shortness of breath, perspiration or chest pain.   6. Support the bandaged site when coughing or sneezing. Do not strain when having a bowel movement.     Access Site Care:  1. No tub baths, hot tubs, pools or sitting in water for one week.  2. You may shower 24 hours after your procedure. Leave the bandage in place and just let the water passively flow over the site.  3. REMOVE the dressing 48 hours after your procedure, either before or during your shower. Again, let the water passively flow over the site, wash gently with your hand, then pat the area dry.   4. Do not rub, pick or scratch the area.   5. Do not apply creams, powders, lotions, or ointments to the site.   6. Apply a regular sized Band-Aid to the puncture site and change it daily for five (5) days.  You may shower daily.  7. If the puncture site looks like it is becoming infected or not healing properly, (red, hot to touch, drainage, and/or fever over 101 degrees F), call the doctor who performed the procedure.       Normal Observation:  1. You may feel tenderness.   2. You may experience some mild bruising.   3. You may feel a small lump, about the size of an olive pit, which should disappear within 90 days if a closure device is used.   Call 911 if:  1. You are experiencing unrelieved chest pain.  2. You notice bleeding either through the dressing or underneath the skin. If the blood is trapped under the skin, the area will hurt, become swollen and hard. If either happens, lay down flat and hold pressure on the site. This is an arterial bleed, and may become an emergency if unattended.   3. Your leg becomes cold, numb, painful, grayish in color, or change from usual color/sensation.

## 2012-04-08 NOTE — Plan of Care (Signed)
Pre- Cath Teaching and Learning Objectives   Learner: Carlena Hurl Escareno,   Preference for learning: Verbal  Teaching Method: Verbal Instruction   Outcome of Learning: Fully Achieved    Described/Demonstrated the following:     + Responsibilities of patient's care  + Cardiac Cath/PTCA/Stent  + Purpose of procedure  + Need to be NPO pre-procedure  + Need for maintaining bedrest & straight leg post-procedure & sheath removal.  + Necessary fluid intake after procedure  + Symptoms of bleeding & states plan to notify nurse.

## 2012-05-04 DIAGNOSIS — G4733 Obstructive sleep apnea (adult) (pediatric): Secondary | ICD-10-CM

## 2012-05-04 HISTORY — DX: Obstructive sleep apnea (adult) (pediatric): G47.33

## 2012-06-16 ENCOUNTER — Ambulatory Visit: Payer: Medicare Other

## 2012-06-22 ENCOUNTER — Ambulatory Visit: Payer: Medicare Other | Attending: Pulmonary Disease

## 2012-06-22 DIAGNOSIS — G4733 Obstructive sleep apnea (adult) (pediatric): Secondary | ICD-10-CM | POA: Insufficient documentation

## 2012-06-22 DIAGNOSIS — R5383 Other fatigue: Secondary | ICD-10-CM | POA: Insufficient documentation

## 2012-06-22 DIAGNOSIS — R5381 Other malaise: Secondary | ICD-10-CM | POA: Insufficient documentation

## 2012-06-23 ENCOUNTER — Other Ambulatory Visit: Payer: Self-pay

## 2012-06-23 NOTE — Miscellaneous (Signed)
Patient is here for NPSG. Pt complains of depression and high blood pressure. Patient recently loss her husband to cancer and says that she has been DX with broken heart syndrome. Tech did notice irregular heart beats including PVCs and PACs, heart rate was normal at 80 bpm. Patient also has low oxygen saturations. Tech noticed mild to moderate OSA mostly OA with snoring. Patient also has audible breathing while awake. Patient's lowest oxygen level was 65%.  06/22/2012, Odis Luster, RPSGT

## 2012-06-23 NOTE — Procedures (Signed)
See tech notes section for any additional notes.

## 2012-06-28 NOTE — Procedures (Signed)
Service Date: 06/22/2012     Patient Type: O     PHYSICIAN/PROVIDER: Varney Baas MD     REFERRING PHYSICIAN:      PREOPERATIVE DIAGNOSIS:  Rule out obstructive sleep apnea.     POSTOPERATIVE DIAGNOSES:  Mild obstructive sleep apnea with an overall apnea-hypopnea index of 8.1;  however, in rapid eye movement sleep, her apnea-hypopnea index was 47.   Also, in rapid eye movement sleep, she was hypoxemic to the low 80s without events and   high 60s with the events.  When she was in non-rapid eye movement sleep,  her oxygen saturation remained in the 88 to 90 range.     DESCRIPTION OF PROCEDURE:  This polysomnogram was performed in the usual manner recording EEG, EOG,  chin EMG, EKG, nasal pressure air flow, chest EEG, chest and abdominal  movement with piezoelectric belts, snoring, leg EMG, oxygen saturation,  pulse rate, and a body position.  The study was videotaped via an infrared  TV camera.     PATIENT HISTORY:  Sabrina Church is 5 feet 4 inches tall and weighs 170 pounds, giving her a  body mass index of 29.4.  She has fatigue, but denies snoring or excess  daytime sleepiness.  Epworth Sleepiness Score is 1.     INTERPRETATION:  The patient was found to have REM sleep dependent obstructive sleep apnea  with total overall AHI of 8.1 and a REM sleep AHI of 47.  The patient had a  borderline low oxygen saturation while in non-REM sleep between 88% and  90%.  When she went into REM sleep, her baseline oxygen saturation dropped  into the mid to low 80s and with the events in REM sleep, it dropped into  the high 60s.  The patient had no periodic leg movements of sleep.  There  were no EEG abnormalities.  Sleep latency was normal.  Sleep efficiency was  mildly impaired.  REM latency was moderately prolonged.  Sleep stage  distribution showed an increased stage 1 sleep, normal amount of stage 2  sleep, normal amount of slow-wave sleep and decreased REM sleep.  Cardiac  rhythm was normal sinus with an occasional PVC.      RECOMMENDATIONS:  It was recommended that the patient return for a CPAP titration.           D:  06/28/2012 16:52 PM by Dr. Varney Baas, MD [phone])  T:  06/28/2012 17:38 PM by [id number]      Everlean Cherry: [id number]) (Doc ID: [id number])

## 2012-07-16 ENCOUNTER — Ambulatory Visit: Payer: Medicare Other | Attending: Pulmonary Disease

## 2012-07-16 DIAGNOSIS — G4733 Obstructive sleep apnea (adult) (pediatric): Secondary | ICD-10-CM | POA: Insufficient documentation

## 2012-07-17 NOTE — Miscellaneous (Signed)
Mild to moderate snoring, positional at times. Hypopneic activity with mild desaturations, treated at final setting of 10cmH2O. Pt tolerated mask and pressures well, felt extremely refreshed in comparison to previous mornings without PAP therapy.

## 2012-07-19 ENCOUNTER — Other Ambulatory Visit: Payer: Self-pay

## 2012-07-19 DIAGNOSIS — G4733 Obstructive sleep apnea (adult) (pediatric): Secondary | ICD-10-CM

## 2012-07-19 HISTORY — DX: Obstructive sleep apnea (adult) (pediatric): G47.33

## 2012-07-19 NOTE — Procedures (Signed)
See tech notes section for any additional notes.

## 2012-07-28 NOTE — Procedures (Signed)
Service Date: 07/17/2012     Patient Type: O     PHYSICIAN/PROVIDER: Varney Baas MD     REFERRING PHYSICIAN:      TITLE OF PROCEDURE:     CPAP polysomnogram.     DESCRIPTION OF PROCEDURE:  This polysomnogram was done in the usual manner, recording EEG, EOG, EMG,  EKG, nasal and oral airflow.  Respiratory parameters of chest and abdominal  movements were recorded with piezoelectric belt.  Oxygen saturation was  recorded by pulse oximetry and CPAP pressures were entered manually.  The  patient's study was recorded via an infrared TV camera.     INTERPRETATION:  The patient was fitted with a small ResMed Mirage Quattro FX for her, full  face mask, and CPAP was titrated from 4 to 10 cm of water.  At 10 cm of  water her sleep disordered breathing was adequately controlled and  hypoxemia was controlled with oxygen saturations in the low 90s.  Periodic leg movements of sleep that showed an index of 28.4 and an arousal index of 0.  This is consistent with mild periodic leg movements of  sleep.  There were no EEG abnormalities.  Sleep onset was normal.  Sleep  efficiency was normal.  REM latency was prolonged, on Eclipse no REM sleep  occurred at higher CPAP levels.  The sleep stage distribution showed a  normal amount of stage 1 sleep, normal amount of stage 2 sleep, normal  amount of slow-wave sleep, and a moderate decrease in REM sleep.  Cardiac  rhythm was normal sinus with an occasional PVC.     RECOMMENDATIONS:  The patient should have home auto-titrating CPAP from 4 to 20.  As there  was evidence of a persistent snoring at the CPAP of 10 she might have been  a bit under titrated.           D:  07/27/2012 17:27 PM by Dr. Varney Baas, MD [phone])  T:  07/28/2012 01:09 AM by [facility]      Everlean Cherry: [id number]) (Doc ID: [id number])

## 2013-07-26 ENCOUNTER — Other Ambulatory Visit: Payer: Self-pay | Admitting: Cardiovascular Disease

## 2013-07-26 DIAGNOSIS — I509 Heart failure, unspecified: Secondary | ICD-10-CM

## 2013-08-01 ENCOUNTER — Ambulatory Visit
Admission: RE | Admit: 2013-08-01 | Discharge: 2013-08-01 | Disposition: A | Discharge status: Home / Self Care | Payer: Medicare Other | Source: Ambulatory Visit | Attending: Cardiovascular Disease | Admitting: Cardiovascular Disease

## 2013-08-01 DIAGNOSIS — I2789 Other specified pulmonary heart diseases: Secondary | ICD-10-CM | POA: Insufficient documentation

## 2013-08-01 DIAGNOSIS — I509 Heart failure, unspecified: Secondary | ICD-10-CM | POA: Insufficient documentation

## 2013-12-06 ENCOUNTER — Other Ambulatory Visit: Payer: Self-pay | Admitting: Cardiovascular Disease

## 2013-12-06 DIAGNOSIS — I6529 Occlusion and stenosis of unspecified carotid artery: Secondary | ICD-10-CM

## 2013-12-14 ENCOUNTER — Ambulatory Visit
Admission: RE | Admit: 2013-12-14 | Discharge: 2013-12-14 | Disposition: A | Discharge status: Home / Self Care | Payer: Medicare Other | Source: Ambulatory Visit | Attending: Cardiovascular Disease | Admitting: Cardiovascular Disease

## 2013-12-14 DIAGNOSIS — I658 Occlusion and stenosis of other precerebral arteries: Secondary | ICD-10-CM | POA: Insufficient documentation

## 2013-12-14 DIAGNOSIS — R0989 Other specified symptoms and signs involving the circulatory and respiratory systems: Secondary | ICD-10-CM | POA: Insufficient documentation

## 2013-12-14 DIAGNOSIS — I6529 Occlusion and stenosis of unspecified carotid artery: Secondary | ICD-10-CM | POA: Insufficient documentation

## 2014-12-11 ENCOUNTER — Other Ambulatory Visit: Payer: Self-pay | Admitting: Cardiovascular Disease

## 2014-12-11 DIAGNOSIS — I6523 Occlusion and stenosis of bilateral carotid arteries: Secondary | ICD-10-CM

## 2014-12-13 ENCOUNTER — Ambulatory Visit (INDEPENDENT_AMBULATORY_CARE_PROVIDER_SITE_OTHER): Payer: Self-pay

## 2014-12-13 ENCOUNTER — Ambulatory Visit
Admission: RE | Admit: 2014-12-13 | Discharge: 2014-12-13 | Disposition: A | Discharge status: Home / Self Care | Payer: Medicare Other | Source: Ambulatory Visit | Attending: Cardiovascular Disease | Admitting: Cardiovascular Disease

## 2014-12-13 DIAGNOSIS — I6523 Occlusion and stenosis of bilateral carotid arteries: Secondary | ICD-10-CM | POA: Insufficient documentation

## 2014-12-19 ENCOUNTER — Ambulatory Visit (INDEPENDENT_AMBULATORY_CARE_PROVIDER_SITE_OTHER): Payer: Self-pay | Admitting: Cardiology

## 2015-04-08 ENCOUNTER — Ambulatory Visit
Admission: RE | Admit: 2015-04-08 | Discharge: 2015-04-08 | Disposition: A | Discharge status: Home / Self Care | Payer: Medicare Other | Source: Ambulatory Visit | Attending: Pulmonary Disease | Admitting: Pulmonary Disease

## 2015-04-08 VITALS — BP 115/58 | HR 69 | Resp 14 | Ht 63.0 in | Wt 181.8 lb

## 2015-04-08 DIAGNOSIS — J449 Chronic obstructive pulmonary disease, unspecified: Secondary | ICD-10-CM | POA: Insufficient documentation

## 2015-04-08 HISTORY — DX: Other forms of dyspnea: R06.09

## 2015-04-08 HISTORY — DX: Other seasonal allergic rhinitis: J30.2

## 2015-04-08 HISTORY — DX: Unspecified asthma, uncomplicated: J45.909

## 2015-04-08 HISTORY — DX: Essential (primary) hypertension: I10

## 2015-04-08 HISTORY — DX: Pulmonary fibrosis, unspecified: J84.10

## 2015-04-08 NOTE — Progress Notes (Signed)
Pulmonary Rehabilitation Assessment and Six Minute Walk   Initial Assessment  04/08/2015   Start Time:  1300   End Time:  1530   Total Time:  150 minutes   Supervising MD readily available:  MD Consuela Mimes    Referral Diagnosis:  COPD   Sabrina Church understands she has COPD, Asthma, Pulmonary Fibrosis   No chief complaint on file.    Duration of onset:  Asthma started in childhood, cannot recall when COPD started, Pulmonary Fibrosis was mentioned recently  Sabrina Humphrey, Md  [address]  [place], [place] [address]  Hazle Coca, MD  Allergies   Allergen Reactions   . Ceclor [Cefaclor] Hives     Current Outpatient Rx   Name  Route  Sig  Dispense  Refill   . aspirin EC 81 MG EC tablet    Oral    Take 81 mg by mouth daily.             Marland Kitchen atorvastatin (LIPITOR) 80 MG tablet    Oral    Take 80 mg by mouth daily.             . carvedilol (COREG) 25 MG tablet    Oral    Take 25 mg by mouth.             . fluticasone (FLOVENT DISKUS) 50 MCG/BLIST diskus inhaler    Inhalation    Inhale 250 mcg into the lungs daily.             . furosemide (LASIX) 20 MG tablet    Oral    Take 20 mg by mouth 2 (two) times daily.             Marland Kitchen levalbuterol (XOPENEX HFA) 45 MCG/ACT inhaler    Inhalation    Inhale 1-2 puffs into the lungs every 4 (four) hours as needed.             Marland Kitchen lisinopril (PRINIVIL,ZESTRIL) 2.5 MG tablet    Oral    Take 2.5 mg by mouth daily.             . magnesium oxide (MAG-OX) 400 MG tablet    Oral    Take 400 mg by mouth daily.             Marland Kitchen tiotropium (SPIRIVA) 18 MCG inhalation capsule    Inhalation    Place 18 mcg into inhaler and inhale daily.               Past Medical History   Diagnosis Date   . COPD (chronic obstructive pulmonary disease)    . Irregular heart beat    . Seasonal allergic rhinitis    . Asthma    . Pulmonary fibrosis    . 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 Dyspnea on exertion    . Hypertension    . 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 Hysterectomy     . Proaspe     . Tubal ligation Bilateral      Social History   Substance Use Topics   . Smoking status: Former Smoker -- 1.50 packs/day for 40 years     Quit date: 12/09/1998   . Smokeless tobacco: Not on file   . Alcohol Use: No  Filed Vitals:    04/08/15 1100   BP: 115/58   Pulse: 69   Resp: 14   Height: 1.6 m (5\' 3" )   Weight: 82.464 kg (181 lb 12.8 oz)   SpO2: 97%     Body mass index is 32.21 kg/(m^2).   Symptoms:   Cough:  all the time, dry, worse with exposure to cold air, no apparent relationship to exercise, not related to position, has not responded to bronchodilator in the past  Sputum:  denies chronic phlegm, only when ill  Wheeze:  yes; cause onset mental stress  Dyspnea:  at rest, ambulating about the house, engaging in housework, climbing 1 flight of stairs and emotional stress  Fluid retention:  none  Clubbing:  no  Cyanosis:  no    Sleeping problems:  no  Number of hours sleep per night:  8   Extra pillows:  yes - uses one pillow, but has a bed that raises to help with breathing    Breathing Retraining needed:    Patient uses accessory muscles:  yes  Pursed Lip Breathing Training:  yes  Diaphragm Breathing Training:  yes    MMRC Dyspnea Index:    2  Borg Dyspnea Scale:   0.5    Occupation:  Currently working:  no, retirement/disability date: 1976  Occupational exposures:  chemicals, gas/fumes and remote history of owning parakeets    Current functional activity level:  moderately active  Has a DMV Placard:  no  Qualify:  no  Difficulty with ADLs:  walking  Stairs at home:  no       Variables Affecting Learning:    Vision:  Yes, night vision issues  Glasses:  Yes, to use for night vision  Hearing aid:  no  Language:  no  Ethnic/cultural diversity:  no    Vaccines:  Flu - yes, Date: 2016, Pneumonia - yes, Date: 2016 and Zostavax (Shingles) - yes, Date: around 2008    Respiratory Infections past year:  5, antibiotic used:  yes -  azithromycin  Hospitalizations/ED past year:  no  Have you attending Pulmonary Rehab before?  no  Need training in warning signal, infection prevention:  yes    Stress Management  Stressors:  death, divorce/separation and illness or family illness  Relaxation techniques:  Play cards, watch movies, play computer games, play sudoku, listen music, spending time with children and grandchildren, talking on phone with family members    Aerosol Therapy:  Nebulizer:  no    Sleep Study:  yes  Date:  February 2014   Epworth Sleepiness Scale:  2    BIPAP/CPAP:  No, although pulmonologist would prefer it; DME has declined to provide    Oxygen Therapy:  yes - [facility]   System:  Stationary:  concentrated and Portable:   pulsedose   L/M: Ordered:  2L for sleep  L/M Used:  sleep  2    Rescue Inhaler:  yes - Xopenex  Uses holding chamber:  no  Needs training:  yes    Fluid Intake:   water - 8 glasses per day  Tonic Water-1 glass 3-4 times per week   Nutrition:  Appetite - good   Special diet - yes, trying to go low carb (reports little success)  Salt use - yes, very little  Multiple vitamin use - yes     Functional Goals:  Improve muscular strength, lose weight, develop a lifelong exercise habit.  (see timed distance walk test for additional  objective data:  BS, BP, BORG, etc.)    Pulmonary Functions Test Results:  Date:  12/25/14  FEV1:  44%  FEV1/FVC:  65  DLCO:  34%  TLC:  78%       RV: 115%         Timed Distance Walk Test:  See results of today's .    Assessment:  Increased work of breathing  Deconditioned difficulty with ADLs  Difficulty with stair climbing  Patient needs education on disease process    Plan:  Follow through with MD Pulmonary Rehabilitation order  Teach breathing retraining  Teach energy conservation/pacing  Stair climbing instructions  Educate patient on disease process

## 2015-04-10 ENCOUNTER — Inpatient Hospital Stay
Admission: RE | Admit: 2015-04-10 | Discharge: 2015-04-10 | Disposition: A | Discharge status: Home / Self Care | Payer: Medicare Other | Source: Ambulatory Visit | Attending: Pulmonary Disease | Admitting: Pulmonary Disease

## 2015-04-10 DIAGNOSIS — J449 Chronic obstructive pulmonary disease, unspecified: Secondary | ICD-10-CM | POA: Insufficient documentation

## 2015-04-12 ENCOUNTER — Inpatient Hospital Stay: Admission: RE | Admit: 2015-04-12 | Payer: Medicare Other | Source: Ambulatory Visit

## 2015-04-12 NOTE — Progress Notes (Signed)
Pt did not attend Pulm Rehab today due to illness

## 2015-04-15 ENCOUNTER — Inpatient Hospital Stay
Admission: RE | Admit: 2015-04-15 | Discharge: 2015-04-15 | Disposition: A | Discharge status: Home / Self Care | Payer: Medicare Other | Source: Ambulatory Visit

## 2015-04-15 DIAGNOSIS — J449 Chronic obstructive pulmonary disease, unspecified: Secondary | ICD-10-CM

## 2015-04-17 ENCOUNTER — Inpatient Hospital Stay
Admission: RE | Admit: 2015-04-17 | Discharge: 2015-04-17 | Disposition: A | Discharge status: Home / Self Care | Payer: Medicare Other | Source: Ambulatory Visit

## 2015-04-17 DIAGNOSIS — J449 Chronic obstructive pulmonary disease, unspecified: Secondary | ICD-10-CM

## 2015-04-19 ENCOUNTER — Inpatient Hospital Stay
Admission: RE | Admit: 2015-04-19 | Discharge: 2015-04-19 | Disposition: A | Discharge status: Home / Self Care | Payer: Medicare Other | Source: Ambulatory Visit

## 2015-04-19 DIAGNOSIS — J449 Chronic obstructive pulmonary disease, unspecified: Secondary | ICD-10-CM

## 2015-04-22 ENCOUNTER — Inpatient Hospital Stay
Admission: RE | Admit: 2015-04-22 | Discharge: 2015-04-22 | Disposition: A | Discharge status: Home / Self Care | Payer: Medicare Other | Source: Ambulatory Visit

## 2015-04-22 DIAGNOSIS — J449 Chronic obstructive pulmonary disease, unspecified: Secondary | ICD-10-CM

## 2015-04-24 ENCOUNTER — Inpatient Hospital Stay
Admission: RE | Admit: 2015-04-24 | Discharge: 2015-04-24 | Disposition: A | Discharge status: Home / Self Care | Payer: Medicare Other | Source: Ambulatory Visit

## 2015-04-24 DIAGNOSIS — J449 Chronic obstructive pulmonary disease, unspecified: Secondary | ICD-10-CM

## 2015-04-26 ENCOUNTER — Inpatient Hospital Stay: Admission: RE | Admit: 2015-04-26 | Payer: Medicare Other | Source: Ambulatory Visit

## 2015-04-26 NOTE — Progress Notes (Signed)
Patient is not attending Pulmonary Rehab today due to guests.

## 2015-05-01 ENCOUNTER — Inpatient Hospital Stay
Admission: RE | Admit: 2015-05-01 | Discharge: 2015-05-01 | Disposition: A | Discharge status: Home / Self Care | Payer: Medicare Other | Source: Ambulatory Visit

## 2015-05-01 DIAGNOSIS — J449 Chronic obstructive pulmonary disease, unspecified: Secondary | ICD-10-CM

## 2015-05-03 ENCOUNTER — Inpatient Hospital Stay: Admission: RE | Admit: 2015-05-03 | Payer: Medicare Other | Source: Ambulatory Visit

## 2015-05-03 NOTE — Progress Notes (Signed)
Patient is not attending Pulmonary Rehab today due to acute diarrhea not resolving today with imodium.

## 2015-05-08 ENCOUNTER — Inpatient Hospital Stay
Admission: RE | Admit: 2015-05-08 | Discharge: 2015-05-08 | Disposition: A | Discharge status: Home / Self Care | Payer: Medicare Other | Source: Ambulatory Visit | Attending: Pulmonary Disease | Admitting: Pulmonary Disease

## 2015-05-08 DIAGNOSIS — J449 Chronic obstructive pulmonary disease, unspecified: Secondary | ICD-10-CM

## 2015-05-08 NOTE — Progress Notes (Signed)
Patient reports still feeling unwell and having difficulty with sleep due to coughing "all night."     During aerobic exercise the patient's oxygen saturation dropped below 90% at times and was substantially lower overall than her normal.     Patient agreed to call her pulmonologist and see what they recommend to help her recover more quickly.

## 2015-05-09 ENCOUNTER — Inpatient Hospital Stay
Admission: RE | Admit: 2015-05-09 | Discharge: 2015-05-09 | Disposition: A | Discharge status: Home / Self Care | Payer: Medicare Other | Source: Ambulatory Visit

## 2015-05-09 NOTE — Progress Notes (Signed)
Pulmonary Rehab Nutrition Assessment:    Assessment: Quinisha Mould Bochenek is a(n) 75 y.o. female who presents with COPD, pulmonary fibrosis, HTN, s/p death of spouse broken heart syndrome  Meds: ASA, lipitor, coreg, lasix, mag ox, spiriva    Labs:  Chol:  No components found for: CHOLESTEROL  LDL:     Lab Results   Component Value Date    LDL 106* 02/09/2012      HDL:     Lab Results   Component Value Date    HDL 34* 02/09/2012     TG:   No results found for: LABPROT  Glucose/HG1AC:   No components found for: HG1AC    GI:  Indigestion; milk and ice cream, spring mix lettuce    Anthropometrics:  Ht: 160 cm  Wt: 82.46 kg; 181 lbs  BMI: 32.3  IBW: 52.4 kg; 115 lbs  Weight Goal: lose 0.5-2 lbs per week (gained 20 lbs since October)    Diet History:  Fluid Intake: water, tea and juice  Use salt at the table:  no  Use salt cooking:  yes ; soups, tries to reduce  Current diet prescription:  no  Food allergies/intolerances: dislikes mushrooms; GI intolerance to milk and ice cream, spring mix lettuce  How often do you eat out/eat at home: 2x per month; [employer] (6 oz fillet, salad, mashed potatoes)  Food Log:yes  Rate Your Plate:   Score: 54   58 - 72: You are making many healthy choices  41 - 57: There are some ways you can make your eating habits healthier   24 - 40: There are many ways you can make your eating habits healthier.   Goals ready to work on: [employer] wisely, eat at the table instead of TV, set a time each night to start relaxing instead of eating (example: 8 PM - aim for 2-3 hours before bed; try strategies like brushing teeth, putting mint in after dinner,  etc.)    Nutrition Diagnosis:   Designer, jewellery and nutrition-related knowledge deficit related to lung health and weight loss as evidenced by buying sweet treats/desserts/candy, eating late into the evening, small meals/snack for breakfast/lunch, eating in front of TV, mindless eating    Intervention:   Goal #1: Grocery shopping  wisely to eat balanced meals and snacks  Action Plan: Snack on a handful of nuts (1/4 cup), Skinny Pop, fruit,  cup mixed beans in olive oil/herbs/spices. Choose snacks if needed that fill you with protein/fiber and fuel your body and lungs (4 small meals or spacing out meals reduces pressure on diaphragm). Use the visual of MyPlate to include fruit at breakfast (and snack), 1-2 servings of vegetables at lunch and dinner, include high fiber starch at each meal, and include protein - plant-based or lean protein. Ex: stir fry with chicken, veggies (broccoli, carrots, green beans, etc.), 3/4-1 cup brown or wild rice/quinoa/whole wheat couscous, etc. Ex 2: chili with >93% lean ground beef, can include 3 different beans, peppers, onions, spinach, herbs/spices, etc. Use handouts for weight loss tips and balancing meals for lung health.     Goal #2: Create healthy habits in the evening - eat at the table, give yourself at least 2-3 hours prior to bed to wind down  Action Plan: Eat at the table to eat more mindfully instead of in front of the TV. set a time each night to start relaxing instead of eating (example: 8 PM or 9 PM - aim for 2-3  hours before bed; try strategies like brushing teeth, putting mint in after dinner,  etc.)    Guidelines Provided: MyPlate, Nutrition facts label, Lung health, Weight loss, Hypertension/salt handout  Other Recommendations: Follow-up as needed in 1 month    Monitoring/Evaluation:  Understanding and Motivation to Follow Recommendations:  good     Berlin Hun

## 2015-05-10 ENCOUNTER — Inpatient Hospital Stay
Admission: RE | Admit: 2015-05-10 | Discharge: 2015-05-10 | Disposition: A | Discharge status: Home / Self Care | Payer: Medicare Other | Source: Ambulatory Visit

## 2015-05-10 DIAGNOSIS — J449 Chronic obstructive pulmonary disease, unspecified: Secondary | ICD-10-CM

## 2015-05-13 ENCOUNTER — Inpatient Hospital Stay
Admission: RE | Admit: 2015-05-13 | Discharge: 2015-05-13 | Disposition: A | Discharge status: Home / Self Care | Payer: Medicare Other | Source: Ambulatory Visit

## 2015-05-13 DIAGNOSIS — J449 Chronic obstructive pulmonary disease, unspecified: Secondary | ICD-10-CM

## 2015-05-15 ENCOUNTER — Inpatient Hospital Stay
Admission: RE | Admit: 2015-05-15 | Discharge: 2015-05-15 | Disposition: A | Discharge status: Home / Self Care | Payer: Medicare Other | Source: Ambulatory Visit

## 2015-05-15 DIAGNOSIS — J449 Chronic obstructive pulmonary disease, unspecified: Secondary | ICD-10-CM

## 2015-05-15 NOTE — Addendum Note (Signed)
Encounter addended by: Kallie Edward on: 05/15/2015  4:55 PM<BR>     Documentation filed: Inpatient Document Flowsheet

## 2015-05-16 NOTE — Addendum Note (Signed)
Encounter addended by: Kallie Edward on: 05/16/2015 10:10 AM<BR>     Documentation filed: Inpatient Document Flowsheet

## 2015-05-17 ENCOUNTER — Inpatient Hospital Stay
Admission: RE | Admit: 2015-05-17 | Discharge: 2015-05-17 | Disposition: A | Discharge status: Home / Self Care | Payer: Medicare Other | Source: Ambulatory Visit

## 2015-05-17 DIAGNOSIS — J449 Chronic obstructive pulmonary disease, unspecified: Secondary | ICD-10-CM

## 2015-05-20 ENCOUNTER — Inpatient Hospital Stay
Admission: RE | Admit: 2015-05-20 | Discharge: 2015-05-20 | Disposition: A | Discharge status: Home / Self Care | Payer: Medicare Other | Source: Ambulatory Visit

## 2015-05-20 DIAGNOSIS — J449 Chronic obstructive pulmonary disease, unspecified: Secondary | ICD-10-CM

## 2015-05-20 NOTE — Progress Notes (Signed)
Patient plans to attend yoga today from 1400-1445.

## 2015-05-22 ENCOUNTER — Inpatient Hospital Stay
Admission: RE | Admit: 2015-05-22 | Discharge: 2015-05-22 | Disposition: A | Discharge status: Home / Self Care | Payer: Medicare Other | Source: Ambulatory Visit

## 2015-05-22 DIAGNOSIS — J449 Chronic obstructive pulmonary disease, unspecified: Secondary | ICD-10-CM

## 2015-05-22 NOTE — Addendum Note (Signed)
Encounter addended by: Alda Ponder D, RT on: 05/22/2015  4:52 PM<BR>     Documentation filed: Inpatient Patient Education

## 2015-05-24 ENCOUNTER — Inpatient Hospital Stay
Admission: RE | Admit: 2015-05-24 | Discharge: 2015-05-24 | Disposition: A | Discharge status: Home / Self Care | Payer: Medicare Other | Source: Ambulatory Visit

## 2015-05-24 DIAGNOSIS — J449 Chronic obstructive pulmonary disease, unspecified: Secondary | ICD-10-CM

## 2015-05-27 ENCOUNTER — Inpatient Hospital Stay
Admission: RE | Admit: 2015-05-27 | Discharge: 2015-05-27 | Disposition: A | Discharge status: Home / Self Care | Payer: Medicare Other | Source: Ambulatory Visit

## 2015-05-27 DIAGNOSIS — J438 Other emphysema: Secondary | ICD-10-CM

## 2015-05-29 ENCOUNTER — Inpatient Hospital Stay
Admission: RE | Admit: 2015-05-29 | Discharge: 2015-05-29 | Disposition: A | Discharge status: Home / Self Care | Payer: Medicare Other | Source: Ambulatory Visit

## 2015-05-29 VITALS — BP 122/58 | HR 61 | Resp 20 | Ht 62.99 in | Wt 180.2 lb

## 2015-05-29 DIAGNOSIS — J438 Other emphysema: Secondary | ICD-10-CM

## 2015-05-29 NOTE — Addendum Note (Signed)
Encounter addended by: Alda Ponder D, RT on: 05/29/2015  5:30 PM<BR>     Documentation filed: Notes Section

## 2015-05-29 NOTE — Addendum Note (Signed)
Encounter addended by: Alda Ponder D, RT on: 05/29/2015  7:33 PM<BR>     Documentation filed: Charges VN, Inpatient Patient Education, Inpatient Document Flowsheet

## 2015-05-29 NOTE — Progress Notes (Signed)
Patient reports planning to continue exercise after Pulmonary Rehab ends by attending either [facility] or [facility] gym.

## 2015-05-31 ENCOUNTER — Inpatient Hospital Stay
Admission: RE | Admit: 2015-05-31 | Discharge: 2015-05-31 | Disposition: A | Discharge status: Home / Self Care | Payer: Medicare Other | Source: Ambulatory Visit

## 2015-05-31 DIAGNOSIS — J438 Other emphysema: Secondary | ICD-10-CM

## 2015-05-31 NOTE — Progress Notes (Signed)
Patient desaturated to 88% on NuStep and 86% with walking. She has desaturated during exercise in the past, but not every time. She reports that she feels okay, no new symptoms. However, she thinks that exercising in the morning is hard for her and she never feels as good as she does in the afternoons. Desaturation possibly due to time of day.

## 2015-06-03 ENCOUNTER — Inpatient Hospital Stay
Admission: RE | Admit: 2015-06-03 | Discharge: 2015-06-03 | Disposition: A | Discharge status: Home / Self Care | Payer: Medicare Other | Source: Ambulatory Visit

## 2015-06-03 DIAGNOSIS — J449 Chronic obstructive pulmonary disease, unspecified: Secondary | ICD-10-CM

## 2015-06-05 ENCOUNTER — Inpatient Hospital Stay
Admission: RE | Admit: 2015-06-05 | Discharge: 2015-06-05 | Disposition: A | Discharge status: Home / Self Care | Payer: Medicare Other | Source: Ambulatory Visit | Attending: Pulmonary Disease | Admitting: Pulmonary Disease

## 2015-06-05 DIAGNOSIS — J449 Chronic obstructive pulmonary disease, unspecified: Secondary | ICD-10-CM | POA: Insufficient documentation

## 2015-06-05 NOTE — Addendum Note (Signed)
Encounter addended by: Alda Ponder D, RT on: 06/05/2015  6:59 PM<BR>     Documentation filed: Inpatient Patient Education, Inpatient Document Flowsheet

## 2016-02-14 ENCOUNTER — Ambulatory Visit (INDEPENDENT_AMBULATORY_CARE_PROVIDER_SITE_OTHER): Payer: Self-pay | Admitting: Cardiovascular Disease

## 2016-02-14 ENCOUNTER — Other Ambulatory Visit: Payer: Self-pay | Admitting: Cardiovascular Disease

## 2016-02-14 DIAGNOSIS — I493 Ventricular premature depolarization: Secondary | ICD-10-CM

## 2016-02-14 DIAGNOSIS — I429 Cardiomyopathy, unspecified: Secondary | ICD-10-CM

## 2016-02-14 DIAGNOSIS — I6523 Occlusion and stenosis of bilateral carotid arteries: Secondary | ICD-10-CM

## 2016-02-14 DIAGNOSIS — R0602 Shortness of breath: Secondary | ICD-10-CM

## 2016-02-14 DIAGNOSIS — I270 Primary pulmonary hypertension: Secondary | ICD-10-CM

## 2016-02-27 ENCOUNTER — Other Ambulatory Visit (INDEPENDENT_AMBULATORY_CARE_PROVIDER_SITE_OTHER): Payer: Self-pay

## 2016-02-27 ENCOUNTER — Ambulatory Visit
Admission: RE | Admit: 2016-02-27 | Discharge: 2016-02-27 | Disposition: A | Discharge status: Home / Self Care | Payer: Medicare Other | Source: Ambulatory Visit | Attending: Cardiovascular Disease | Admitting: Cardiovascular Disease

## 2016-02-27 DIAGNOSIS — I493 Ventricular premature depolarization: Secondary | ICD-10-CM | POA: Insufficient documentation

## 2016-02-27 DIAGNOSIS — R0602 Shortness of breath: Secondary | ICD-10-CM

## 2016-02-27 DIAGNOSIS — I270 Primary pulmonary hypertension: Secondary | ICD-10-CM | POA: Insufficient documentation

## 2016-02-27 DIAGNOSIS — I429 Cardiomyopathy, unspecified: Secondary | ICD-10-CM | POA: Insufficient documentation

## 2016-02-27 DIAGNOSIS — I6523 Occlusion and stenosis of bilateral carotid arteries: Secondary | ICD-10-CM

## 2016-03-19 ENCOUNTER — Other Ambulatory Visit: Payer: Self-pay | Admitting: Pulmonary Disease

## 2017-02-08 ENCOUNTER — Other Ambulatory Visit: Payer: Self-pay | Admitting: Cardiovascular Disease

## 2017-02-08 DIAGNOSIS — I6523 Occlusion and stenosis of bilateral carotid arteries: Secondary | ICD-10-CM

## 2017-02-10 ENCOUNTER — Ambulatory Visit (INDEPENDENT_AMBULATORY_CARE_PROVIDER_SITE_OTHER): Payer: Self-pay

## 2017-02-11 ENCOUNTER — Ambulatory Visit
Admission: RE | Admit: 2017-02-11 | Discharge: 2017-02-11 | Disposition: A | Discharge status: Home / Self Care | Payer: Medicare Other | Source: Ambulatory Visit | Attending: Cardiovascular Disease | Admitting: Cardiovascular Disease

## 2017-02-11 DIAGNOSIS — I6523 Occlusion and stenosis of bilateral carotid arteries: Secondary | ICD-10-CM

## 2017-02-11 DIAGNOSIS — I10 Essential (primary) hypertension: Secondary | ICD-10-CM | POA: Insufficient documentation

## 2017-03-01 LAB — VAHRT HISTORIC LVEF: Ejection Fraction: 55 %

## 2017-03-02 ENCOUNTER — Ambulatory Visit (INDEPENDENT_AMBULATORY_CARE_PROVIDER_SITE_OTHER): Payer: Self-pay | Admitting: Cardiovascular Disease

## 2017-05-13 ENCOUNTER — Other Ambulatory Visit: Payer: Self-pay | Admitting: Cardiovascular Disease

## 2017-05-13 DIAGNOSIS — I425 Other restrictive cardiomyopathy: Secondary | ICD-10-CM

## 2017-05-21 ENCOUNTER — Ambulatory Visit: Payer: Medicare Other

## 2017-05-28 ENCOUNTER — Ambulatory Visit
Admission: RE | Admit: 2017-05-28 | Discharge: 2017-05-28 | Disposition: A | Discharge status: Home / Self Care | Payer: Medicare Other | Source: Ambulatory Visit | Attending: Cardiovascular Disease | Admitting: Cardiovascular Disease

## 2017-05-28 ENCOUNTER — Other Ambulatory Visit (INDEPENDENT_AMBULATORY_CARE_PROVIDER_SITE_OTHER): Payer: Self-pay

## 2017-05-28 ENCOUNTER — Ambulatory Visit (INDEPENDENT_AMBULATORY_CARE_PROVIDER_SITE_OTHER): Payer: Self-pay

## 2017-05-28 DIAGNOSIS — R0602 Shortness of breath: Secondary | ICD-10-CM | POA: Insufficient documentation

## 2017-05-28 DIAGNOSIS — I270 Primary pulmonary hypertension: Secondary | ICD-10-CM | POA: Insufficient documentation

## 2017-05-28 DIAGNOSIS — I425 Other restrictive cardiomyopathy: Secondary | ICD-10-CM | POA: Insufficient documentation

## 2017-05-28 DIAGNOSIS — I517 Cardiomegaly: Secondary | ICD-10-CM | POA: Insufficient documentation

## 2017-08-10 ENCOUNTER — Ambulatory Visit (INDEPENDENT_AMBULATORY_CARE_PROVIDER_SITE_OTHER): Payer: Self-pay | Admitting: Cardiovascular Disease

## 2017-08-10 DIAGNOSIS — I420 Dilated cardiomyopathy: Secondary | ICD-10-CM | POA: Insufficient documentation

## 2017-08-10 LAB — VAHRT HISTORIC LVEF: Ejection Fraction: 55 %

## 2017-08-11 DIAGNOSIS — R0600 Dyspnea, unspecified: Secondary | ICD-10-CM | POA: Insufficient documentation

## 2018-02-07 ENCOUNTER — Ambulatory Visit (INDEPENDENT_AMBULATORY_CARE_PROVIDER_SITE_OTHER): Payer: Medicare Other | Admitting: Urology

## 2018-02-07 ENCOUNTER — Encounter (INDEPENDENT_AMBULATORY_CARE_PROVIDER_SITE_OTHER): Payer: Self-pay | Admitting: Urology

## 2018-02-07 ENCOUNTER — Other Ambulatory Visit: Payer: Self-pay | Admitting: Cardiovascular Disease

## 2018-02-07 ENCOUNTER — Ambulatory Visit: Payer: Self-pay

## 2018-02-07 DIAGNOSIS — R152 Fecal urgency: Secondary | ICD-10-CM | POA: Insufficient documentation

## 2018-02-07 DIAGNOSIS — R3129 Other microscopic hematuria: Secondary | ICD-10-CM | POA: Insufficient documentation

## 2018-02-07 DIAGNOSIS — I6523 Occlusion and stenosis of bilateral carotid arteries: Secondary | ICD-10-CM

## 2018-02-07 LAB — URINALYSIS POC
POCT Urine Bilirubin: NEGATIVE
POCT Urine Glucose: NEGATIVE mg/dL
POCT Urine Ketones: NEGATIVE mg/dL
POCT Urine Nitrites: NEGATIVE
POCT Urine Urobilibogen: 0.2 mg/dL (ref 0.0–1.0)
POCT Urine pH: 6 (ref 5.0–8.0)
Protein, UR POCT: NEGATIVE mg/dL
Urine Specific Gravity POC: <=1.005 (ref 1.001–1.035)

## 2018-02-07 LAB — MEDICAL CYTOLOGY

## 2018-02-07 NOTE — Patient Instructions (Signed)
1. Renal ultrasound  2. Urine for analysis   3. Cystoscopy with next visit    --------------  I appreciate the opportunity to help take care of you and your family. Here is some basic housekeeping:      1) The FASTEST way to receive results and communicate with our office is via the portal, MyChart.  Please make sure you sign up and have an active account.    2) Please be aware that I often send results/next steps via MyChart. Please check your account regularly.  Occasionally notes that I send do not trigger email warnings, if you have not heard from me regarding pending results please check MyChart prior to calling the office.    3) There are some results that take up to 10 business days to be received and/or processed. If you have not heard from me regarding results in 7-10 business days please feel free to call [phone]) or send a message via MyChart.     4) Messages left in the general voice mailbox are not always able to be returned the same day.  If you have an urgent matter, please speak to a nurse or secretary directly    5) If you have surgical questions please call Stefanie for surgical planning and follow-up [phone].        Cystoscopy    Cystoscopy is a procedure that lets your doctor look directly inside your urethra and bladder. It can be used to:   Help diagnose a problem with your urethra, bladder, or kidneys.   Take a sample (biopsy) of bladder or urethral tissue.   Treat certain problems (such as removing kidney stones).   Place a stent to bypass an obstruction.   Take special X-rays of the kidneys.  Based on the findings, your doctor may recommend other tests or treatments.  What is a cystoscope?  A cystoscope is a telescope-like instrument that contains lenses and fiberoptics (small glass wires that make bright light). The cystoscope may be straight and rigid, or flexible to bend around curves in the urethra. The doctor may look directly into the cystoscope, or project the image  onto a monitor.  Getting ready   Ask your doctor if you should stop taking any medicines before the procedure.   Ask whether you should avoid eating or drinking anything after midnight before the procedure.   Follow any other instructions your doctor gives you.  Tell yourdoctor before the exam if you:   Take any medicines, such as aspirin or blood thinners   Have allergies to any medicines   Are pregnant   The procedure  Cystoscopy is done in the doctor's office, surgery center, or hospital. The doctor and a nurse are present during the procedure. It takes only a few minutes, longer if a biopsy, X-ray, or treatment needs to be done.  During the procedure:   You lie on an exam table on your back, knees bent and legs apart. You are covered with a drape.   Your urethra and the area around it are washed. Anesthetic jelly may be applied to numb the urethra. Other pain medicine is usually not needed. In some cases, you may be offered a mild sedative to help you relax. If a more extensive procedure is to be done, such as a biopsy or kidney stone removal, general anesthesia may be needed.   The cystoscope is inserted. A sterile fluid is put into the bladder to expand it. You may feel pressure from this  fluid.   When the procedure is done, the cystoscope is removed.  After the procedure  If you had a sedative, general anesthesia, or spinal anesthesia, you must have someone drive you home. Once you're home:   Drink plenty of fluids.   You may have burning or light bleeding when you urinate-this is normal.   Medicines may be prescribed to ease any discomfort or prevent infection. Take these as directed.   Call your doctor if you have heavy bleeding or blood clots, burning that lasts more than a day, a fever over100F (38 C), or trouble urinating.  Date Last Reviewed: 05/05/2015   2000-2019 [employer], [place]. [address] [place], [place] [address]. All rights reserved. This information is not  intended as a substitute for professional medical care. Always follow your healthcare professional's instructions.

## 2018-02-07 NOTE — Progress Notes (Signed)
Subjective:      Patient ID: Sabrina Church is a 77 y.o. female     Chief Complaint:  Pt is referred from Dr. 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     +menopausal s/p hysterectomy     Quit smoking > 15 years  No chemical or occupational exposure     No family history of bladder or kidney cancer     The following portions of the patient's history were reviewed and updated as appropriate: allergies, current medications, past family history, past medical history, past social history, past surgical history and problem list.    Review of Systems  Systems reviewed per the HPI and below:     History obtained from the patient     General ROS: Pt otherwise feeling well, no recent illness.       Ophthalmic ROS: negative for blurry vision or yellowing of the eyes     Allergy and Immunology ROS: known/unknown allergies as described by the patient     Hematological and Lymphatic ROS: No known bleeding/clotting disorders     Endocrine ROS: no significant hot/cold spells     Respiratory ROS: pt is on home O2     Cardiovascular ROS: no chest pain or dyspnea on exertion     Gastrointestinal ROS: no abdominal pain, change in bowel habits     Genito-Urinary ROS: no dysuria, trouble voiding, or gross hematuria     Musculoskeletal ROS:  no swelling to lower extremities, no back pain     Neurological ROS: no focal weakness     Dermatological ROS: no new rashes or lesions     Objective:   BP 100/65   Pulse 64   Ht 1.6 m (5\' 3" )   Wt 74.8 kg (165 lb)   BMI 29.23 kg/m    vital signs reviewed  Physical Exam   Constitutional:  Well-developed, well-nourished, and in no distress.   Cardiovascular: Normal rate.   Pulmonary/Chest: Effort normal.   Abdominal: Soft. Pt exhibits no distension and no mass. There is no tenderness. There is no rebound and no guarding.   Musculoskeletal: Normal range  of motion.   Neurological: Pt is alert and oriented to person, place, and time.   Skin: Skin is warm and dry.   Psychiatric: Mood, memory, affect and judgment normal.         Lab Review   Reviewed results as listed below. Discussed findings with patient.   Urine analysis shows trace blood, small LE   12/14/2017-RBC 6-8    Radiology Review   Reviewed results as listed below. Discussed results with the patient and answered questions to the best of my ability.   01/04/2015- ct scan- 8 cm left lower pole cyst, right atrophic kidney, mid sigmoid with thickening     Assessment:     1. Microscopic hematuria    2. Fecal urgency        - microscopic hematuria can be a sign or symptom of many urologic diseases including but not limited to renal, ureteral and bladder cancers, nephrolithiasis, infection, vascular and renal disorders.  I reviewed the risks and benefits of full work-up to help better evaluate if any of the above conditions may apply.       Plan:   Patient Instructions  1. Renal ultrasound  2. Urine for analysis   3. Cystoscopy with next visit    --------------  I appreciate the opportunity to help take care of you and your family. Here is some basic housekeeping:      1) The FASTEST way to receive results and communicate with our office is via the portal, MyChart.  Please make sure you sign up and have an active account.    2) Please be aware that I often send results/next steps via MyChart. Please check your account regularly.  Occasionally notes that I send do not trigger email warnings, if you have not heard from me regarding pending results please check MyChart prior to calling the office.    3) There are some results that take up to 10 business days to be received and/or processed. If you have not heard from me regarding results in 7-10 business days please feel free to call [phone]) or send a message via MyChart.     4) Messages left in the general voice mailbox are not always able to be returned the  same day.  If you have an urgent matter, please speak to a nurse or secretary directly    5) If you have surgical questions please call Stefanie for surgical planning and follow-up [phone].        Cystoscopy    Cystoscopy is a procedure that lets your doctor look directly inside your urethra and bladder. It can be used to:   Help diagnose a problem with your urethra, bladder, or kidneys.   Take a sample (biopsy) of bladder or urethral tissue.   Treat certain problems (such as removing kidney stones).   Place a stent to bypass an obstruction.   Take special X-rays of the kidneys.  Based on the findings, your doctor may recommend other tests or treatments.  What is a cystoscope?  A cystoscope is a telescope-like instrument that contains lenses and fiberoptics (small glass wires that make bright light). The cystoscope may be straight and rigid, or flexible to bend around curves in the urethra. The doctor may look directly into the cystoscope, or project the image onto a monitor.  Getting ready   Ask your doctor if you should stop taking any medicines before the procedure.   Ask whether you should avoid eating or drinking anything after midnight before the procedure.   Follow any other instructions your doctor gives you.  Tell yourdoctor before the exam if you:   Take any medicines, such as aspirin or blood thinners   Have allergies to any medicines   Are pregnant   The procedure  Cystoscopy is done in the doctor's office, surgery center, or hospital. The doctor and a nurse are present during the procedure. It takes only a few minutes, longer if a biopsy, X-ray, or treatment needs to be done.  During the procedure:   You lie on an exam table on your back, knees bent and legs apart. You are covered with a drape.   Your urethra and the area around it are washed. Anesthetic jelly may be applied to numb the urethra. Other pain medicine is usually not needed. In some cases, you may be offered a mild  sedative to help you relax. If a more extensive procedure is to be done, such as a biopsy or kidney stone removal, general anesthesia may be needed.   The cystoscope is inserted. A sterile fluid is put into the bladder to expand it. You may feel pressure from  this fluid.   When the procedure is done, the cystoscope is removed.  After the procedure  If you had a sedative, general anesthesia, or spinal anesthesia, you must have someone drive you home. Once you're home:   Drink plenty of fluids.   You may have burning or light bleeding when you urinate-this is normal.   Medicines may be prescribed to ease any discomfort or prevent infection. Take these as directed.   Call your doctor if you have heavy bleeding or blood clots, burning that lasts more than a day, a fever over100F (38 C), or trouble urinating.  Date Last Reviewed: 05/05/2015   2000-2019 [employer], [place]. [address], [place], [place] [address]. All rights reserved. This information is not intended as a substitute for professional medical care. Always follow your healthcare professional's instructions.                Orders  Orders Placed This Encounter   Procedures   . US Renal Kidney Bladder Complete     Standing Status:   Future     Standing Expiration Date:   08/09/2018     Order Specific Question:   Clinical info for radiologist     Answer:   microscopic hematuria   . Urinalysis POC

## 2018-02-08 LAB — LAB USE ONLY - HISTORICAL NON-GYN MEDICAL CYTOLOGY

## 2018-02-09 NOTE — Progress Notes (Signed)
Please let the pt know that urine cytology was negative. No cancer cells seen

## 2018-02-15 ENCOUNTER — Other Ambulatory Visit: Payer: Self-pay | Admitting: Urology

## 2018-02-15 ENCOUNTER — Other Ambulatory Visit: Payer: Self-pay | Admitting: Pulmonary Disease

## 2018-02-18 ENCOUNTER — Ambulatory Visit
Admission: RE | Admit: 2018-02-18 | Discharge: 2018-02-18 | Disposition: A | Discharge status: Home / Self Care | Payer: Medicare Other | Source: Ambulatory Visit | Attending: Cardiovascular Disease | Admitting: Cardiovascular Disease

## 2018-02-18 DIAGNOSIS — I6523 Occlusion and stenosis of bilateral carotid arteries: Secondary | ICD-10-CM | POA: Insufficient documentation

## 2018-02-21 ENCOUNTER — Ambulatory Visit (INDEPENDENT_AMBULATORY_CARE_PROVIDER_SITE_OTHER): Payer: Self-pay | Admitting: Cardiovascular Disease

## 2018-03-16 ENCOUNTER — Other Ambulatory Visit (INDEPENDENT_AMBULATORY_CARE_PROVIDER_SITE_OTHER): Payer: Self-pay | Admitting: Urology

## 2018-03-16 DIAGNOSIS — R3129 Other microscopic hematuria: Secondary | ICD-10-CM

## 2018-08-17 ENCOUNTER — Ambulatory Visit (INDEPENDENT_AMBULATORY_CARE_PROVIDER_SITE_OTHER): Payer: Self-pay

## 2018-08-26 ENCOUNTER — Ambulatory Visit (INDEPENDENT_AMBULATORY_CARE_PROVIDER_SITE_OTHER): Payer: Self-pay | Admitting: Cardiovascular Disease

## 2018-08-30 ENCOUNTER — Other Ambulatory Visit: Payer: Self-pay | Admitting: Cardiovascular Disease

## 2018-08-30 DIAGNOSIS — I509 Heart failure, unspecified: Secondary | ICD-10-CM

## 2018-08-30 DIAGNOSIS — I429 Cardiomyopathy, unspecified: Secondary | ICD-10-CM

## 2018-09-06 ENCOUNTER — Other Ambulatory Visit (INDEPENDENT_AMBULATORY_CARE_PROVIDER_SITE_OTHER): Payer: Self-pay | Admitting: Cardiovascular Disease

## 2018-09-09 ENCOUNTER — Ambulatory Visit
Admission: RE | Admit: 2018-09-09 | Discharge: 2018-09-09 | Disposition: A | Discharge status: Home / Self Care | Payer: Medicare Other | Source: Ambulatory Visit | Attending: Cardiovascular Disease | Admitting: Cardiovascular Disease

## 2018-09-09 ENCOUNTER — Ambulatory Visit: Payer: Medicare Other

## 2018-09-09 DIAGNOSIS — R0602 Shortness of breath: Secondary | ICD-10-CM | POA: Insufficient documentation

## 2018-09-09 DIAGNOSIS — R600 Localized edema: Secondary | ICD-10-CM | POA: Insufficient documentation

## 2018-09-09 DIAGNOSIS — I517 Cardiomegaly: Secondary | ICD-10-CM | POA: Insufficient documentation

## 2018-09-09 DIAGNOSIS — I5189 Other ill-defined heart diseases: Secondary | ICD-10-CM | POA: Insufficient documentation

## 2018-09-09 DIAGNOSIS — I429 Cardiomyopathy, unspecified: Secondary | ICD-10-CM | POA: Insufficient documentation

## 2018-09-09 DIAGNOSIS — I509 Heart failure, unspecified: Secondary | ICD-10-CM

## 2018-09-09 LAB — CBC
Hematocrit: 39.8 % (ref 35.0–45.0)
Hemoglobin: 12.8 g/dL (ref 11.7–15.5)
MCH: 28.6 pg (ref 27.0–33.0)
MCHC: 32.2 g/dL (ref 32.0–36.0)
MCV: 88.8 fL (ref 80.0–100.0)
MPV: 10 fL (ref 7.5–12.5)
Platelets: 250 10*3/uL (ref 140–400)
RBC: 4.48 10*6/uL (ref 3.80–5.10)
RDW: 13.7 % (ref 11.0–15.0)
WBC: 8.5 10*3/uL (ref 3.8–10.8)

## 2018-09-09 LAB — COMPREHENSIVE METABOLIC PANEL
ALT: 8 U/L (ref 6–29)
AST (SGOT): 17 U/L (ref 10–35)
Albumin/Globulin Ratio: 1.7 (calc) (ref 1.0–2.5)
Albumin: 4 g/dL (ref 3.6–5.1)
Alkaline Phosphatase: 89 U/L (ref 37–153)
BUN: 18 mg/dL (ref 7–25)
Bilirubin, Total: 0.6 mg/dL (ref 0.2–1.2)
CO2: 33 mmol/L — ABNORMAL HIGH (ref 20–32)
Calcium: 9.2 mg/dL (ref 8.6–10.4)
Chloride: 101 mmol/L (ref 98–110)
Creatinine: 0.73 mg/dL (ref 0.60–0.93)
EGFR African American: 91 mL/min/{1.73_m2} (ref >=60)
Globulin: 2.3 g/dL (calc) (ref 1.9–3.7)
Glucose: 91 mg/dL (ref 65–99)
NON-AFRICAN AMERICA EGFR: 78 mL/min/{1.73_m2} (ref >=60)
Potassium: 4.4 mmol/L (ref 3.5–5.3)
Protein, Total: 6.3 g/dL (ref 6.1–8.1)
Sodium: 139 mmol/L (ref 135–146)

## 2018-09-09 LAB — TSH: TSH: 2.45 mIU/L (ref 0.40–4.50)

## 2018-09-09 LAB — MAGNESIUM: Magnesium: 2.2 mg/dL (ref 1.5–2.5)

## 2018-09-09 LAB — CARDIO IQ BRAND PROBNP, N-TERM: NT-proBNP: 414 pg/mL — ABNORMAL HIGH

## 2018-09-15 ENCOUNTER — Ambulatory Visit (INDEPENDENT_AMBULATORY_CARE_PROVIDER_SITE_OTHER): Payer: Self-pay | Admitting: Cardiovascular Disease

## 2018-10-12 ENCOUNTER — Other Ambulatory Visit (INDEPENDENT_AMBULATORY_CARE_PROVIDER_SITE_OTHER): Payer: Self-pay | Admitting: Cardiovascular Disease

## 2018-10-13 LAB — COMPREHENSIVE METABOLIC PANEL
ALT: 9 U/L (ref 6–29)
AST (SGOT): 16 U/L (ref 10–35)
Albumin/Globulin Ratio: 1.5 (calc) (ref 1.0–2.5)
Albumin: 4 g/dL (ref 3.6–5.1)
Alkaline Phosphatase: 97 U/L (ref 37–153)
BUN: 17 mg/dL (ref 7–25)
Bilirubin, Total: 0.6 mg/dL (ref 0.2–1.2)
CO2: 32 mmol/L (ref 20–32)
Calcium: 9.2 mg/dL (ref 8.6–10.4)
Chloride: 101 mmol/L (ref 98–110)
Creatinine: 0.71 mg/dL (ref 0.60–0.93)
EGFR African American: 94 mL/min/{1.73_m2} (ref >=60)
Globulin: 2.7 g/dL (calc) (ref 1.9–3.7)
Glucose: 91 mg/dL (ref 65–99)
NON-AFRICAN AMERICA EGFR: 81 mL/min/{1.73_m2} (ref >=60)
Potassium: 4.6 mmol/L (ref 3.5–5.3)
Protein, Total: 6.7 g/dL (ref 6.1–8.1)
Sodium: 140 mmol/L (ref 135–146)

## 2018-10-13 LAB — CBC
Hematocrit: 41.3 % (ref 35.0–45.0)
Hemoglobin: 13.1 g/dL (ref 11.7–15.5)
MCH: 28.7 pg (ref 27.0–33.0)
MCHC: 31.7 g/dL — ABNORMAL LOW (ref 32.0–36.0)
MCV: 90.4 fL (ref 80.0–100.0)
MPV: 10.2 fL (ref 7.5–12.5)
Platelets: 216 10*3/uL (ref 140–400)
RBC: 4.57 10*6/uL (ref 3.80–5.10)
RDW: 13 % (ref 11.0–15.0)
WBC: 8.7 10*3/uL (ref 3.8–10.8)

## 2018-10-13 LAB — B-TYPE NATRIURETIC PEPTIDE: B-Natriuretic Peptide: 149 pg/mL — ABNORMAL HIGH (ref <100)

## 2018-10-13 LAB — LIPID PANEL
Cholesterol / HDL Ratio: 2.4 (calc) (ref <5.0)
Cholesterol: 127 mg/dL (ref <200)
HDL: 54 mg/dL (ref >=50)
LDL Calculated: 55 mg/dL (calc)
NON HDL CHOLESTEROL: 73 mg/dL (calc) (ref <130)
Triglycerides: 94 mg/dL (ref <150)

## 2018-10-13 LAB — TSH: TSH: 3.19 mIU/L (ref 0.40–4.50)

## 2019-02-23 ENCOUNTER — Other Ambulatory Visit: Payer: Self-pay | Admitting: Cardiovascular Disease

## 2019-02-23 DIAGNOSIS — I6523 Occlusion and stenosis of bilateral carotid arteries: Secondary | ICD-10-CM

## 2019-03-09 ENCOUNTER — Ambulatory Visit
Admission: RE | Admit: 2019-03-09 | Discharge: 2019-03-09 | Disposition: A | Discharge status: Home / Self Care | Payer: Medicare Other | Source: Ambulatory Visit | Attending: Cardiovascular Disease | Admitting: Cardiovascular Disease

## 2019-03-09 ENCOUNTER — Ambulatory Visit (INDEPENDENT_AMBULATORY_CARE_PROVIDER_SITE_OTHER): Payer: Self-pay

## 2019-03-09 DIAGNOSIS — I6523 Occlusion and stenosis of bilateral carotid arteries: Secondary | ICD-10-CM | POA: Insufficient documentation

## 2019-07-29 ENCOUNTER — Ambulatory Visit (INDEPENDENT_AMBULATORY_CARE_PROVIDER_SITE_OTHER): Payer: Medicare Other

## 2019-07-30 ENCOUNTER — Ambulatory Visit (INDEPENDENT_AMBULATORY_CARE_PROVIDER_SITE_OTHER): Payer: Medicare Other

## 2019-07-30 DIAGNOSIS — Z23 Encounter for immunization: Secondary | ICD-10-CM

## 2019-08-20 ENCOUNTER — Encounter (INDEPENDENT_AMBULATORY_CARE_PROVIDER_SITE_OTHER): Payer: Self-pay

## 2019-08-22 ENCOUNTER — Ambulatory Visit (INDEPENDENT_AMBULATORY_CARE_PROVIDER_SITE_OTHER): Payer: Medicare Other

## 2019-08-22 DIAGNOSIS — Z23 Encounter for immunization: Secondary | ICD-10-CM

## 2019-09-13 DIAGNOSIS — J8410 Pulmonary fibrosis, unspecified: Secondary | ICD-10-CM | POA: Insufficient documentation

## 2019-09-13 DIAGNOSIS — Z87891 Personal history of nicotine dependence: Secondary | ICD-10-CM | POA: Insufficient documentation

## 2019-09-13 DIAGNOSIS — R4182 Altered mental status, unspecified: Secondary | ICD-10-CM | POA: Insufficient documentation

## 2019-09-13 DIAGNOSIS — G4733 Obstructive sleep apnea (adult) (pediatric): Secondary | ICD-10-CM | POA: Insufficient documentation

## 2019-09-13 DIAGNOSIS — J4540 Moderate persistent asthma, uncomplicated: Secondary | ICD-10-CM | POA: Insufficient documentation

## 2019-09-13 HISTORY — DX: Altered mental status, unspecified: R41.82

## 2019-10-09 ENCOUNTER — Other Ambulatory Visit: Payer: Self-pay | Admitting: Pulmonary Disease

## 2019-10-24 ENCOUNTER — Telehealth (INDEPENDENT_AMBULATORY_CARE_PROVIDER_SITE_OTHER): Payer: Self-pay

## 2019-10-24 NOTE — Telephone Encounter (Signed)
Pt was transferred from scheduling. She has questions about her furosemide dosing and if she still needs to take it. She does a daily wt and has lost wt over the past few months. She was offered an appt with Dr PPS in November. She is traveling soon and is interested in talking to some one before the July 4 th holiday. Appt made for 6/30 at [facility] office.

## 2019-10-27 ENCOUNTER — Encounter (INDEPENDENT_AMBULATORY_CARE_PROVIDER_SITE_OTHER): Payer: Self-pay

## 2019-10-27 DIAGNOSIS — I472 Ventricular tachycardia, unspecified: Secondary | ICD-10-CM

## 2019-10-27 DIAGNOSIS — R600 Localized edema: Secondary | ICD-10-CM | POA: Insufficient documentation

## 2019-10-27 DIAGNOSIS — I425 Other restrictive cardiomyopathy: Secondary | ICD-10-CM | POA: Insufficient documentation

## 2019-10-27 DIAGNOSIS — I272 Pulmonary hypertension, unspecified: Secondary | ICD-10-CM

## 2019-10-27 DIAGNOSIS — I270 Primary pulmonary hypertension: Secondary | ICD-10-CM | POA: Insufficient documentation

## 2019-10-27 DIAGNOSIS — I4940 Unspecified premature depolarization: Secondary | ICD-10-CM | POA: Insufficient documentation

## 2019-10-27 DIAGNOSIS — I429 Cardiomyopathy, unspecified: Secondary | ICD-10-CM | POA: Insufficient documentation

## 2019-10-27 DIAGNOSIS — I6523 Occlusion and stenosis of bilateral carotid arteries: Secondary | ICD-10-CM | POA: Insufficient documentation

## 2019-10-27 DIAGNOSIS — I471 Supraventricular tachycardia: Secondary | ICD-10-CM

## 2019-10-27 DIAGNOSIS — Z01810 Encounter for preprocedural cardiovascular examination: Secondary | ICD-10-CM

## 2019-10-27 DIAGNOSIS — R0602 Shortness of breath: Secondary | ICD-10-CM | POA: Insufficient documentation

## 2019-10-27 HISTORY — DX: Other restrictive cardiomyopathy: I42.5

## 2019-10-27 HISTORY — DX: Ventricular tachycardia, unspecified: I47.20

## 2019-10-27 HISTORY — DX: Primary pulmonary hypertension: I27.0

## 2019-10-27 HISTORY — DX: Localized edema: R60.0

## 2019-10-27 HISTORY — DX: Ventricular tachycardia: I47.2

## 2019-10-27 HISTORY — DX: Pulmonary hypertension, unspecified: I27.20

## 2019-11-01 ENCOUNTER — Encounter (INDEPENDENT_AMBULATORY_CARE_PROVIDER_SITE_OTHER): Payer: Self-pay | Admitting: Interventional Cardiology

## 2019-11-01 ENCOUNTER — Ambulatory Visit (INDEPENDENT_AMBULATORY_CARE_PROVIDER_SITE_OTHER): Payer: Medicare Other | Admitting: Physician Assistant

## 2019-11-01 ENCOUNTER — Ambulatory Visit (INDEPENDENT_AMBULATORY_CARE_PROVIDER_SITE_OTHER): Payer: Medicare Other | Admitting: Interventional Cardiology

## 2019-11-01 VITALS — BP 116/70 | HR 80 | Ht 63.0 in | Wt 135.0 lb

## 2019-11-01 DIAGNOSIS — I270 Primary pulmonary hypertension: Secondary | ICD-10-CM

## 2019-11-01 DIAGNOSIS — R0602 Shortness of breath: Secondary | ICD-10-CM

## 2019-11-01 DIAGNOSIS — I4940 Unspecified premature depolarization: Secondary | ICD-10-CM

## 2019-11-01 DIAGNOSIS — I10 Essential (primary) hypertension: Secondary | ICD-10-CM

## 2019-11-01 NOTE — Progress Notes (Signed)
[facility] PROGRESS NOTE    HRT [facility] OFFICE      [facility] OFFICE -CARDIOLOGY  [address]  [place] [address]  Dept: [phone]  Dept Fax: [phone]       Patient Name: Sabrina Church    Date of Visit:  November 01, 2019  Date of Birth: 15-Mar-1941  AGE: 79 y.o.  Medical Record #: [id number]  Requesting Physician: Hazle Coca, MD      CHIEF COMPLAINT:   Chief Complaint   Patient presents with    Cardiomyopathy     f/u appt    Shortness of Breath        HISTORY OF PRESENT ILLNESS:    She is a pleasant 79 y.o. female who presents today for follow-up of her shortness of breath.  The patient is planned to take a trip along with family to [place] for her granddaughter's 64th birthday.  She came in for a quick check prior to proceeding with the trip.  She has had no angina.  Has chronic exertional dyspnea related to her COPD and underlying lung disease.  She also has chronic diastolic heart failure.  She been maintaining on Lasix 20 mg alternating with 40 mg.  Weight has been stable.  Has had no orthopnea or PND.      PAST MEDICAL HISTORY: She has a past medical history of Arrhythmia-PSVT, Arrhythmia-PVCs, Asthma, Asthma, Broken heart syndrome (2013), Cardiomyopathy, Carotid artery disease, COPD (chronic obstructive pulmonary disease), COPD (chronic obstructive pulmonary disease), Dyspnea on exertion, Echocardiogram (02/2012;11/2012;08/01/13;02/2016;05/2017), Holter Monitor (03/2012;02/2017), Hypertension, Irregular heart beat, Myocardial perfusion scan (02/12/2012), Pulmonary fibrosis, Pulmonary HTN, and Seasonal allergic rhinitis. She has a past surgical history that includes Hysterectomy; proaspe; Tubal ligation (Bilateral); and Cardiac catheterization (Decemeber 2013).    ALLERGIES:   Allergies   Allergen Reactions    Ceclor [Cefaclor] Hives and Rash       MEDICATIONS:   Current Outpatient Medications:     aspirin EC 81 MG EC tablet, Take 81 mg by  mouth daily., Disp: , Rfl:     carvedilol (COREG) 25 MG tablet, Take 25 mg by mouth 2 (two) times daily , Disp: , Rfl:     fluticasone (FLOVENT DISKUS) 50 MCG/BLIST diskus inhaler, Inhale 250 mcg into the lungs daily., Disp: , Rfl:     furosemide (LASIX) 20 MG tablet, Take 40 mg by mouth every other day  Alt. With 20 mg every other day , Disp: , Rfl:     levalbuterol (XOPENEX HFA) 45 MCG/ACT inhaler, Inhale 1-2 puffs into the lungs every 4 (four) hours as needed., Disp: , Rfl:     lisinopril (PRINIVIL,ZESTRIL) 2.5 MG tablet, Take 2.5 mg by mouth daily., Disp: , Rfl:     magnesium oxide (MAG-OX) 400 MG tablet, Take 400 mg by mouth daily., Disp: , Rfl:     tiotropium (SPIRIVA) 18 MCG inhalation capsule, Place 18 mcg into inhaler and inhale 2 (two) times daily , Disp: , Rfl:       FAMILY HISTORY: family history includes COPD in her mother; Cardiomyopathy in her brother; Diabetes type II in her mother; Heart disease in her father, mother, and sister; Heart failure in her mother; Myocardial Infarction in her father.    SOCIAL HISTORY: She reports that she quit smoking about 20 years ago. She has a 60.00 pack-year smoking history. She has never used smokeless tobacco. She reports that she does not drink alcohol and does not use  drugs.    PHYSICAL EXAMINATION    Visit Vitals  BP 116/70 (BP Site: Right arm, Patient Position: Sitting, Cuff Size: Medium)   Pulse 80   Ht 1.6 m (5\' 3" )   Wt 61.2 kg (135 lb)   BMI 23.91 kg/m       General Appearance:  A well-appearing, cooperative, in no acute distress.    Skin: Warm and dry to touch  Head: Normocephalic  Eyes: Conjunctivae and lids unremarkable.    ENT: wearing mask   Neck: No JVD  Chest: Clear to auscultation bilaterally with good air movement and respiratory effort and no wheezes, rales, or rhonchi diminished breath sounds throughout  Cardiovascular: Regular rhythm, S1 normal, S2 normal, No S3 or S4  Abdomen: Soft, nontender with normoactive bowel sounds.    Extremities: Warm without edema, clubbing, or cyanosis, +2 b/l radial pulses  Neuro: Alert and oriented x3. No gross motor or sensory deficits noted, affect appropriate.      ECG: Not done today    LABS:   No results found for: CBC  Lab Results   Component Value Date    AST 44 (H) 02/08/2012    ALT 49 02/08/2012     No results found for: LIPID  Lab Results   Component Value Date    HGBA1C 6.1 (H) 02/09/2012    BNP 1,141 (H) 02/14/2012    TSH 3.527 04/01/2010           IMPRESSION:   Ms. Keetch is a 79 y.o. female with the following problems:    1.         Chronic heart failure preserved EF with normalized EF on last echocardiogram May 28, 2017, euvolemic on current Lasix dosing.  Patient has been monitoring weights and has been stable.  2.         Prior PVC related cardiomyopathy with frequent PVCs noted at our April follow-up and more infrequent on today's exam  3.         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         Moderate bilateral carotid atherosclerosis 50 to 69%, stable on duplex performed in our office last week October 2019  5.         Intolerance to high-dose atorvastatin  6.         Angiographically normal coronaries in 2013 with no interim angina or progressive effort intolerance and no interim ischemic eval.    7. Chronic bronchitis followed by Dr. Brynda Rim      RECOMMENDATIONS:     May reduce Lasix to 20 mg daily at patient's request.  She will continue to monitor daily weights and take additional Lasix as needed   Continue carvedilol as directed   Follow-up with pulmonary as directed   Follow-up visit scheduled for November with Dr. Rogene Houston                                                     Orders Placed This Encounter   Procedures    ECG 12 lead (Normal)       No orders of the defined types were placed in this encounter.      SIGNED:    Ty Hilts, MD  This note was generated  by the Dragon speech recognition and may contain errors or omissions not intended by the user. Grammatical errors, random word insertions, deletions, pronoun errors, and incomplete sentences are occasional consequences of this technology due to software limitations. Not all errors are caught or corrected. If there are questions or concerns about the content of this note or information contained within the body of this dictation, they should be addressed directly with the author for clarification.

## 2019-11-02 ENCOUNTER — Ambulatory Visit (INDEPENDENT_AMBULATORY_CARE_PROVIDER_SITE_OTHER): Payer: Medicare Other | Admitting: Physician Assistant

## 2019-11-15 ENCOUNTER — Ambulatory Visit (INDEPENDENT_AMBULATORY_CARE_PROVIDER_SITE_OTHER): Payer: Medicare Other | Admitting: Cardiovascular Disease

## 2019-11-20 ENCOUNTER — Other Ambulatory Visit (INDEPENDENT_AMBULATORY_CARE_PROVIDER_SITE_OTHER): Payer: Self-pay | Admitting: Cardiovascular Disease

## 2019-11-20 DIAGNOSIS — I425 Other restrictive cardiomyopathy: Secondary | ICD-10-CM

## 2020-02-19 ENCOUNTER — Other Ambulatory Visit (INDEPENDENT_AMBULATORY_CARE_PROVIDER_SITE_OTHER): Payer: Self-pay | Admitting: Cardiovascular Disease

## 2020-02-19 DIAGNOSIS — I425 Other restrictive cardiomyopathy: Secondary | ICD-10-CM

## 2020-03-13 ENCOUNTER — Telehealth (INDEPENDENT_AMBULATORY_CARE_PROVIDER_SITE_OTHER): Payer: Self-pay

## 2020-03-13 ENCOUNTER — Ambulatory Visit (INDEPENDENT_AMBULATORY_CARE_PROVIDER_SITE_OTHER): Payer: Medicare Other | Admitting: Cardiovascular Disease

## 2020-03-13 ENCOUNTER — Encounter (INDEPENDENT_AMBULATORY_CARE_PROVIDER_SITE_OTHER): Payer: Self-pay

## 2020-03-13 ENCOUNTER — Encounter (INDEPENDENT_AMBULATORY_CARE_PROVIDER_SITE_OTHER): Payer: Self-pay | Admitting: Cardiovascular Disease

## 2020-03-13 VITALS — BP 134/66 | HR 64 | Ht 63.0 in | Wt 129.0 lb

## 2020-03-13 DIAGNOSIS — I493 Ventricular premature depolarization: Secondary | ICD-10-CM

## 2020-03-13 DIAGNOSIS — I428 Other cardiomyopathies: Secondary | ICD-10-CM

## 2020-03-13 DIAGNOSIS — I425 Other restrictive cardiomyopathy: Secondary | ICD-10-CM

## 2020-03-13 MED ORDER — FUROSEMIDE 20 MG PO TABS
20.0000 mg | ORAL_TABLET | Freq: Every day | ORAL | 0 refills | Status: DC
Start: 2020-03-13 — End: 2020-06-27

## 2020-03-13 NOTE — Telephone Encounter (Signed)
Pt daughter called pod asking if there are recommended vitamins that the pt can take with pre-existing condition. Pt was previously advised to stop taking multi-vitamins. Pt daughter specifically asked about zinc and B12. Will route to PPS for further advisement.

## 2020-03-13 NOTE — Progress Notes (Signed)
[place] [facility] CARDIOLOG[facility] OFFICE PROGRESS NOTE    HRT [facility] OFFICE      [place] [facility] OFFICE -CARDIOLOGY  [address]  [place] [address]  Dept: [phone]  Dept Fax: [phone]       Patient Name: Sabrina Church    Date of Visit:  March 13, 2020  Date of Birth: 02-22-1941  AGE: 79 y.o.  Medical Record #: [id number]  Requesting Physician: Hazle Coca, MD      CHIEF COMPLAINT: PVC , CM    HISTORY OF PRESENT ILLNESS:    She is a pleasant 79 y.o. female who presents today for follow up    Dr Brynda Rim retired and she established with Dr Wilma Flavin. She has had 2 bouts of bronchitis with 7d steroids and antibiotics. She has intentionally lost 6lb and notes her breathing is improved to where she rarely needs supplemental o2.  He did note she relays that carvedilol may be "interfering with" her other medications.    She does not relate any subjective palpitations but had frequent irregularity on my exam.  She denies any orthopnea PND (does sleep with 2 L nasal cannula) or lower extremity edema and as above has had if anything study at intentional weight loss.  At her follow-up in June she had asked about reducing the Lasix to daily but did not make that change.    She also relates she has had a few episodes where she felt stressed and sudden heart racing that broke when she took a cold shower or did bear down.  She has a history of PSVT as well.    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    s/p death of spouse    Cardiomyopathy     Carotid artery disease     COPD (chronic obstructive pulmonary disease)     COPD (chronic obstructive pulmonary disease)     Dyspnea on exertion     Echocardiogram 02/2012;11/2012;08/01/13;02/2016;05/2017    Holter Monitor 03/2012;02/2017    Hypertension     Irregular heart beat     Myocardial perfusion scan 02/12/2012     Pulmonary fibrosis     Pulmonary HTN     Seasonal allergic rhinitis        PAST SURGICAL HISTORY:   Past Surgical History:   Procedure Laterality Date    CARDIAC CATHETERIZATION  Decemeber Oct 20, 2011    HYSTERECTOMY      proaspe      TUBAL LIGATION Bilateral        ALLERGIES:   Allergies   Allergen Reactions    Ceclor [Cefaclor] Hives and Rash       MEDICATIONS:     Current Outpatient Medications:     aspirin EC 81 MG EC tablet, Take 81 mg by mouth daily., Disp: , Rfl:     carvedilol (COREG) 25 MG tablet, TAKE 1 TABLET TWICE A DAY  (ADDITIONAL REFILLS FOLLOWING 03/2020 APPOINTMENT), Disp: 180 tablet, Rfl: 0    furosemide (LASIX) 20 MG tablet, Take 1 tablet (20 mg total) by mouth daily With as needed increase to 40mg  if 2+ lb weight gain overnight or increased dysnpea, Disp: 180 tablet, Rfl: 0    levalbuterol (XOPENEX HFA) 45 MCG/ACT inhaler, Inhale 1-2 puffs into the lungs every 4 (four) hours as needed.,  Disp: , Rfl:     lisinopril (ZESTRIL) 2.5 MG tablet, TAKE 1 TABLET EVERY MORNING (ADDITIONAL REFILLS FOLLOWING 03/2020 APPOINTMENT), Disp: 90 tablet, Rfl: 0    magnesium oxide (MAG-OX) 400 MG tablet, Take 400 mg by mouth daily., Disp: , Rfl:     rosuvastatin (CRESTOR) 20 MG tablet, TAKE 1 TABLET AT BEDTIME (ADDITIONAL REFILLS FOLLOWING 03/2020 APPOINTMENT), Disp: 90 tablet, Rfl: 0    tiotropium (SPIRIVA) 18 MCG inhalation capsule, Place 18 mcg into inhaler and inhale 2 (two) times daily , Disp: , Rfl:     fluticasone (FLOVENT DISKUS) 50 MCG/BLIST diskus inhaler, Inhale 250 mcg into the lungs daily., Disp: , Rfl:      FAMILY HISTORY:   Family History   Problem Relation Age of Onset    Heart disease Mother     Heart failure Mother     COPD Mother     Diabetes type II Mother     Heart disease Father     Myocardial Infarction Father     Heart disease Sister     Cardiomyopathy Brother         SOCIAL HISTORY:   She reports that she quit smoking about 21 years ago. She has a 60.00 pack-year smoking  history. She has never used smokeless tobacco. She reports that she does not drink alcohol and does not use drugs.    PHYSICAL EXAMINATION  Vitals:    03/13/20 1138   BP: 134/66   BP Site: Left arm   Patient Position: Sitting   Cuff Size: Medium   Pulse: 64   Weight: 58.5 kg (129 lb)   Height: 1.6 m (5\' 3" )        General Appearance:  A well-appearing female in no acute distress.    Skin: Warm and dry to touch, no apparent skin lesions, or masses noted.  Head: Normocephalic, normal hair pattern, no masses or tenderness   Eyes:  conjunctivae and lids unremarkable.   ENT:   No pallor or cyanosis.    Neck: JVP normal, no carotid bruit, thyroid not enlarged   Chest: Clear to auscultation bilaterally with good air movement and respiratory effort and no wheezes, rales, or rhonchi   Cardiovascular: frequent irregularity, ? Trigeminal rhythm, S1 normal, S2 normal, No S3 or S4, Apical impulse not displaced, 1/6 SEM no diastolic murmurs, gallops or rubs detected   Abdomen: Soft, nontender, nondistended, with normoactive bowel sounds. No organomegaly.  No pulsatile masses, or bruits.   Extremities: Warm without edema, clubbing, or cyanosis. All peripheral pulses are full and equal.   Neuro: Alert and oriented x3. No gross motor or sensory deficits noted, affect appropriate.        ECG: Sinus rhythm with bifascicular block    LABS:   No results found for: CBC  Lab Results   Component Value Date    AST 16 10/12/2018    ALT 9 10/12/2018     Lab Results   Component Value Date    HGBA1C 6.1 (H) 02/09/2012    BNP 149 (H) 10/12/2018    TSH 3.19 10/12/2018       Lab Results   Component Value Date    CHOL 127 10/12/2018    CHOL 158 02/09/2012     Lab Results   Component Value Date    HDL 54 10/12/2018    HDL 34 (L) 02/09/2012     Lab Results   Component Value Date    LDL 55 10/12/2018  LDL 106 (H) 02/09/2012     Lab Results   Component Value Date    TRIG 94 10/12/2018    TRIG 92 02/09/2012        IMPRESSION:   Sabrina Church is a 79 y.o.  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 Wilma Flavin following Dr. Birdie Sons retirement, triggered by allergy or change in weather, not frequently requiring inhaler but twice treated with short course steroids and antibiotics in the last 3 to 6 months   Symptomatic palpitations with a history of PSVT and suggestive of the same, broken easily with vagal maneuver and with rare triggers   Asymptomatic moderate carotid atherosclerosis   Carotid duplex November 2020 bilateral moderate plaque w/ bilateral 50 to 69%, no change from 2019   Intolerance to high-dose atorvastatin   Angiographically normal coronaries in 2013 with no interim angina or progressive effort intolerance and no interim ischemic eval.        RECOMMENDATIONS:     48  holter to assess PVC burden   Repeat echocardiogram given the recurrent PVCs and history of PVC  related cardiomyopathy   If no recent basic metabolic panel magnesium and TSH, recommend follow-up with Dr. Daylene Posey for repeat lab work   Reduce Lasix to 20 mg daily and as needed increase of 3+ pound weight gain overnight   I explained that the carvedilol has generally served for PVC suppression and recovery of her cardiomyopathy and barring significant bronchospasm, are essential parts of her cardiovascular treatment and will need to be continued   Respect to her upcoming surgery, despite the increased PVCs, she has no clinical heart failure and the procedure is low risk and she can  pursue scheduling this and having cataract surgery at low risk.  It does not have to wait on completion of the above studies   Would regularly follow moderate carotid stenosis with a repeat study at 1 year but her preference is to minimize studies and we can readdress this at her 90-month follow-up so long as she remains on aspirin high-dose statin and has tightly controlled lipids.  We will request copy of her last lab work   Continue vagal maneuvers for recurrent bouts of PSVT and, as above, ensure up-to-date lab work including thyroid.    She tells  me she is considering splitting her time between [place] and the [place] where her children are and I told her to please be in contact with [place] both to help her gather her medical record and to make recommendations for a cardiologist to follow her more locally.  I hope to see her again in 6 months if she remains here and sooner if the above studies are abnormal or her symptoms progressive.  I have so enjoyed taking care of her these many years and will miss her greatly but wish her the best.              SIGNED:    Brett Fairy, MD          This note was generated by the Dragon speech recognition and may contain errors or omissions not intended by the user. Grammatical errors, random word insertions, deletions, pronoun errors, and incomplete sentences are occasional  consequences of this technology due to software limitations. Not all errors are caught or corrected. If there are questions or concerns about the content of this note or information contained within the body of this dictation, they should be addressed directly with the author for clarification.

## 2020-03-13 NOTE — Progress Notes (Signed)
Pt registered on [employer]. Device [id number] applied in office. Dr Fidel Levy ordered 3 day monitor for PVCs.

## 2020-03-13 NOTE — Telephone Encounter (Signed)
Pls call that Mg supplementation 400mg  daily may be particularly helpful in PVC suppression  Otherwise discuss MVI w PMD for recs but no problem with zinc and b12  Thanks!

## 2020-03-14 ENCOUNTER — Ambulatory Visit (INDEPENDENT_AMBULATORY_CARE_PROVIDER_SITE_OTHER): Payer: Medicare Other

## 2020-03-14 DIAGNOSIS — I493 Ventricular premature depolarization: Secondary | ICD-10-CM

## 2020-03-14 NOTE — Progress Notes (Signed)
Pt already registered on [employer]. Device applied in office.

## 2020-03-14 NOTE — Telephone Encounter (Signed)
Called pt to relay PPD advise regarding Vitamins. Pt had further questions regarding vit A, B,C. As per PSS advised pt to seek advise from PCP.

## 2020-03-25 ENCOUNTER — Encounter (INDEPENDENT_AMBULATORY_CARE_PROVIDER_SITE_OTHER): Payer: Self-pay

## 2020-03-25 ENCOUNTER — Encounter (INDEPENDENT_AMBULATORY_CARE_PROVIDER_SITE_OTHER): Payer: Medicare Other | Admitting: Cardiovascular Disease

## 2020-03-25 ENCOUNTER — Other Ambulatory Visit (INDEPENDENT_AMBULATORY_CARE_PROVIDER_SITE_OTHER): Payer: Self-pay

## 2020-03-25 DIAGNOSIS — I4940 Unspecified premature depolarization: Secondary | ICD-10-CM

## 2020-03-25 DIAGNOSIS — I493 Ventricular premature depolarization: Secondary | ICD-10-CM

## 2020-03-25 DIAGNOSIS — R002 Palpitations: Secondary | ICD-10-CM

## 2020-03-25 NOTE — Progress Notes (Signed)
CARDIAC AMBULATORY MONITOR REPORT    HRT [facility] OFFICE      [facility] HEART [facility] OFFICE -CARDIOLOGY  [address]  [place] [address]  Dept: [phone]  Dept Fax: [phone]    NAME:  Sabrina Church   SEX: female   DOB:  [birth year] (79 y.o.)  MRN:  [id number]          STUDY DATE:Mar 23, 2020  INTERPRETATION DATE: 03/25/2020     REFERRING PHYSICIAN:         Brett Fairy, MD      INDICATION:  PVCs, prior PVC_CM  MEDICATIONS:  Current Outpatient Medications   Medication Sig    aspirin EC 81 MG EC tablet Take 81 mg by mouth daily.    carvedilol (COREG) 25 MG tablet TAKE 1 TABLET TWICE A DAY  (ADDITIONAL REFILLS FOLLOWING 03/2020 APPOINTMENT)    fluticasone (FLOVENT DISKUS) 50 MCG/BLIST diskus inhaler Inhale 250 mcg into the lungs daily.    furosemide (LASIX) 20 MG tablet Take 1 tablet (20 mg total) by mouth daily With as needed increase to 40mg  if 2+ lb weight gain overnight or increased dysnpea    levalbuterol (XOPENEX HFA) 45 MCG/ACT inhaler Inhale 1-2 puffs into the lungs every 4 (four) hours as needed.    lisinopril (ZESTRIL) 2.5 MG tablet TAKE 1 TABLET EVERY MORNING (ADDITIONAL REFILLS FOLLOWING 03/2020 APPOINTMENT)    magnesium oxide (MAG-OX) 400 MG tablet Take 400 mg by mouth daily.    rosuvastatin (CRESTOR) 20 MG tablet TAKE 1 TABLET AT BEDTIME (ADDITIONAL REFILLS FOLLOWING 03/2020 APPOINTMENT)    tiotropium (SPIRIVA) 18 MCG inhalation capsule Place 18 mcg into inhaler and inhale 2 (two) times daily          AMBULATORY MONITOR DETAILS:    Ambulatory monitor type: Zio XT Continuous Ambulatory Monitoring  Enrollment period:   2 days 23 hrs      SUMMARY OF CLINICAL FINDINGS    Heart Rate   Min HR 47 bpm   Ave HR 65 bpm   Max HR 214 bpm   Pauses > 3.0 sec 0   Longest Pause - seconds     Ventricular Ectopy Supraventricular Ectopy   Total VE Beats 9605 beat(s) Total SVE beats 3708 beat(s)   PVC 4.2 % PAC  1.6 %   Vent Runs 5 events Atrial runs 6 events   Longest 16  beats Longest 38 secs   Fastest 210 bpm Fastest 214 bpm     AF Burden 0%     Predominant underlying rhythm: Sinus rhythm.    Arrhythmias: Occasional PVCs (4.2%) with 5 runs of nonsustained ventricular tachycardia, longest 16 beats and a max rate of 210 bpm.  Periods of bigeminy and trigeminy (longest 11 seconds and 4 minutes 20 seconds respectively).    Occasional PACs and 6 runs of SVT the longest 38 seconds with an average rate of 120 bpm.  No atrial fibrillation    Patient symptoms reported: None.    CONCLUSION:  Occasional PVCs (4.2%) with rare bigeminy and trigeminy.  5 runs of nonsustained ventricular tachycardia, longest 16 beats  Occasional PACs  6 runs of SVT the longest 38 seconds  No atrial fibrillation    Results and recommendations discussed with the patient.  Recommend up-to-date echocardiogram and repeat lab work to include basic metabolic panel magnesium and TSH.  Will likely uptitrate her carvedilol pending review of her blood work, unless there is a reversible cause.  The tracings for the above mentioned monitor can be requested by calling [facility] at [phone], ext. [phone].      Interpreted by : Brett Fairy, MD

## 2020-03-25 NOTE — Progress Notes (Signed)
11/22: Received ZIO preliminary report, shows episode of v.tach. Dr Fidel Levy, DOD and ordering provider reviewed and gave the following recommendations: CMP, CBC, Mag, TSH, ECHO (pt already has scheduled), continue Coreg. Patient states asymptomatic. Verbalized understanding and agreeable to plan.

## 2020-04-03 LAB — COMPREHENSIVE METABOLIC PANEL
ALT: 10 U/L (ref 6–29)
AST (SGOT): 19 U/L (ref 10–35)
Albumin/Globulin Ratio: 1.4 (calc) (ref 1.0–2.5)
Albumin: 3.8 g/dL (ref 3.6–5.1)
Alkaline Phosphatase: 92 U/L (ref 37–153)
BUN: 17 mg/dL (ref 7–25)
Bilirubin, Total: 0.6 mg/dL (ref 0.2–1.2)
CO2: 31 mmol/L (ref 20–32)
Calcium: 9.1 mg/dL (ref 8.6–10.4)
Chloride: 100 mmol/L (ref 98–110)
Creatinine: 0.62 mg/dL (ref 0.60–0.88)
EGFR African American: 99 mL/min/{1.73_m2} (ref >=60)
Globulin: 2.8 g/dL (calc) (ref 1.9–3.7)
Glucose: 106 mg/dL — ABNORMAL HIGH (ref 65–99)
NON-AFRICAN AMERICA EGFR: 85 mL/min/{1.73_m2} (ref >=60)
Potassium: 4.6 mmol/L (ref 3.5–5.3)
Protein, Total: 6.6 g/dL (ref 6.1–8.1)
Sodium: 138 mmol/L (ref 135–146)

## 2020-04-03 LAB — MAGNESIUM: Magnesium: 2.2 mg/dL (ref 1.5–2.5)

## 2020-04-03 LAB — TSH: TSH: 3.39 mIU/L (ref 0.40–4.50)

## 2020-04-15 ENCOUNTER — Encounter (INDEPENDENT_AMBULATORY_CARE_PROVIDER_SITE_OTHER): Payer: Self-pay

## 2020-04-15 NOTE — Progress Notes (Signed)
Unread myChart message -  Labs look good! Thyroid, kidney and electrolytes- --     Happy belated Thanksgiving to you and upcoming holidays -   Written by Brett Fairy, MD on 04/03/2020 8:06 AM EST    12/13: Verbalized understanding of lab results.

## 2020-04-23 ENCOUNTER — Ambulatory Visit
Admission: RE | Admit: 2020-04-23 | Discharge: 2020-04-23 | Disposition: A | Discharge status: Home / Self Care | Payer: Medicare Other | Source: Ambulatory Visit | Attending: Cardiovascular Disease | Admitting: Cardiovascular Disease

## 2020-04-23 DIAGNOSIS — I425 Other restrictive cardiomyopathy: Secondary | ICD-10-CM | POA: Insufficient documentation

## 2020-04-23 DIAGNOSIS — I493 Ventricular premature depolarization: Secondary | ICD-10-CM | POA: Insufficient documentation

## 2020-04-23 DIAGNOSIS — I5189 Other ill-defined heart diseases: Secondary | ICD-10-CM | POA: Insufficient documentation

## 2020-04-23 DIAGNOSIS — I428 Other cardiomyopathies: Secondary | ICD-10-CM | POA: Insufficient documentation

## 2020-04-23 DIAGNOSIS — I340 Nonrheumatic mitral (valve) insufficiency: Secondary | ICD-10-CM | POA: Insufficient documentation

## 2020-04-23 DIAGNOSIS — I517 Cardiomegaly: Secondary | ICD-10-CM | POA: Insufficient documentation

## 2020-04-23 DIAGNOSIS — I272 Pulmonary hypertension, unspecified: Secondary | ICD-10-CM | POA: Insufficient documentation

## 2020-05-10 ENCOUNTER — Encounter (INDEPENDENT_AMBULATORY_CARE_PROVIDER_SITE_OTHER): Payer: Self-pay

## 2020-05-10 NOTE — Progress Notes (Signed)
Pt called. Her optometrist is faxing over a clearance form to be signed and returned.

## 2020-05-15 ENCOUNTER — Emergency Department: Payer: Medicare Other

## 2020-05-15 ENCOUNTER — Observation Stay
Admission: EM | Admit: 2020-05-15 | Discharge: 2020-05-16 | Disposition: A | Discharge status: Home / Self Care | Payer: Medicare Other | Attending: Hospitalist | Admitting: Hospitalist

## 2020-05-15 DIAGNOSIS — M6282 Rhabdomyolysis: Secondary | ICD-10-CM | POA: Insufficient documentation

## 2020-05-15 DIAGNOSIS — J449 Chronic obstructive pulmonary disease, unspecified: Secondary | ICD-10-CM | POA: Insufficient documentation

## 2020-05-15 DIAGNOSIS — I10 Essential (primary) hypertension: Secondary | ICD-10-CM | POA: Insufficient documentation

## 2020-05-15 DIAGNOSIS — Z8679 Personal history of other diseases of the circulatory system: Secondary | ICD-10-CM | POA: Insufficient documentation

## 2020-05-15 DIAGNOSIS — Z7982 Long term (current) use of aspirin: Secondary | ICD-10-CM | POA: Insufficient documentation

## 2020-05-15 DIAGNOSIS — R748 Abnormal levels of other serum enzymes: Secondary | ICD-10-CM | POA: Insufficient documentation

## 2020-05-15 DIAGNOSIS — Z20822 Contact with and (suspected) exposure to covid-19: Secondary | ICD-10-CM | POA: Insufficient documentation

## 2020-05-15 DIAGNOSIS — R079 Chest pain, unspecified: Secondary | ICD-10-CM

## 2020-05-15 DIAGNOSIS — D72829 Elevated white blood cell count, unspecified: Secondary | ICD-10-CM | POA: Insufficient documentation

## 2020-05-15 DIAGNOSIS — R531 Weakness: Secondary | ICD-10-CM

## 2020-05-15 DIAGNOSIS — R918 Other nonspecific abnormal finding of lung field: Secondary | ICD-10-CM | POA: Insufficient documentation

## 2020-05-15 DIAGNOSIS — I471 Supraventricular tachycardia: Secondary | ICD-10-CM | POA: Insufficient documentation

## 2020-05-15 DIAGNOSIS — D509 Iron deficiency anemia, unspecified: Secondary | ICD-10-CM | POA: Insufficient documentation

## 2020-05-15 DIAGNOSIS — E538 Deficiency of other specified B group vitamins: Secondary | ICD-10-CM | POA: Insufficient documentation

## 2020-05-15 DIAGNOSIS — E785 Hyperlipidemia, unspecified: Secondary | ICD-10-CM | POA: Insufficient documentation

## 2020-05-15 DIAGNOSIS — G4733 Obstructive sleep apnea (adult) (pediatric): Secondary | ICD-10-CM | POA: Insufficient documentation

## 2020-05-15 DIAGNOSIS — R944 Abnormal results of kidney function studies: Secondary | ICD-10-CM | POA: Insufficient documentation

## 2020-05-15 DIAGNOSIS — Z87891 Personal history of nicotine dependence: Secondary | ICD-10-CM | POA: Insufficient documentation

## 2020-05-15 DIAGNOSIS — I272 Pulmonary hypertension, unspecified: Secondary | ICD-10-CM | POA: Insufficient documentation

## 2020-05-15 HISTORY — DX: Takotsubo syndrome: I51.81

## 2020-05-15 LAB — CBC AND DIFFERENTIAL
Absolute NRBC: 0 10*3/uL (ref 0.00–0.00)
Basophils Absolute Automated: 0.04 10*3/uL (ref 0.00–0.08)
Basophils Automated: 0.2 %
Eosinophils Absolute Automated: 0.09 10*3/uL (ref 0.00–0.44)
Eosinophils Automated: 0.5 %
Hematocrit: 36.2 % (ref 34.7–43.7)
Hgb: 11.3 g/dL — ABNORMAL LOW (ref 11.4–14.8)
Immature Granulocytes Absolute: 0.31 10*3/uL — ABNORMAL HIGH (ref 0.00–0.07)
Immature Granulocytes: 1.6 %
Lymphocytes Absolute Automated: 1.86 10*3/uL (ref 0.42–3.22)
Lymphocytes Automated: 9.7 %
MCH: 28.8 pg (ref 25.1–33.5)
MCHC: 31.2 g/dL — ABNORMAL LOW (ref 31.5–35.8)
MCV: 92.1 fL (ref 78.0–96.0)
MPV: 10.4 fL (ref 8.9–12.5)
Monocytes Absolute Automated: 1.37 10*3/uL — ABNORMAL HIGH (ref 0.21–0.85)
Monocytes: 7.2 %
Neutrophils Absolute: 15.42 10*3/uL — ABNORMAL HIGH (ref 1.10–6.33)
Neutrophils: 80.8 %
Nucleated RBC: 0 /100 WBC (ref 0.0–0.0)
Platelets: 189 10*3/uL (ref 142–346)
RBC: 3.93 10*6/uL (ref 3.90–5.10)
RDW: 14 % (ref 11–15)
WBC: 19.09 10*3/uL — ABNORMAL HIGH (ref 3.10–9.50)

## 2020-05-15 LAB — COMPREHENSIVE METABOLIC PANEL
ALT: 13 U/L (ref 0–55)
AST (SGOT): 34 U/L (ref 5–34)
Albumin/Globulin Ratio: 1 (ref 0.9–2.2)
Albumin: 2.9 g/dL — ABNORMAL LOW (ref 3.5–5.0)
Alkaline Phosphatase: 98 U/L (ref 37–106)
Anion Gap: 14 (ref 5.0–15.0)
BUN: 32 mg/dL — ABNORMAL HIGH (ref 7.0–19.0)
Bilirubin, Total: 0.8 mg/dL (ref 0.2–1.2)
CO2: 23 mEq/L (ref 22–29)
Calcium: 8.6 mg/dL (ref 7.9–10.2)
Chloride: 96 mEq/L — ABNORMAL LOW (ref 100–111)
Creatinine: 0.8 mg/dL (ref 0.6–1.0)
Globulin: 3 g/dL (ref 2.0–3.6)
Glucose: 89 mg/dL (ref 70–100)
Potassium: 4 mEq/L (ref 3.5–5.1)
Protein, Total: 5.9 g/dL — ABNORMAL LOW (ref 6.0–8.3)
Sodium: 133 mEq/L — ABNORMAL LOW (ref 136–145)

## 2020-05-15 LAB — CK: Creatine Kinase (CK): 1255 U/L — ABNORMAL HIGH (ref 29–168)

## 2020-05-15 LAB — B-TYPE NATRIURETIC PEPTIDE: B-Natriuretic Peptide: 175 pg/mL — ABNORMAL HIGH (ref 0–100)

## 2020-05-15 LAB — URINALYSIS REFLEX TO MICROSCOPIC EXAM - REFLEX TO CULTURE
Bilirubin, UA: NEGATIVE
Glucose, UA: NEGATIVE
Ketones UA: 10
Nitrite, UA: NEGATIVE
Protein, UR: 30 — AB
Specific Gravity UA: 1.022 (ref 1.001–1.035)
Urine pH: 5 (ref 5.0–8.0)
Urobilinogen, UA: 2 mg/dL (ref 0.2–2.0)

## 2020-05-15 LAB — TROPONIN I: Troponin I: 0.02 ng/mL (ref 0.00–0.05)

## 2020-05-15 LAB — LACTIC ACID, PLASMA: Lactic Acid: 1.3 mmol/L (ref 0.2–2.0)

## 2020-05-15 LAB — GLUCOSE WHOLE BLOOD - POCT: Whole Blood Glucose POCT: 64 mg/dL — ABNORMAL LOW (ref 70–100)

## 2020-05-15 LAB — GFR: EGFR: >60

## 2020-05-15 MED ORDER — LEVOFLOXACIN IN D5W 750 MG/150ML IV SOLN
750.0000 mg | Freq: Once | INTRAVENOUS | Status: AC
Start: 2020-05-15 — End: 2020-05-15
  Administered 2020-05-15: 21:00:00 750 mg via INTRAVENOUS
  Filled 2020-05-15: qty 150

## 2020-05-15 MED ORDER — DEXTROSE 50 % IV SOLN
12.5000 g | INTRAVENOUS | Status: DC | PRN
Start: 2020-05-15 — End: 2020-05-17

## 2020-05-15 MED ORDER — HEPARIN SODIUM (PORCINE) 5000 UNIT/ML IJ SOLN
5000.0000 [IU] | Freq: Two times a day (BID) | INTRAMUSCULAR | Status: DC
Start: 2020-05-15 — End: 2020-05-17
  Administered 2020-05-15 – 2020-05-16 (×2): 5000 [IU] via SUBCUTANEOUS
  Filled 2020-05-15 (×2): qty 1

## 2020-05-15 MED ORDER — FLUTICASONE FUROATE 200 MCG/ACT IN AEPB
1.0000 | INHALATION_SPRAY | Freq: Every morning | RESPIRATORY_TRACT | Status: DC
Start: 2020-05-16 — End: 2020-05-17
  Administered 2020-05-16: 09:00:00 1 via RESPIRATORY_TRACT
  Filled 2020-05-15 (×2): qty 14

## 2020-05-15 MED ORDER — GLUCOSE 40 % PO GEL
15.0000 g | ORAL | Status: DC | PRN
Start: 2020-05-15 — End: 2020-05-17

## 2020-05-15 MED ORDER — SODIUM CHLORIDE 0.9 % IV BOLUS
1000.0000 mL | Freq: Once | INTRAVENOUS | Status: AC
Start: 2020-05-15 — End: 2020-05-15
  Administered 2020-05-15: 19:00:00 1000 mL via INTRAVENOUS

## 2020-05-15 MED ORDER — ASPIRIN 81 MG PO TBEC
81.0000 mg | DELAYED_RELEASE_TABLET | Freq: Every day | ORAL | Status: DC
Start: 2020-05-16 — End: 2020-05-17
  Administered 2020-05-16: 09:00:00 81 mg via ORAL
  Filled 2020-05-15: qty 1

## 2020-05-15 MED ORDER — TIOTROPIUM BROMIDE MONOHYDRATE 18 MCG IN CAPS
18.0000 ug | ORAL_CAPSULE | Freq: Every morning | RESPIRATORY_TRACT | Status: DC
Start: 2020-05-16 — End: 2020-05-16
  Filled 2020-05-15: qty 5

## 2020-05-15 MED ORDER — SODIUM CHLORIDE 0.9 % IV BOLUS
1000.0000 mL | Freq: Once | INTRAVENOUS | Status: AC
Start: 2020-05-15 — End: 2020-05-15
  Administered 2020-05-15: 21:00:00 1000 mL via INTRAVENOUS

## 2020-05-15 MED ORDER — MAGNESIUM OXIDE 400 MG TABS (WRAP)
400.0000 mg | ORAL_TABLET | Freq: Every day | ORAL | Status: DC
Start: 2020-05-16 — End: 2020-05-17
  Administered 2020-05-16: 09:00:00 400 mg via ORAL
  Filled 2020-05-15: qty 1

## 2020-05-15 MED ORDER — ROSUVASTATIN CALCIUM 20 MG PO TABS
20.0000 mg | ORAL_TABLET | Freq: Every evening | ORAL | Status: DC
Start: 2020-05-15 — End: 2020-05-15

## 2020-05-15 MED ORDER — NALOXONE HCL 0.4 MG/ML IJ SOLN (WRAP)
0.2000 mg | INTRAMUSCULAR | Status: DC | PRN
Start: 2020-05-15 — End: 2020-05-17

## 2020-05-15 MED ORDER — MELATONIN 3 MG PO TABS
3.0000 mg | ORAL_TABLET | Freq: Every evening | ORAL | Status: DC | PRN
Start: 2020-05-15 — End: 2020-05-17

## 2020-05-15 MED ORDER — GLUCAGON 1 MG IJ SOLR (WRAP)
1.0000 mg | INTRAMUSCULAR | Status: DC | PRN
Start: 2020-05-15 — End: 2020-05-17

## 2020-05-15 NOTE — ED Notes (Signed)
[facility] EMERGENCY DEPT  ED NURSING NOTE FOR THE RECEIVING INPATIENT NURSE   ED NURSE Hennie Duos [phone]   ED CHARGE RN Corrie Dandy   ADMISSION INFORMATION   Sabrina Church is a 80 y.o. female admitted with an ED diagnosis of:    1. Rhabdomyolysis         Isolation: None   Allergies: Ceclor [cefaclor] and Amoxicillin   Holding Orders confirmed? N/A   Belongings Documented? Yes   Home medications sent to pharmacy confirmed? N/A   NURSING CARE   Patient Comes From:   Mental Status: Home Independent  alert and oriented   ADL: Independent with all ADLs   Ambulation: mild difficulty   Pertinent Information  and Safety Concerns: Presented to ED with generalized weakness, found on floor by son after a fall. Unable to get up/been there since Tues morning. A&Ox4. Given abx for UTI and 2L of fluid. VS stable.    CT / NIH   CT Head ordered on this patient?  Yes   NIH/Dysphagia assessment done prior to admission? N/A   VITAL SIGNS (at the time of this note)      Vitals:    05/15/20 2115   BP: 108/54   Pulse: 69   Resp: 21   Temp:    SpO2: 95%

## 2020-05-15 NOTE — H&P (Signed)
ADMISSION HISTORY AND PHYSICAL EXAM    Date Time: 05/15/20 11:33 PM  Patient Name: Sabrina Church  Attending Physician: Olin Pia, DO  Primary Care Physician: Hazle Coca, MD    CC: LE weakness       Assessment:   80 year old female with COPD, paroxysmal SVT, G2 DD, hypertension, mild pulmonary hypertension, OSA who presents with lower extremity weakness after being found down.  Notes taking rosuvastatin at home labs notable for mildly elevated CK, mildly elevated BUN, chest x-ray with an unclear infiltrate unconvincing of pneumonia, EKG with no ischemic changes.  Overall, concerning for mild rhabdomyolysis in the setting of possible dehydration from recent diuretic changes versus statin induced myopathy (though she has been taking statin for many years) versus nutritional deficiencies    Remains hemodynamically stable with no further weakness or pain.    Plan:   #Lower extremity weakness in setting of mild rhabdomyolysis  DDx: Statin myopathy versus dehydration from recent diuretic increase.  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  Versus nutritional deficiencies (unclear  - Hold diuretics (takes Lasix 20 mg, 40 mg alternatively at home)  - S/p 2 L normal saline bolus in the ED  - Follow-up CK in a.m.  - Hold statin in case of myopathy  -Follow-up orthostatic blood pressure readings (though patient is already received 2 L normal saline bolus in the ED)  - Repeat troponins are negative x2 (unlikely to be ischemic in nature)  - Follow-up TSH  - Follow-up thiamine, B12, folate  -- F/u covid    - Follow-up orthostatics (though patient is already received 2 L of   #History of hyperlipidemia  - Hold statin as above due to concern for myopathy    #Hypertension  - Hold carvedilol 25 twice daily given borderline blood pressures, recent weakness, restart in a.m.    #Paroxysmal SVT  - Hold carvedilol as above, restart if pressures hold steady    #COPD -not in exacerbation  -- on 2 L   - Continue to  tiotropium, Flovent inhalers.    #Leukocytosis - likely reactionary   F/u cultures, monitor off abx. CXR likely indicative of edema, atelectasis   -- s/p levaquin x1 in ED.     #Mild pulmonary hypertension  #G2 DD  #History of Takotsubo cardiomyopathy in 2013    #CODE STATUS  Patient has an advanced directive on file.  Notes that she would not want CPR or chest compressions, which she reiterated to me bedside.  She however was unsure about being placed on ventilator.  I I discussed with her the risks and indications for being on a ventilator.  I specifically mentioned that given her age and functionality there is a small chance she may not be able to be extubated should she be intubated, however given her otherwise benign comorbidities, she will likely tolerate intubation and be extubated at the present time.  She is still pondering ventilation, however I discussed with her that for now we will leave her as no CPR with ventilation allowed outside of cardiac arrest.  Should her abuse change, she will inform her primary team in the morning.    #Medication reconciliation  - Reviewed all medications with patient's and ordered them accordingly.      Disposition: (Please see PAF column for Expected D/C Date)   Today's date: 05/15/2020   Admit Date: 05/15/2020  5:48 PM  Service status: Inpatient: risk of morbidity and mortality    History of Presenting Illness:     80 year old  female with history of COPD, paroxysmal SVT, grade 3 diastolic dysfunction, hypertension, mild pulmonary hypertension, OSA, who presents to the ED with lower extremity weakness after being found down.    Patient lives by herself, and was cleaning up Christmas decorations on the floor on Monday when she suddenly was unable to get up.  She notes that her right leg was flexible, but her left leg was severely heavy, and she was unable to bend it.  She stayed on the floor and was crawling around her home, until her son arrived the next morning, about 24  hours later.  She denies any falls, loss of consciousness, lightheadedness, dizziness, recent changes in gait, lower extremity weakness.  At baseline, she has a normal gait and walks without a walker.  About 2 weeks ago, she notes that her Lasix regimen was increased from 20 p.o. daily to 20 p.o. alternating with 40 mg p.o. next day.  She checks her weight at home, notes no changes.  She consumes upwards of 8 glasses of water a day roughly greater than 2 L, and endorses intact appetite.  Otherwise denies any fevers, chills, recent sick contacts, recent travel.    She follows with  Heart, and was recently seen by Dr. Shellee [facility].  At the time she was noted to have paroxysmal SVT with longest episode lasting about 38 seconds at a time.  Echocardiogram revealed G2 DD, mild pulmonary hypertension.  She was continued on carvedilol 25 twice daily, and Lasix regimen as mentioned previously.  She notes adherence to all of her medications.    In the ED, she is noted to be normotensive, afebrile, with heart rates ranging from 60s-80s.  Labs notable for CK around 1000, creatinine at baseline.  She received 2 L of normal saline bolus.  EKG revealed normal sinus rhythm with pre-existing right bundle branch block, left axis deviation, previously seen, borderline left ventricular hypertrophy.  UA with mild leukoesterase, mild proteinuria (previously seen), no nitrites, WBCs.  Chest x-ray revealed And asymmetric left greater than right infiltrates concerning for edema versus pneumonia, for which she received Levaquin x1.    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    Pulmonary fibrosis     Pulmonary HTN     Seasonal allergic rhinitis     Stress-induced cardiomyopathy     Ventricular tachycardia 10/27/2019       Available old records reviewed, including:  [place] summaries     Past Surgical History:     Past Surgical History:   Procedure Laterality Date    CARDIAC CATHETERIZATION  Decemeber October 16, 2011    HYSTERECTOMY      proaspe      TUBAL LIGATION Bilateral        Family History:     Family History   Problem Relation Age of Onset    Heart  disease Mother     Heart failure Mother     COPD Mother     Diabetes type II Mother     Heart disease Father     Myocardial Infarction Father     Heart disease Sister     Cardiomyopathy Brother          Social History:     Social History     Tobacco Use   Smoking Status Former Smoker    Packs/day: 1.50    Years: 40.00    Pack years: 60.00    Quit date: 12/09/1998    Years since quitting: 21.4   Smokeless Tobacco Never Used     Social History     Substance and Sexual Activity   Alcohol Use No     Social History     Substance and Sexual Activity   Drug Use No       Allergies:     Allergies   Allergen Reactions    Ceclor [Cefaclor] Hives and Rash    Amoxicillin        Medications:     Home Medications             aspirin EC 81 MG EC tablet     Take 81 mg by mouth daily.     carvedilol (COREG) 25 MG tablet     TAKE 1 TABLET TWICE A DAY  (ADDITIONAL REFILLS FOLLOWING 03/2020 APPOINTMENT)     fluticasone (FLOVENT DISKUS) 50 MCG/BLIST diskus inhaler     Inhale 250 mcg into the lungs daily.     furosemide (LASIX) 20 MG tablet     Take 1 tablet (20 mg total) by mouth daily With as needed increase to 40mg  if 2+ lb weight gain overnight or increased dysnpea     levalbuterol (XOPENEX HFA) 45 MCG/ACT inhaler     Inhale 1-2 puffs into the  lungs every 4 (four) hours as needed.     lisinopril (ZESTRIL) 2.5 MG tablet     TAKE 1 TABLET EVERY MORNING (ADDITIONAL REFILLS FOLLOWING 03/2020 APPOINTMENT)     magnesium oxide (MAG-OX) 400 MG tablet     Take 400 mg by mouth daily.     rosuvastatin (CRESTOR) 20 MG tablet     TAKE 1 TABLET AT BEDTIME (ADDITIONAL REFILLS FOLLOWING 03/2020 APPOINTMENT)     tiotropium (SPIRIVA) 18 MCG inhalation capsule     Place 18 mcg into inhaler and inhale 2 (two) times daily              Method by which medications were confirmed on admission: patient     Review of Systems:   All other systems were reviewed and are negative except: As noted in HPI    Physical Exam:     Patient Vitals for the past 24 hrs:   BP Temp Temp src Pulse Resp SpO2 Height Weight   05/15/20 2120 (!) 116/91 97.6 F (36.4 C) Oral 76 20 99 % 1.6 m (5\' 3" ) 63.3 kg (139 lb 8 oz)   05/15/20 2115 108/54 -- -- 69 21 95 % -- --   05/15/20 2100 107/47 -- -- 66 21 92 % -- --   05/15/20 2015 110/53 -- -- 70 -- 94 % -- --   05/15/20 2001 120/56 98.1 F (36.7 C) Oral 71 19 93 % -- --   05/15/20 1945 115/58 -- -- 75 -- 93 % -- --   05/15/20 1930 114/56 -- -- 71 19 94 % -- --  05/15/20 1902 105/51 -- -- 63 (!) 23 92 % -- --   05/15/20 1724 112/54 -- -- 83 -- 92 % 1.619 m (5' 3.75") 60.8 kg (134 lb)   05/15/20 1716 -- -- -- 88 18 93 % 1.6 m (5\' 3" ) 58 kg (127 lb 13.9 oz)     Body mass index is 24.71 kg/m.  No intake or output data in the 24 hours ending 05/15/20 2333    General: awake, alert, oriented x 3; no acute distress.  HEENT: mucous membranes moist  Neck: supple, no lymphadenopathy, no thyromegaly, no JVD, no carotid bruits  Cardiovascular: regular rate and rhythm, no murmurs, rubs or gallops  Lungs: clear to auscultation bilaterally, without wheezing, rhonchi, or rales  Abdomen: soft, non-tender, non-distended; no palpable masses  Extremities: no clubbing, cyanosis, or edema  Neuro: cranial nerves grossly intact, strength 4/5 in upper and lower extremities,  sensation intact,   Skin: no rashes or lesions noted      Labs:     Results     Procedure Component Value Units Date/Time    Blood Culture Aerobic/Anaerobic #1 [[id number]] Collected: 05/15/20 2007    Specimen: Arm from Blood, Venipuncture Updated: 05/15/20 2148    Narrative:      1 BLUE+1 PURPLE    Blood Culture Aerobic/Anaerobic #2 [[id number]] Collected: 05/15/20 1901    Specimen: Arm from Blood, Venipuncture Updated: 05/15/20 2029    Narrative:      1 BLUE+1 PURPLE    B-type Natriuretic Peptide [[id number]]  (Abnormal) Collected: 05/15/20 1901    Specimen: Blood Updated: 05/15/20 2016     B-Natriuretic Peptide 175 pg/mL     UA Reflex to Micro - Reflex to Culture [[id number]]  (Abnormal) Collected: 05/15/20 1947     Updated: 05/15/20 2013     Urine Type Urine, Clean Ca     Color, UA Yellow     Clarity, UA Clear     Specific Gravity UA 1.022     Urine pH 5.0     Leukocyte Esterase, UA Trace     Nitrite, UA Negative     Protein, UR 30     Glucose, UA Negative     Ketones UA 10     Urobilinogen, UA 2.0 mg/dL      Bilirubin, UA Negative     Blood, UA Trace     RBC, UA 0 - 2 /hpf      WBC, UA 11 - 25 /hpf      Squamous Epithelial Cells, Urine 0 - 5 /hpf      Renal Epithel, UA 0 - 2 /hpf      Hyaline Casts, UA 26 - 50 /lpf     Narrative:      Rescheduled by [id number] at 05/15/2020 19:02 Reason: Printed by   mistake/Printing Issues.    Troponin I [[id number]] Collected: 05/15/20 1901    Specimen: Blood Updated: 05/15/20 2011     Troponin I 0.02 ng/mL     Comprehensive metabolic panel [[id number]]  (Abnormal) Collected: 05/15/20 1901    Specimen: Blood Updated: 05/15/20 2003     Glucose 89 mg/dL      BUN 53.6 mg/dL      Creatinine 0.8 mg/dL      Sodium 644 mEq/L      Potassium 4.0 mEq/L      Chloride 96 mEq/L      CO2 23 mEq/L      Calcium 8.6 mg/dL      Protein, Total  5.9 g/dL      Albumin 2.9 g/dL      AST (SGOT) 34 U/L      ALT 13 U/L      Alkaline Phosphatase 98 U/L      Bilirubin, Total 0.8 mg/dL      Globulin 3.0 g/dL       Albumin/Globulin Ratio 1.0     Anion Gap 14.0    Creatine Kinase (CK) [[id number]]  (Abnormal) Collected: 05/15/20 1901    Specimen: Blood Updated: 05/15/20 2003     Creatine Kinase (CK) 1,255 U/L     GFR [[id number]] Collected: 05/15/20 1901     Updated: 05/15/20 2003     EGFR >60.0    CBC and differential [[id number]]  (Abnormal) Collected: 05/15/20 1901    Specimen: Blood Updated: 05/15/20 1938     WBC 19.09 x10 3/uL      Hgb 11.3 g/dL      Hematocrit 44.0 %      Platelets 189 x10 3/uL      RBC 3.93 x10 6/uL      MCV 92.1 fL      MCH 28.8 pg      MCHC 31.2 g/dL      RDW 14 %      MPV 10.4 fL      Neutrophils 80.8 %      Lymphocytes Automated 9.7 %      Monocytes 7.2 %      Eosinophils Automated 0.5 %      Basophils Automated 0.2 %      Immature Granulocytes 1.6 %      Nucleated RBC 0.0 /100 WBC      Neutrophils Absolute 15.42 x10 3/uL      Lymphocytes Absolute Automated 1.86 x10 3/uL      Monocytes Absolute Automated 1.37 x10 3/uL      Eosinophils Absolute Automated 0.09 x10 3/uL      Basophils Absolute Automated 0.04 x10 3/uL      Immature Granulocytes Absolute 0.31 x10 3/uL      Absolute NRBC 0.00 x10 3/uL     Lactic Acid [[id number]] Collected: 05/15/20 1901    Specimen: Blood Updated: 05/15/20 1915     Lactic Acid 1.3 mmol/L     Procalcitonin [[id number]] Collected: 05/15/20 1901     Updated: 05/15/20 1902    Narrative:      Remove serum from gel or clot before transporting.          Imaging personally reviewed, including:cxr    Safety Checklist  DVT prophylaxis:  CHEST guideline (See page [id number]) Chemical   Foley:  [place] Rn Foley protocol Not present   IVs:  Peripheral IV   PT/OT: Ordered   Daily CBC & or Chem ordered:  SHM/ABIM guidelines (see #5) Yes, due to clinical and lab instability   Reference for approximate charges of common labs: CBC auto diff - $76   BMP - $99   Mg - $79    Signed by: Magnus Sinning, MD, MD   ZD:GLOVF, Margorie John, MD

## 2020-05-15 NOTE — Plan of Care (Signed)
Seen and examined pt at bedside  Full H&P to follow    80 yr old female with h/o COPD, cardiomyopathy presenting with generalized weakness and unable to stand up, noted to have rhabdomyolysis and possible pneumonia. Denies fall or LOC or any injury.    A/P:    Rhabdomyolysis- cr WNL. Continue IVF, monitor CK closely  PT/OT eval    Possible pneumonia- empiric levaquin, follow cultures    COPD- on 2 L oxygen at nights at home.    Signed by Phylliss Blakes, MD

## 2020-05-15 NOTE — ED Provider Notes (Signed)
CC: Generalized weakness and Dehydration  .         HPI:   Sabrina Church is a 80 y.o. female presents with generalized weakness. Was home on Monday and putting xmas decorations away. Felt weak and couldn't get off the floor. Spent the next 24 h lying on the ground, crawling around occasionally, but being unable to stand up. Found by son on ground on Tuesday night. Has been walking around weakly after. No pain in back or legs. No n/v/d. No f/c. Has a cough but reports it is chronic.      ROS: The following systems were reviewed and negative unless otherwise noted:  Constitutional, Skin, HENT, Eyes, Cardiovascular, Respiratory, GI, GU, Musculoskeletal, Neuro, and Psych.     Home Medications             aspirin EC 81 MG EC tablet     Take 81 mg by mouth daily.     carvedilol (COREG) 25 MG tablet     TAKE 1 TABLET TWICE A DAY  (ADDITIONAL REFILLS FOLLOWING 03/2020 APPOINTMENT)     fluticasone (FLOVENT DISKUS) 50 MCG/BLIST diskus inhaler     Inhale 250 mcg into the lungs daily.     furosemide (LASIX) 20 MG tablet     Take 1 tablet (20 mg total) by mouth daily With as needed increase to 40mg  if 2+ lb weight gain overnight or increased dysnpea     levalbuterol (XOPENEX HFA) 45 MCG/ACT inhaler     Inhale 1-2 puffs into the lungs every 4 (four) hours as needed.     lisinopril (ZESTRIL) 2.5 MG tablet     TAKE 1 TABLET EVERY MORNING (ADDITIONAL REFILLS FOLLOWING 03/2020 APPOINTMENT)     magnesium oxide (MAG-OX) 400 MG tablet     Take 400 mg by mouth daily.     rosuvastatin (CRESTOR) 20 MG tablet     TAKE 1 TABLET AT BEDTIME (ADDITIONAL REFILLS FOLLOWING 03/2020 APPOINTMENT)     tiotropium (SPIRIVA) 18 MCG inhalation capsule     Place 18 mcg into inhaler and inhale 2 (two) times daily           She is allergic to ceclor [cefaclor] and amoxicillin.    Past Medical History: Pulm HTN, COPD, cardiomyopathy    Social History:  She reports that she quit smoking about 21 years ago. She has a 60.00 pack-year smoking history. She has  never used smokeless tobacco. She reports that she does not drink alcohol and does not use drugs.      Family History:   Family History   Problem Relation Age of Onset    Heart disease Mother     Heart failure Mother     COPD Mother     Diabetes type II Mother     Heart disease Father     Myocardial Infarction Father     Heart disease Sister     Cardiomyopathy Brother          Physical Exam:     Pulse 88   BP 112/54   Resp 18   SpO2 93 %   Temp 98.1 F (36.7 C)    Gen: No acute distress, awake/alert  HEENT: Atraumatic, normocephalic, conjunctiva anicteric, EOMI  Neck: Supple, no obvious masses  CV: Regular rate, rhythm  Resp: Nonlabored, equal chest rise  Abd: Nondistended, nontender  Extremities: wwp, no swelling, able to abd/adduct legs b/l w/ no pain in hips  Skin: No lesions, no rashes  Neuro:  Awake, alert, moving all extremities     ED Medications Given:     ED Medication Orders (From admission, onward)    Start Ordered     Status Ordering Provider    05/15/20 2039 05/15/20 2038  sodium chloride 0.9 % bolus 1,000 mL  Once        Route: Intravenous  Ordered Dose: 1,000 mL     Last [facility] action: New Bag Dellis Anes    05/15/20 2038 05/15/20 2038  levoFLOXacin (LEVAQUIN) 750mg  in D5W IVPB (premix)  Once        Route: Intravenous  Ordered Dose: 750 mg     Last [facility] action: New Bag Burt Knack M    05/15/20 1732 05/15/20 1731  sodium chloride 0.9 % bolus 1,000 mL  Once        Route: Intravenous  Ordered Dose: 1,000 mL     Last [facility] action: Stopped SALAMEH, KARIM J          EKG: 82 bpm, NSR, no st elev, RBBB, LAFB, qtc 479    MDM:   79y woman p/w generalized weakness and was ground for over 24 h. No focal signs of trauma on exam. CXR w/ opacity in L lung - unclear cause. WBC 19 and possible PNA. Levaquin given and blood cx obtained. CPK obtained and over 1000. Will hydrate aggressively in order to prevent renal failure. BUN/Cr increased and c/f renal injury. Fluids ordered. Will be admitted  for close observation, fluids, and monitoring of resp status.     Critical Care  Performed by: Dellis Anes, MD  Authorized by: Dellis Anes, MD     Critical care provider statement:     Critical care time (minutes):  32    Critical care was necessary to treat or prevent imminent or life-threatening deterioration of the following conditions:  Metabolic crisis    Critical care was time spent personally by me on the following activities:  Blood draw for specimens, development of treatment plan with patient or surrogate, discussions with primary provider, ordering and review of radiographic studies, pulse oximetry, ordering and review of laboratory studies and ordering and performing treatments and interventions                 Diagnosis:   Final diagnoses:   Rhabdomyolysis       Disposition: Admission      Burt Knack MD                 Dellis Anes, MD  05/15/20 2125

## 2020-05-15 NOTE — ED Notes (Signed)
I have briefly evaluated this patient as triage physician in order to facilitate and initiate the ordering of laboratory and imaging studies as needed.       Latanya Presser, MD  05/15/20 [phone]

## 2020-05-15 NOTE — ED Triage Notes (Signed)
Patient arrives to ED for c/o generalized weakness. Was at home Monday night putting away [employer]. Unable to get up off of floor and was found on the floor Tuesday morning by son. AOx4 at this time, remains on room air.

## 2020-05-16 ENCOUNTER — Observation Stay: Payer: Medicare Other

## 2020-05-16 DIAGNOSIS — D509 Iron deficiency anemia, unspecified: Secondary | ICD-10-CM | POA: Insufficient documentation

## 2020-05-16 DIAGNOSIS — I272 Pulmonary hypertension, unspecified: Secondary | ICD-10-CM

## 2020-05-16 DIAGNOSIS — I10 Essential (primary) hypertension: Secondary | ICD-10-CM

## 2020-05-16 DIAGNOSIS — J449 Chronic obstructive pulmonary disease, unspecified: Secondary | ICD-10-CM

## 2020-05-16 DIAGNOSIS — I471 Supraventricular tachycardia: Secondary | ICD-10-CM

## 2020-05-16 DIAGNOSIS — E785 Hyperlipidemia, unspecified: Secondary | ICD-10-CM

## 2020-05-16 LAB — BASIC METABOLIC PANEL
Anion Gap: 11 (ref 5.0–15.0)
BUN: 21 mg/dL — ABNORMAL HIGH (ref 7.0–19.0)
CO2: 21 mEq/L — ABNORMAL LOW (ref 22–29)
Calcium: 7.9 mg/dL (ref 7.9–10.2)
Chloride: 103 mEq/L (ref 100–111)
Creatinine: 0.6 mg/dL (ref 0.6–1.0)
Glucose: 85 mg/dL (ref 70–100)
Potassium: 3.8 mEq/L (ref 3.5–5.1)
Sodium: 135 mEq/L — ABNORMAL LOW (ref 136–145)

## 2020-05-16 LAB — CBC
Absolute NRBC: 0 10*3/uL (ref 0.00–0.00)
Hematocrit: 32.9 % — ABNORMAL LOW (ref 34.7–43.7)
Hgb: 10.1 g/dL — ABNORMAL LOW (ref 11.4–14.8)
MCH: 28.8 pg (ref 25.1–33.5)
MCHC: 30.7 g/dL — ABNORMAL LOW (ref 31.5–35.8)
MCV: 93.7 fL (ref 78.0–96.0)
MPV: 10.2 fL (ref 8.9–12.5)
Nucleated RBC: 0 /100 WBC (ref 0.0–0.0)
Platelets: 166 10*3/uL (ref 142–346)
RBC: 3.51 10*6/uL — ABNORMAL LOW (ref 3.90–5.10)
RDW: 14 % (ref 11–15)
WBC: 14.22 10*3/uL — ABNORMAL HIGH (ref 3.10–9.50)

## 2020-05-16 LAB — IRON PROFILE
Iron Saturation: 10 % — ABNORMAL LOW (ref 15–50)
Iron: 21 ug/dL — ABNORMAL LOW (ref 40–145)
TIBC: 216 ug/dL — ABNORMAL LOW (ref 265–497)
UIBC: 195 ug/dL (ref 126–382)

## 2020-05-16 LAB — GFR: EGFR: >60

## 2020-05-16 LAB — ECG 12-LEAD
Atrial Rate: 82 {beats}/min
P Axis: 71 degrees
P-R Interval: 146 ms
Q-T Interval: 410 ms
QRS Duration: 134 ms
QTC Calculation (Bezet): 479 ms
R Axis: -56 degrees
T Axis: 70 degrees
Ventricular Rate: 82 {beats}/min

## 2020-05-16 LAB — HEMOLYSIS INDEX: Hemolysis Index: 6 (ref 0–24)

## 2020-05-16 LAB — VITAMIN B12: Vitamin B-12: 189 pg/mL — ABNORMAL LOW (ref 211–911)

## 2020-05-16 LAB — FOLATE: Folate: 5.2 ng/mL

## 2020-05-16 LAB — PROCALCITONIN: Procalcitonin: 2.61 — ABNORMAL HIGH (ref 0.00–0.10)

## 2020-05-16 LAB — COVID-19 (SARS-COV-2): SARS CoV 2 Overall Result: NEGATIVE

## 2020-05-16 LAB — MAGNESIUM: Magnesium: 2.1 mg/dL (ref 1.6–2.6)

## 2020-05-16 LAB — GLUCOSE WHOLE BLOOD - POCT
Whole Blood Glucose POCT: 83 mg/dL (ref 70–100)
Whole Blood Glucose POCT: 108 mg/dL — ABNORMAL HIGH (ref 70–100)
Whole Blood Glucose POCT: 138 mg/dL — ABNORMAL HIGH (ref 70–100)

## 2020-05-16 LAB — CK: Creatine Kinase (CK): 734 U/L — ABNORMAL HIGH (ref 29–168)

## 2020-05-16 LAB — FERRITIN: Ferritin: 305.8 ng/mL — ABNORMAL HIGH (ref 4.60–204.00)

## 2020-05-16 LAB — TSH: TSH: 2.23 u[IU]/mL (ref 0.35–4.94)

## 2020-05-16 MED ORDER — ROSUVASTATIN CALCIUM 20 MG PO TABS
20.0000 mg | ORAL_TABLET | Freq: Every evening | ORAL | Status: DC
Start: 2020-05-16 — End: 2020-05-17

## 2020-05-16 MED ORDER — IRON SUCROSE 20 MG/ML IV SOLN
200.0000 mg | INTRAVENOUS | Status: DC
Start: 2020-05-16 — End: 2020-05-17
  Administered 2020-05-16: 11:00:00 200 mg via INTRAVENOUS
  Filled 2020-05-16: qty 10

## 2020-05-16 MED ORDER — TIOTROPIUM BROMIDE MONOHYDRATE 2.5 MCG/ACT IN AERS
2.0000 | INHALATION_SPRAY | Freq: Every morning | RESPIRATORY_TRACT | Status: DC
Start: 2020-05-16 — End: 2020-05-17
  Administered 2020-05-16: 09:00:00 2 via RESPIRATORY_TRACT
  Filled 2020-05-16 (×2): qty 1

## 2020-05-16 MED ORDER — CARVEDILOL 25 MG PO TABS
25.0000 mg | ORAL_TABLET | Freq: Two times a day (BID) | ORAL | Status: DC
Start: 2020-05-16 — End: 2020-05-17
  Administered 2020-05-16: 11:00:00 25 mg via ORAL
  Filled 2020-05-16: qty 1

## 2020-05-16 MED ORDER — CYANOCOBALAMIN 500 MCG PO TABS
500.0000 ug | ORAL_TABLET | Freq: Every day | ORAL | 0 refills | Status: AC
Start: 2020-05-17 — End: 2020-06-16

## 2020-05-16 MED ORDER — POTASSIUM CHLORIDE CRYS ER 20 MEQ PO TBCR
20.0000 meq | EXTENDED_RELEASE_TABLET | Freq: Once | ORAL | Status: AC
Start: 2020-05-16 — End: 2020-05-16
  Administered 2020-05-16: 09:00:00 20 meq via ORAL
  Filled 2020-05-16: qty 1

## 2020-05-16 MED ORDER — FERROUS SULFATE 324 (65 FE) MG PO TBEC
324.0000 mg | DELAYED_RELEASE_TABLET | Freq: Every morning | ORAL | 0 refills | Status: AC
Start: 2020-05-16 — End: 2020-06-15

## 2020-05-16 MED ORDER — LEVOFLOXACIN 750 MG PO TABS
750.0000 mg | ORAL_TABLET | Freq: Every day | ORAL | Status: DC
Start: 2020-05-16 — End: 2020-05-17
  Administered 2020-05-16: 09:00:00 750 mg via ORAL
  Filled 2020-05-16 (×3): qty 1

## 2020-05-16 MED ORDER — VITAMIN B-12 500 MCG PO TABS
500.0000 ug | ORAL_TABLET | Freq: Every day | ORAL | Status: DC
Start: 2020-05-16 — End: 2020-05-17
  Administered 2020-05-16: 11:00:00 500 ug via ORAL
  Filled 2020-05-16: qty 1

## 2020-05-16 NOTE — Discharge Instr - Other Orders (Signed)
OK FOR HER TO GO HOME WITH A HOSPITAL GOWN. THANKS

## 2020-05-16 NOTE — OT Eval Note (Signed)
[facility]   Occupational Therapy Evaluation     Patient: Sabrina Church    MRN#: [id number]   Unit: [facility] ORIG BLDG 5 WEST  Bed: FO585/FO585.01                                     Post Acute Care Therapy Recommendations:   Discharge Recommendations: Home with supervision,Home with home health OT     DME Recommended for Discharge: Tub transfer bench,Front wheel walker    Therapy discharge recommendations may change with patient status.  Please refer to most recent note for up-to-date recommendations.    Assessment:   Significant Findings: none    Sabrina Church is a 80 y.o. female admitted 05/15/2020.  Patient presents with decreased activity tolerance and decreased strength limiting ADL performance, functional transfers, mobility, and safety. PTA patient was I with ADL's, required assistance with IADL's, and I with functional transfers and mobility w/o AD. Pt is currently grossly requiring SBA-CGA assistance for ADL's and SBA/CGA for functional mobility/transfers. Pt leads were stuck onto her gown, RN notified. Pt upset about needing O2 during activity, "I only wear this at night never during the day". PT notified OT sats dropping into the 80's on RA, now on 2L. Pt lives alone but her son then dtr are going to take turns staying with her until she is able to move to [place] to be closer to family. See ADL's below for current functional level. At this time patient is performing below baseline and would benefit from OT services while IP in order to maximize independence with ADL's, functional transfers, mobility, and safety.     Therapy Diagnosis: Decreased I with ADL     Rehabilitation Potential: Good    Treatment Activities: OT eval, ADL re-training, functional transfer training     Educated the patient to role of occupational therapy, plan of care, goals of therapy and safety with mobility and ADLs, energy conservation techniques, home safety.    Plan:   OT Frequency Recommended: 3-4x/wk      Treatment Interventions: ADL retraining;Functional transfer training;UE strengthening/ROM    Risks/benefits/POC discussed with patient        Precautions and Contraindications:   Falls    Consult received for Carlena Hurl Rutigliano for OT Evaluation and Treatment.  Patients medical condition is appropriate for Occupational Therapy intervention at this time.      History of Present Illness:    Sabrina Church is a 80 y.o. female admitted on 05/15/2020 with " Sabrina Church is a 80 y.o. female presents with generalized weakness. Was home on Monday and putting xmas decorations away. Felt weak and couldn't get off the floor. Spent the next 24 h lying on the ground, crawling around occasionally, but being unable to stand up. Found by son on ground on Tuesday night. Has been walking around weakly after. No pain in back or legs. No n/v/d. No f/c. Has a cough but reports it is chronic.    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, 2011    s/p death of spouse    Carotid artery disease     COPD (chronic obstructive pulmonary disease)  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 Recommend follow-up to ensure clearing. Prince Solian, MD  05/15/2020 6:47 PM       Social History:   Prior Level of Function: I with ADL's, required assistance with IADL's from dtr  DME/AE Currently at Home: Pt reports she is getting a RW and cane, has grab bars in the shower  Baseline Activity: Household ambulation, pt was driving previously, reports 0 falls within the last year   Home Living Arrangements: Lives alone  Home Layout: 1STE, single level home  Bathroom Layout: tub shower    Subjective:    Patient is agreeable to participation in the therapy session.     Patient Goal: To go home    Pain:   Scale: 0/10  Location: na  Intervention: repositioned to comfort, RN aware    Objective:   Patient is out of bed, ambulating with peripheral IV O2 2L telemetry in place.    Translator: #N/A  Rehab Tech: N/A  PPE Worn by Therapist: gloves, goggles, surgical mask  PPE Worn by Patient:  surgical mask yes    Cognitive Status and Neuro Exam:  Pt A&Ox4. Presents with good safety awareness.    Overall cognitive status: WNL  Arousal/Alertness: Appropriate responses to stimuli   Attention Span: appears intact  Memory: appears intact  Following Commands: 3 step commands with vcs  Insights: WNL  Impulsivity: WNL  Safety/Judgement: WNL  Behavior: pleasant, cooperative  Motor Planning: Intact      Musculoskeletal Examination  RUE ROM: WFL  LUE ROM: WFL    RUE Strength: WFL  LUE Strength: WFL    Sensory/Oculomotor Examination  Auditory:WNL  Tactile: WNL  Vision: WNL      Current Functional ADL Status  Eating:Independent   Grooming: Supervision, standing at the sink  Bathing: Contact Guard Assist , Minimal Assist   UE Dressing: Minimal Assist due to lines  LE Dressing: Stand by Assist   Toileting: Stand by Assist     Functional Mobility:  Supine to ZOX:WRUEAV to Assess   Sit to Stand: Stand by Assist   Transfers: Stand by Assist     PMP Activity:  Step 6 - Walks in Room      Balance  Static Sitting: I  Dynamic Sitting: I  Static Standing: SBA  Dynamic Standing: SBA    Participation and Activity Tolerance  Participation Effort: Good  Endurance: Good    Patient left with call bell within reach, all needs met, SCDs as found, fall mat in place, bed alarm off, discussed placement of chair alarm on patient because she had been calling when  she needed to get OOB but OT found patient trying to untangle her own wires standing upon entering the room, and all questions answered. RN notified of session outcome and patient response.       Goals:  Time For Goal Achievement: 2 visits  ADL Goals  Patient will dress lower body: Modified Independent  Pt will complete bathing: Supervision  Patient will toilet: Modified Independent  Mobility and Transfer Goals  Pt will perform functional transfers: Modified Independent                         Epifania Gore OTR/L  Pager #[phone]      Time of treatment:   OT Received On: 05/16/20  Start Time: 1250  Stop Time: 1320  Time Calculation (min): 30 min

## 2020-05-16 NOTE — UM Notes (Signed)
INPATIENT    ADMISSION 1/12:    CC: LE weakness   Assessment:   80yo  W/ COPD, paroxysmal SVT, G2 DD, HTN, mild pulm HTN, OSA   -- p/w LE weakness after being found down. Notes taking rosuvastatin at home labs notable for mildly elevated CK, mildly elevated BUN, chest x-ray w/ an unclear infiltrate unconvincing of pneumonia, EKG with no ischemic changes.  Overall, concerning for mild rhabdomyolysis in the setting of possible dehydration from recent diuretic changes vs statin induced myopathy (though she has been taking statin for many yrs) vs nutritional deficiencies  Plan:   #LE weakness in setting of mild rhabdomyolysis  DDx: Statin myopathy vs dehydration from recent diuretic increase.  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  Vs nutritional deficiencies (unclear   Hold diuretics (takes Lasix 20 mg, 40 mg alternatively at home)   S/p 2 L normal saline bolus in the ED   F/u CK in a.m.   Hold statin in case of myopathy  F/u orthostatic BP readings (though pt is already received 2 L NS bolus in the ED)   Repeat troponins are negative x2 (unlikely to be ischemic in nature)   F/u TSH   F/u thiamine, B12, folate  -- F/u covid     F/u orthostatics (though pt is already received 2 L    #H/o hyperlipidemia   Hold statin as above due to concern for myopathy  #HTN  Hold carvedilol 25 twice daily given borderline blood pressures, recent weakness, restart in a.m.  #Paroxysmal SVT  Hold carvedilol as above, restart if pressures hold steady  #COPD -not in exacerbation -- on 2 L    Cont to tiotropium, Flovent inhalers.  #Leukocytosis - likely reactionary   F/u cultures, monitor off abx. CXR likely indicative of edema, atelectasis   -- s/p levaquin x1 in ED.   #Mild pulm HTN  #G2 DD  #H/o Takotsubo cardiomyopathy in 2013  #CODE STATUS - Pt has an advanced directive on file.       CSR 1/13:     VS RANGE:   Temp:  [97.6 F (36.4 C)-98.7 F (37.1 C)] 98.7 F (37.1 C)  Heart Rate:  [63-114] 110  Resp Rate:  [18-23]  18  BP: (97-123)/(47-91) 113/56    Impression:  1. Generalized weakness, found down, very mild rhabdomyolysis  2. Leukocytosis, abnormal CXR (seems to have progressed) and elevated procalcitonin but w/o other infectious signs and symptoms. PNA vs other primary lung pathology  3. Vit B12 deficiency  4. COPD  5. H/o Takotsubo cardiomyopathy 2013, recovered LVEF  Plan:    Hold further fluids   Check CT chest   B12 supplementation, IV iron dose while remains hospitalized   Resume statin and carvedilol   PT/OT       Scheduled Meds:   aspirin EC Oral Daily    carvedilol Oral Q12H SCH    fluticasone furoate Inhalation QAM    heparin (porcine) Subcut Q12H SCH    iron sucrose Intravenous Q24H SCH    levoFLOXacin Oral Daily    magnesium oxide Oral Daily    rosuvastatin Oral QHS    tiotropium Inhalation QAM    vitamin B-12 Oral Daily   PRN Meds: melatonin, naloxone    Recent Labs     05/16/20  0402 05/15/20  1901   Sodium 135* 133*   Chloride 103 96*   CO2 21* 23   Albumin  --  2.9*   BUN 21.0* 32.0*   WBC  14.22* 19.09*   Hematocrit 32.9* 36.2   Hgb 10.1* 11.3*

## 2020-05-16 NOTE — Discharge Instr - AVS First Page (Addendum)
Reason for your Hospital Admission:  Sabrina Church, you presented to the hospital after being found down for an extended period of time due to weakness. Whenever this occurs, we check a lab Creatinine Kinase (CK) to show muscle damage. Your CK level was mildly elevated, but improved following fluid administration. One complication that can occur due elevated CK levels is kidney damage, but your kidney function is good. We believe your weakness was due to nutritional deficiencies (low iron, low B12) and dehydration given your recent diuretic change. You were also found to have an abnormal chest xray showing increased material in the left side. A CT scan of your chest was performed, to be followed up as outpatient during your pulmonologist visit. You were deemed stable for discharge as of 05/16/2020.      Instructions for after your discharge:  Please follow up with your primary care physician to discuss blood pressure and vitamin deficiencies.  Please follow up with your cardiologist to discuss your lasix dose  Please follow up with the pulmonology clinic with Dr. Ashley Royalty on Friday January 21st at 10 AM. Specifically discuss CT chest imaging results.  Regarding your medications, we are instructing that you do not take Lisinopril any longer until following up with your primary care physician and cardiologist.    Please report to the emergency department or call your primary care physician if developing signs of symptoms such as severe headaches, chest pain, difficulty breathing, loss of bowel/bladder control, or severe weakness.       Home Health Discharge Information    Your doctor has ordered Skilled Nursing, Physical Therapy and Occupational Therapy in-home service(s) for you while you recuperate at home, to assist you in the transition from hospital to home.    The agency that you or your representative chose to provide the service:  Name of Home Health Agency Placement: Other (comment box) [facility]   [phone])      [employer]:  Name of DME Agency: [facility]  Equipment Ordered: Front wheel walker to be delivered to patient at bedside pending insurance authorization.      The above services were set up by:  Darlis Loan, RN [facility] Liaison) Phone [phone]      IF YOU HAVE NOT HEARD FROM YOUR HOME YOUR HOME HEALTH AGENCY WITHIN 24-48 HOURS AFTER DISCHARGE PLEASE CALL YOUR AGENCY TO ARRANGE A TIME FOR YOUR FIRST VISIT. FOR ANY SCHEDULING CONCERNS OR QUESTIONS RELATED TO HOME HEALTH, SUCH AS TIME OR DATE PLEASE CONTACT YOUR HOME HEALTH AGENCY AT THE NUMBER LISTED ABOVE.

## 2020-05-16 NOTE — Plan of Care (Signed)
Problem: Safety  Goal: Patient will be free from injury during hospitalization  Outcome: Progressing  Goal: Patient will be free from infection during hospitalization  Outcome: Progressing     Problem: Discharge Barriers  Goal: Patient will be discharged home or other facility with appropriate resources  Outcome: Progressing     Problem: Psychosocial and Spiritual Needs  Goal: Demonstrates ability to cope with hospitalization/illness  Outcome: Progressing

## 2020-05-16 NOTE — Progress Notes (Signed)
I have seen and personally examined the patient. Discussed with the resident team, full note to follow. In brief:    Impression:  1. Generalized weakness, found down, very mild rhabdomyolysis  2. Leukocytosis, abnormal CXR (seems to have progressed) and elevated procalcitonin but without other infectious signs and symptoms. Pneumonia vs other primary lung pathology  3. Vitamin B12 deficiency  4. COPD  5. History of Takotsubo cardiomyopathy 2013, recovered LVEF    Plan:    Hold further fluids   Check CT chest   B12 supplementation, IV iron dose while remains hospitalized   Resume statin and carvedilol   PT/OT     Anselm Pancoast, MD  Pager: [phone]

## 2020-05-16 NOTE — Progress Notes (Signed)
[facility]   Phone: [phone]  Fax: [phone]    Note:    Adult rolling walker delivered at patient's hospital room.

## 2020-05-16 NOTE — Final Progress Note (DC Note for stay less than 48 (Signed)
MEDICINE FINAL PROGRESS NOTE    Date Time: 05/16/20 3:38 PM  Patient Name: Sabrina Church  Attending Physician: Anselm Pancoast, MD    Assessment:   80 year old female with COPD, paroxysmal SVT, G2 DD, hypertension, mild pulmonary hypertension, OSA who presents with lower extremity weakness after being found down.  Found to have mildly elevated CK, CXR with an unclear L infiltrate, EKG with no ischemic changes, iron/b12 deficient. Overall, concerning for mild rhabdomyolysis in the setting of weakness 2/2 possible dehydration from recent diuretic changes vs nutritional deficiencies. Currently HDS, w/ improved CK and strength levels.    Plan:   #Lower extremity weakness in setting of mild rhabdomyolysis - resolved  CK improved s/p 2L NS bolus. DDx includes over diuresis w/ recent diuretic change vs underlying pulmonary condition vs nutritional deficiencies vs less likely statin induced myopathy.  - Hold diuretics (takes Lasix 20 mg, 40 mg alternatively at home) while admitted  - EKG NSR, trop neg  - TSH, folate normal  - Iron panel w/ low iron 21 and iron sat 10. B12 low.  - further management as below  - PT/OT    #Normocytic anemia 2/2 iron deficiency anemia  #B12 deficiency  - Iron panel showing low iron 21 and low iron sat 10  - venofer x1, will discharge on PO iron  - starting B12 500 mcg QD    #Abnormal CXR w/ L sided infiltrates  CXR with L>R infiltrates. Low DRIP score. S/p levaquin x1 in ED. Procal 2.61. Used to follow w/ [facility] for COPD/Asthma. Has hx of recurrent PNA's. No current cough or dyspnea.  - COVID negative  - blood cultures pending  - CT chest non con pending  - CBC QD    #History of hyperlipidemia  - continue crestor 20 mg QHS    #Hypertension  - continue coreg 25 mg BID    #Paroxysmal SVT  - Coreg as above    #COPD - stable  - on 2 L at home  - Continue home tiotropium, Flovent inhalers    #Mild pulmonary hypertension  #G2 DD  #History of Takotsubo cardiomyopathy in 2013  BNP on admission 189    - further management with diuretics and GDMT    Disposition  Pt to be discharged home. Instructed to hold home lisinopril given soft BP's and to allow room for diuretics given hx of pHTN. No other home medications adjusted. Discharged with vitamin b12 500 mcg QD and ferrous sulfate PO given deficiencies. She has been instructed to follow w/ her PCP and Cardiologist. In addition, she has an appointment scheduled with Dr. Ashley Royalty of [facility] on 05/24/2020 at 10 AM to discuss results of her CT chest today. The software to upload images is down currently in the hospital. Given resolution of weakness, improving CK levels, baseline respiratory status, feel safe for discharge. She will have these results discussed then at follow up visit with pulmonology. Not being discharged on antibiotics given low concern for pneumonia, no dyspnea, and on home O2.    Case discussed with: Dr. Lynnea Ferrier    Safety Checklist:     DVT prophylaxis:  CHEST guideline (See page [phone]) Chemical   Foley:  [place] Rn Foley protocol Not present   IVs:  PIV   PT/OT: Ordered   Daily CBC & or Chem ordered:  SHM/ABIM guidelines (see #5) Yes, due to clinical and lab instability   Reference for approximate charges of common labs: CBC auto diff - $76   BMP - $  99   Mg - $79    Lines:     Patient Lines/Drains/Airways Status     Active PICC Line / CVC Line / PIV Line / Drain / Airway / Intraosseous Line / Epidural Line / ART Line / Line / Wound / Pressure Ulcer / NG/OG Tube     Name Placement date Placement time Site Days    Peripheral IV 05/15/20 20 G Left Hand 05/15/20  1904  Hand  less than 1                 Disposition: (Please see PAF column for Expected D/C Date)   Today's date: 05/16/2020   Admit Date: 05/15/2020  5:48 PM   LOS: 1    Clinical Milestones: IVF, improving CK, abx  Anticipated discharge needs: TBD      Subjective     CC: Rhabdomyolysis    HPI/Subjective: Since admission, states she feels great. Denies fevers, chills, chest pain, SOB, wheezing. Seen  on 2L [place], home amount.    Review of Systems:     As per HPI    Physical Exam:     VITAL SIGNS PHYSICAL EXAM   Temp:  [97.6 F (36.4 C)-98.7 F (37.1 C)] 98.7 F (37.1 C)  Heart Rate:  [63-114] 110  Resp Rate:  [18-23] 18  BP: (97-123)/(47-91) 113/56  Blood Glucose:    Telemetry:     No intake or output data in the 24 hours ending 05/16/20 1538 Physical Exam  General: awake, alert X oriented x3  Cardiovascular: regular rate and rhythm, no murmurs, rubs or gallops  Lungs: clear to auscultation bilaterally, without wheezing, rhonchi, or rales  Abdomen: soft, non-tender, non-distended; no palpable masses,  normoactive bowel sounds  Extremities: no edema  Neuro: CN2-12 grossly intact, strength 5/5 in upper/lower extremities, sensation intact       Meds:     Medications were reviewed:  Current Facility-Administered Medications   Medication Dose Route Frequency    aspirin EC  81 mg Oral Daily    carvedilol  25 mg Oral Q12H SCH    fluticasone furoate  1 puff Inhalation QAM    heparin (porcine)  5,000 Units Subcutaneous Q12H [facility]    iron sucrose  200 mg Intravenous Q24H SCH    levoFLOXacin  750 mg Oral Daily    magnesium oxide  400 mg Oral Daily    rosuvastatin  20 mg Oral QHS    tiotropium  2 puff Inhalation QAM    vitamin B-12  500 mcg Oral Daily     Current Facility-Administered Medications   Medication Dose Route Frequency Last Rate     Current Facility-Administered Medications   Medication Dose Route    dextrose  15 g of glucose Oral    And    dextrose  12.5 g Intravenous    And    glucagon (rDNA)  1 mg Intramuscular    melatonin  3 mg Oral    naloxone  0.2 mg Intravenous         Labs:     Labs (last 72 hours):    Recent Labs   Lab 05/16/20  0402 05/15/20  1901   WBC 14.22* 19.09*   Hgb 10.1* 11.3*   Hematocrit 32.9* 36.2   Platelets 166 189          Recent Labs   Lab 05/16/20  0402 05/15/20  1901   Sodium 135* 133*   Potassium 3.8 4.0   Chloride 103  96*   CO2 21* 23   BUN 21.0* 32.0*   Creatinine 0.6  0.8   Calcium 7.9 8.6   Albumin  --  2.9*   Protein, Total  --  5.9*   Bilirubin, Total  --  0.8   Alkaline Phosphatase  --  98   ALT  --  13   AST (SGOT)  --  34   Glucose 85 89                   Microbiology, reviewed and are significant for:  Microbiology Results (last 15 days)     Procedure Component Value Units Date/Time    COVID-19 (SARS-COV-2) [facility]ne [facility] Rapid) [[id number]] Collected: 05/16/20 0453    Order Status: Completed Specimen: Nasopharyngeal Swab from Nasopharynx Updated: 05/16/20 0529     Purpose of COVID testing Screening     SARS-CoV-2 Specimen Source Nasopharyngeal     SARS CoV 2 Overall Result Negative     Comment: Test performed using the Abbott ID NOW EUA assay.  Please see Fact Sheets for patients and providers located at:  http://www.rice.biz/    This test is for the qualitative detection of SARS-CoV-2  (COVID19) nucleic acid. Viral nucleic acids may persist in vivo,  independent of viability. Detection of viral nucleic acid does  not imply the presence of infectious virus, or that virus  nucleic acid is the cause of clinical symptoms. Negative  results should be treated as presumptive and, if inconsistent  with clinical signs and symptoms or necessary for patient  management, should be tested with an alternative molecular  assay. Negative results do not preclude SARS-CoV-2 infection  and should not be used as the sole basis for patient  management decisions. Invalid results may be due to inhibiting  substances in the specimen and recollection should occur.         Narrative:      o Collect and clearly label specimen type:  o Upper respiratory specimen: One Nasopharyngeal Dry Swab NO  Transport Media.  o Hand deliver to laboratory ASAP  Indication for testing->Extended care facility admission to  semi private room  Screening    COVID-19 (SARS-COV-2) ([facility] Standard test)- procedure/surgery: standard PCR [[id number]]     Order Status: Canceled Specimen: Nasopharyngeal Swab from  Nasopharynx     Blood Culture Aerobic/Anaerobic #1 [[id number]] Collected: 05/15/20 2007    Order Status: Sent Specimen: Arm from Blood, Venipuncture Updated: 05/15/20 2148    Narrative:      1 BLUE+1 PURPLE    Urine culture [[id number]] Collected: 05/15/20 1947    Order Status: No result Specimen: Bladder Urine Updated: 05/15/20 2013    Blood Culture Aerobic/Anaerobic #2 [[id number]] Collected: 05/15/20 1901    Order Status: Sent Specimen: Arm from Blood, Venipuncture Updated: 05/15/20 2029    Narrative:      1 BLUE+1 PURPLE          Imaging, reviewed and are significant for:  XR Chest  AP Portable    Result Date: 05/15/2020   Asymmetric left greater than right-sided infiltrates versus edema and perhaps effusions. Recommend follow-up to ensure clearing. Prince Solian, MD  05/15/2020 6:47 PM      Signed by: Nigel Sloop, MD  PGY-1  Internal Medicine

## 2020-05-16 NOTE — PT Eval Note (Addendum)
[facility]   Physical Therapy Evaluation     Patient: Sabrina Church    MRN#: [id number]   Unit: [facility] ORIG BLDG 5 WEST  Bed: FO585/FO585.01      Post Acute Care Therapy Recommendations:   Discharge Recommendations: Home with supervision,Home with home health PT (-safety)     Milestones to be reached to achieve recommendation: increase mobility and endurance  Anticipate achievement in 1-2 sessions    DME Recommended for Discharge:  (None for PT)    Therapy discharge recommendations may change with patient status.  Please refer to most recent note for up-to-date recommendations.    Assessment:   Significant Findings: HR goes to 134 bpm and desats to 85% on RA with mobility-denied any symptoms, RN aware.     Sabrina Church is a 80 y.o. female admitted 05/15/2020 with generalized weakness, found down for ~24hrs,very mild Rhabdomyolysis. Patient presents in chair agreeable to participate in therapy. Patient was Supervision/ SBA with transfers and mobility. Patient tripped x1 on right foot,but no LOB. Patient said, his son and then her daughter will be at home to assist on d/c. Patient presents with decreased endurance(desats on RA),High HR with activity all affecting functional mobility.  Patient would benefit from one more session to maximize functional independence with mobility and safety.        Therapy Diagnosis: Impaired functional mobility and endurance    Rehabilitation Potential: Good    Treatment Activities: PT Eval, gait training, therex-AP's<LAQ's<hip flexion, safety  Educated the patient to role of physical therapy, plan of care, goals of therapy and HEP, safety with mobility and ADLs, discharge instructions, home safety.    Plan:   PT Frequency: follow-up visit only    Treatment/Interventions: gait training, therex,safety    Risks/benefits/POC discussed with the patient       Precautions and Contraindications:   Falls  Monitor HR and O2 sats    Consult received for Sabrina Church for  PT Evaluation and Treatment.  Patients medical condition is appropriate for Physical Therapy intervention at this time.    History of Present Illness:   Sabrina Church is a 80 y.o. female admitted on 05/15/2020 per H&P,"who presents with lower extremity weakness after being found down. Notes taking rosuvastatin at home labs notable for mildly elevated CK, mildly elevated BUN, chest x-ray with an unclear infiltrate unconvincing of pneumonia, EKG with no ischemic changes. Overall, concerning for mild rhabdomyolysis in the setting of possible dehydration from recent diuretic changes versus statin induced myopathy (though she has been taking statin for many years) versus nutritional deficiencies."      Medical Diagnosis: Rhabdomyolysis [M62.82]    Past Medical/Surgical History:  COPD, paroxysmal SVT, G2 DD, hypertension, mild pulmonary hypertension, OSA   Imaging/Tests/Labs:  XR Chest AP Portable   Impression:    Asymmetric left greater than right-sided infiltrates versus   edema and perhaps effusions. Recommend follow-up to ensure clearing.       Social History:   Prior Level of Function: Independent  Assistive Devices: None  Baseline Activity: Community  DME Currently at Home: grab bars in bathroom, uses 2L O2 at night only  Home Living Arrangements: Lives alone. Son visiting from [facility] and patient plans to move there in 3 months. Daughter will be visiting from [facility] as well.   Type of Home: [facility]  Home Layout: No stairs    Subjective:"I never had fall. Its just, I couldn't stand once I was sitting on the  floor."   Patient is agreeable to participation in the therapy session. Nursing clears patient for therapy.     Patient Goal: Go home    Pain:   Denied    Objective:   Patient is in bed with tele, IV access ,NC on RA in place.   O2 sats on RA at rest 92%  O2 sats on RA with activity 85%  O2 sats on 2L at rest 96%  O2 sats on 2L with activity 96%    HR ranges from 110-134 bpm with activity    Pt wore mask during therapy  session:Yes    Cognitive Status and Neuro Exam:  Alert and Oriented x 4. Able to follow commands.     Musculoskeletal Examination  RUE ROM: WFL  LUE ROM: WFL  RLE ROM: WFL--mild soreness with knee extension due to crawling on floor for almost 24 hrs.  LLE ROM: WFL-mild soreness with knee extension due to crawling on floor for almost 24 hrs.    Grossly  RUE Strength: 5/5  LUE Strength: 5/5  RLE Strength: 4+/5  LLE Strength: 4+/5    Functional Mobility  Rolling: NT  Supine to Sit: NT. Received in chair  Scooting: Supervision  Sit to Stand: Superision  Stand to Sit: SBA  Transfers: SBA        Ambulation  PMP - Progressive Mobility Protocol   PMP Activity: Step 7 - Walks out of Room  Distance Walked (ft) (Step 6,7): 200 Feet     Level of assistance required: SBA  Pattern: small steps, decrease cadence  Device Used: none  Weightbearing Status: FWB BLE        Balance  Static Sitting: Good  Dynamic Sitting: Good  Static Standing: Good-  Dynamic Standing: Good-    Participation and Activity Tolerance  Participation Effort: Good  Endurance: Good-      Patient left with call bell within reach, all needs met, SCDs off as found, fall mat in place, chair alarm off as found  and all questions answered. RN notified of session outcome and patient response.     Goals:  Goals  Goal Formulation: With patient  Time for Goal Acheivement: 2 visits  Goals: Select goal  Pt Will Ambulate: > 200 feet,independent  Pt Will Perform Home Exer Program: independent      PPE worn during session: procedural mask, face shield and gloves    Tech present:no      Synetta Shadow  PT  Pager Number:[phone]    Time of Treatment  PT Received On: 05/16/20  Start Time: 1200  Stop Time: 1245  Time Calculation (min): 45 min

## 2020-05-16 NOTE — Progress Notes (Signed)
Home Health Referral      Referral from (Case Manager) for home health care upon discharge.    By [employer], the patient has the right to freely choose a home care provider.    Arrangements have been made with:     A company of the patients choosing. We have supplied the patient with a listing of providers in your area who asked to be included and participate in Medicare.    [facility], formerly [facility], a home care agency that provides adult home care services and participates in Medicare    The preferred provider of your insurance company. Choosing a home care provider other than your insurance company's preferred provider may affect your insurance coverage.      Home Health Discharge Information    Your doctor has ordered Skilled Nursing, Physical Therapy and Occupational Therapy in-home service(s) for you while you recuperate at home, to assist you in the transition from hospital to home.    The agency that you or your representative chose to provide the service:  Name of Home Health Agency Placement: Other (comment box) [facility]  [phone])      [employer]:  Name of DME Agency: [facility]  Equipment Ordered: Front wheel walker to be delivered to patient at bedside pending insurance authorization.      The above services were set up by:  Darlis Loan, RN [facility] Liaison) Phone [phone]      IF YOU HAVE NOT HEARD FROM YOUR HOME YOUR HOME HEALTH AGENCY WITHIN 24-48 HOURS AFTER DISCHARGE PLEASE CALL YOUR AGENCY TO ARRANGE A TIME FOR YOUR FIRST VISIT. FOR ANY SCHEDULING CONCERNS OR QUESTIONS RELATED TO HOME HEALTH, SUCH AS TIME OR DATE PLEASE CONTACT YOUR HOME HEALTH AGENCY AT THE NUMBER LISTED ABOVE.      Additional comments: Writer discussed home health service  recommendation with patient. Patient in agreement with [facility] and Referral sent to [facility] as per patient Choice.     Signed by: Darlis Loan, RN  Date  Time: 05/16/20 3:52 PM    HOME HEALTH REFERRAL    PATIENT DEMOGRAPHICS:        Name: Sabrina Church    Discharge Address: [address]  [place] [address]      Primary Telephone Number:  [phone]  Secondary Telephone Number: n/a  Emergency Contact and Number: Extended Emergency Contact Information  Primary Emergency Contact: Sabrina Church, Sabrina Church  Mobile Phone: [phone]  Relation: Son  Secondary Emergency Contact: Sabrina Church,Sabrina Church   [place] Phone: [phone]  Relation: Son    Ordering Physician: Rea College, MD    PCP: Hazle Coca, MD, [phone]    Following Physician: Payton Doughty, MD [phone])    Language/Communication Barrier:  No    Primary Diagnosis and Reason for Services:    #Lower extremity weakness in setting of mild rhabdomyolysis   #Normocytic anemia 2/2 iron deficiency anemia   #B12 deficiency   #Abnormal CXR w/ L sided infiltrates   #History of hyperlipidemia   #Hypertension   #Paroxysmal SVT   #COPD - stable   #Mild pulmonary hypertension   #G2 DD   #History of Takotsubo cardiomyopathy in 2013      [employer] (Labs, Wounds, Infusions, etc.): n/a    Additional Comments: SOC CALL IS NEEDED    COVID-19 Screening:    Results for Sabrina Church, Sabrina Church    Ref. Range 05/16/2020 04:53  SARS CoV 2 Overall Result Unknown Negative       Discharge Date: 05/16/20  Referral Source (PACC/Hospital/Unit): Darlis Loan, RN  Referral Date: 05/16/20        Home Health face-to-face (FTF) Encounter (Order [id number])  Consult  Date: 05/16/2020 Department: [facility] [place] [facility] [place] Ordering/Authorizing: Anselm Pancoast, MD       Order Information    Order Date/Time Release Date/Time Start Date/Time End Date/Time   05/16/20 03:49 PM None 05/16/20 03:25 PM 05/16/20 03:25 PM     Order Details    Frequency Duration Priority Order Class   Once 1 occurrence Routine Hospital Performed     Standing Order Information    Remaining Occurrences Interval Last Released     0/1 Once 05/16/2020              Provider Information    Ordering User Ordering Provider Authorizing Provider   Darlis Loan, RN Anselm Pancoast, MD Anselm Pancoast, MD   Attending Provider(s) Admitting Provider PCP   Dellis Anes, MD; Olin Pia, DO; Anselm Pancoast, MD Phylliss Blakes, MD Hazle Coca, MD     Verbal Order Info    Action Created on Order Mode Entered by Responsible Provider Signed by Signed on   Ordering 05/16/20 1549 Telephone with Woodroe Mode, RN Anselm Pancoast, MD       Comments    Rolling walker needed >99 months, NPI: [id number]     Ht: 1.6 m (5\' 3" )   Wt: 63.3 kg (139 lb 8 oz)     Abnormality of gait R26.9     Following physician: Payton Doughty, MD [phone])     Diagnosis:   #Lower extremity weakness in setting of mild rhabdomyolysis   #Normocytic anemia 2/2 iron deficiency anemia   #B12 deficiency   #Abnormal CXR w/ L sided infiltrates   #History of hyperlipidemia   #Hypertension   #Paroxysmal SVT   #COPD - stable   #Mild pulmonary hypertension   #G2 DD   #History of Takotsubo cardiomyopathy in 2013     Home nursing required for skilled assessment including cardiopulmonary assessment and dietary education for disease management, and medication instruction. Home PT/OT required for gait and balance training, strengthening, mobility, fall prevention, and ADL training.           Home Health face-to-face (FTF) Encounter: Patient Communication    Not Released Not seen     Order Questions    Question Answer   Date I saw the patient face-to-face: 05/16/2020   Evidence this patient is homebound because: C. Decreased endurance, strength, ROM, cadence, safety/judgment during mobility    D. Shortness of breath even with minimal activity or at rest    G. Fall risk due to impaired coordination, gait and decreased balance    I. Restricted to home to decrease risk of infection    N. Impaired mobility d/t pain, arthritis, weakness that compromises patient safety   Medical conditions that  necessitate Home Health care: C. Risk for complication/infection/pain requiring follow up and monitoring    E. Exacerbation of disease requiring follow up monitoring    F. New diagnosis & treatment requiring follow up monitoring and management   Per clinical findings, following services are medically necessary: Skilled Nursing    PT    OT    DME   Clinical findings that support the need for Skilled Nursing. SN will: C. Monitor for signs and symptoms of exacerbation of  disease and management    D. Review medication reconciliation, manage and educate on use and side effects    G. Educate on new diagnosis, treatment & management to prevent re-hospitalization    H. Assess cardiopulmonary status and monitor for signs &symptoms of exacerbation    N. Instruct on oxygen use and safety   Clinical findings that support the need for Physical Therapy. PT will A. Evaluate and treat functional impairment and improve mobility    D. Provide services to help restore function, mobility, and releive pain    E. Educate on functional mobility; bed, chair, sit, stand and transfer activities    F. Perform home safety assessment & develop safe in home exercise program    G. Implement activities to improve stance time, cadence & step length    H. Educate on the safe use of assistive device/ durable medical equipment    I. Instruct on restorative activities to restore ability to perform ADL   OT will provide assistance with: Home program to improve ability to perform ADLs    Recovery and maintenance skills    Basic motor function and reasoning abilities    Strategies to compensate for loss of function   DME Rolling Walker           Process Instructions    Please select Home Care Services medically necessary.     Based on the above findings, I certify that this patient is confined to the home and needs intermittent skilled nursing care, physical therapry and / or speech therapy or continues to need occupational therapy. The  patient is under my care, and I have initiated the establishment of the plan of care. This patient will be followed by a physician who will periodically review the plan of care.      Collection Information        Consult Order Info    ID Description Priority Start Date Start Time   [id number] Home Health face-to-face (FTF) Encounter Routine 05/16/2020 3:25 PM   Provider Specialty Referred to   ______________________________________ _____________________________________       Verbal Order Info    Action Created on Order Mode Entered by Responsible Provider Signed by Signed on   Ordering 05/16/20 1549 Telephone with Woodroe Mode, RN Anselm Pancoast, MD       Patient Information    Patient Name   Accomando, Sabrina Church Legal Sex   Female DOB   10/30/1940     Additional Information    Associated Reports External References   Priority and Order Details [employer]       Patient Information            Home Address: [address]  [place] [address] Employer:  Employer Address:     ,     Main Phone: [phone] Employer Phone:    SSN: [id number]     DOB: August 10, 1940 (79 yrs)     Sex: Female Primary Care Physician: Hazle Coca, MD   Marital Status: Widowed Referring Physician:       No ref. provider found   Race: White or Caucasian     Ethnicity: Non Hispanic/Latino     Emergency Contacts  Name Home Phone Work Phone Mobile Phone Relationship Sabrina Church   [place], [place]   [phone] TUNYA, HELD   [phone] Son         Guarantor Information    Guarantor Name: LADAWN, BOULLION Guarantor ID: [id number]   Guarantor Relationship  to Pt: Self Guarantor Type: Personal/Family   Guarantor DOB:   1940-11-04     Guarantor Address: [address]   [place], [place] [address]       Guarantor Home Phone: [phone] Guarantor Employer:        Guarantor Work Phone:  Pensions consultant Emp Phone:               Chief Technology Officer Name: [employer] A [employer] B Subscriber Name: [employer]    Insurance Address:   [address]  [place] [address] Subscriber DOB: 01-22-1941     Subscriber ID: [id number]   Insurance Phone:  Pt Relationship to Sub:   Self   Insurance ID:      Group Name:  Preauthorization #: NPR   Group #:  Preauthorization Days:      Art gallery manager Name: [employer] POS Subscriber Name: [employer]   Insurance Address:   [address]  [place], [place] [address] Subscriber DOB: 20-May-1940     Subscriber ID: [id number]   Insurance Phone:  Pt Relationship to Sub:   Self   Insurance ID:      Group Name:  Preauthorization #:    Group #: [id number] Preauthorization Days:

## 2020-05-16 NOTE — Progress Notes (Signed)
CASE MANAGEMENT IDPA OBS and Low Risk      Patient: Sabrina Church 80 y.o. female)  Admission Date: 05/15/2020 [facility] Day 1)    Active Hospital Problems    Diagnosis    Iron deficiency anemia, unspecified iron deficiency anemia type    Rhabdomyolysis       Length of stay: Hospital Day 1    Discussed plan of care and possible discharge needs with patient/family/care-giver Jeannie Fend, N) - if family/care-giver, provide name: y    Patient has cognitive ability to participate in care decisions and follow-up care as needed (Y, N) - provide comments if no: y    Plan of care:   Discharge Plan A: home hhpt/ot/fww, [facility] aware  Discharge Plan B: SNF    Barriers to discharge: n/a              a) Barriers needing escalation:               b) Escalated to:     Verified (and corrected if needed) demographics and PCP on facesheet (Y, N) y    Patient is NOT identified as a Medicare focused patient or high risk for failed discharge plan or readmission as detailed in the High Risk Patient Screening policy (Y, N) n    Comments:     Case management will continue to follow for any change in discharge needs.    Doyne Keel, MSW, LCSW  Social Work Case Manager  PRN Case Management

## 2020-05-16 NOTE — Plan of Care (Signed)
Problem: Safety  Goal: Patient will be free from injury during hospitalization  Outcome: Progressing  Goal: Patient will be free from infection during hospitalization  Outcome: Progressing     Problem: Pain  Goal: Pain at adequate level as identified by patient  Outcome: Progressing     Problem: Side Effects from Pain Analgesia  Goal: Patient will experience minimal side effects of analgesic therapy  Outcome: Progressing     Problem: Discharge Barriers  Goal: Patient will be discharged home or other facility with appropriate resources  Outcome: Progressing     Problem: Psychosocial and Spiritual Needs  Goal: Demonstrates ability to cope with hospitalization/illness  Outcome: Progressing

## 2020-05-20 ENCOUNTER — Other Ambulatory Visit (INDEPENDENT_AMBULATORY_CARE_PROVIDER_SITE_OTHER): Payer: Self-pay | Admitting: Interventional Cardiology

## 2020-05-20 DIAGNOSIS — I425 Other restrictive cardiomyopathy: Secondary | ICD-10-CM

## 2020-05-20 LAB — VITAMIN B1, PLASMA: Vitamin B1 (Thiamine): 7 nmol/L — ABNORMAL LOW (ref 8–30)

## 2020-05-22 ENCOUNTER — Ambulatory Visit (INDEPENDENT_AMBULATORY_CARE_PROVIDER_SITE_OTHER): Payer: Medicare Other | Admitting: Physician Assistant

## 2020-05-22 ENCOUNTER — Encounter (INDEPENDENT_AMBULATORY_CARE_PROVIDER_SITE_OTHER): Payer: Self-pay | Admitting: Physician Assistant

## 2020-05-22 VITALS — BP 110/70 | HR 86 | Ht 63.0 in | Wt 133.4 lb

## 2020-05-22 DIAGNOSIS — N179 Acute kidney failure, unspecified: Secondary | ICD-10-CM

## 2020-05-22 DIAGNOSIS — I5032 Chronic diastolic (congestive) heart failure: Secondary | ICD-10-CM

## 2020-05-22 DIAGNOSIS — I270 Primary pulmonary hypertension: Secondary | ICD-10-CM

## 2020-05-22 MED ORDER — MAGNESIUM OXIDE 400 MG PO TABS
400.0000 mg | ORAL_TABLET | Freq: Every day | ORAL | 1 refills | Status: AC
Start: 2020-05-22 — End: ?

## 2020-05-22 MED ORDER — LISINOPRIL 2.5 MG PO TABS
2.5000 mg | ORAL_TABLET | Freq: Every day | ORAL | Status: DC
Start: 2020-05-22 — End: 2020-05-31

## 2020-05-22 NOTE — Progress Notes (Signed)
[place] [facility] CARDIOLOGY OFFICE PROGRESS NOTE    HRT [facility] OFFICE      [place] [facility] OFFICE -CARDIOLOGY  [address]  [place] [address]  Dept: [phone]  Dept Fax: [phone]       Patient Name: Sabrina Church    Date of Visit:  May 22, 2020  Date of Birth: February 02, 1941  AGE: 80 y.o.  Medical Record #: [id number]  Requesting Physician: Hazle Coca, MD      CHIEF COMPLAINT: Hospital Follow-up      HISTORY OF PRESENT ILLNESS:    She is a pleasant 80 y.o. female who presents today for hospital f/u after Jan 2021 admission after being too weak to stand up from the floor after she was down on the ground at home putting away [employer]. She uses O2 NC PRN and daughter, who accompanies her to today's visit, surmises that she may have become hypoxic since she was not wearing supplemental O2 while working putting away the decorations, and this may have lead to the generalized weakness. She adamantly denies on repeated questioning any lightheadedness or presyncope or syncope. She insists she was awake the who time. She crawled around for about a day before her son came  Home and found her. She was found to have developed mild rhabdo when she was admitted. BUN was 30 and she received IV hdyration, diuretics held while inpatient (then resumed on discharge), and 2.5 mg lisinopril held on discharge. Her BP at home is around 120-130s from what daugther recalls.   TTE done Dec 2021 showed preserved EF.  She plans to see Dr Ashley Royalty pulmonary later this week.  She is on 2L continuous O2 NC now.   Denies chest pains.   No lightheadedness or falls since discharge.   Her lasix was downtitrated to 20 mg qd (from 20 mg qod alternating with 40 mg) months ago, but she re-uptitrated it to 20 mg after noticing a sock line.    Ambulatory monitor Nov 2021 showed 4% burden PVCs, 5 runs NSVT with lobgest 16 beats, occasional PACs, 6 runs SVT longest 38 seconds, and no AF.     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 She has a past surgical history that includes Hysterectomy; proaspe; Tubal ligation (Bilateral); and Cardiac catheterization (Decemeber 2013).    ALLERGIES:   Allergies   Allergen Reactions    Ceclor [Cefaclor] Hives and Rash    Amoxicillin        MEDICATIONS:   Current Outpatient Medications:     aspirin EC 81 MG EC tablet, Take 81 mg by mouth daily., Disp: , Rfl:     carvedilol (COREG) 25 MG tablet, TAKE 1 TABLET TWICE A DAY  (ADDITIONAL REFILLS FOLLOWING 03/2020 APPOINTMENT), Disp: 180 tablet, Rfl: 0    ferrous sulfate 324 (65 FE) MG Tablet Delayed Response, Take 1 tablet (324 mg total) by mouth every morning with breakfast, Disp: 30 tablet, Rfl: 0    fluticasone (FLOVENT DISKUS) 50 MCG/BLIST diskus inhaler, Inhale 250 mcg into the lungs daily., Disp: , Rfl:     furosemide (LASIX) 20 MG tablet, Take  1 tablet (20 mg total) by mouth daily With as needed increase to 40mg  if 2+ lb weight gain overnight or increased dysnpea, Disp: 180 tablet, Rfl: 0    levalbuterol (XOPENEX HFA) 45 MCG/ACT inhaler, Inhale 1-2 puffs into the lungs every 4 (four) hours as needed., Disp: , Rfl:     lisinopril (ZESTRIL) 2.5 MG tablet, Take 1 tablet (2.5 mg total) by mouth daily, Disp: , Rfl:     magnesium oxide (MAG-OX) 400 MG tablet, Take 1 tablet (400 mg total) by mouth daily, Disp:  90 tablet, Rfl: 1    rosuvastatin (CRESTOR) 20 MG tablet, TAKE 1 TABLET AT BEDTIME (ADDITIONAL REFILLS FOLLOWING 03/2020 APPOINTMENT), Disp: 90 tablet, Rfl: 0    tiotropium (SPIRIVA) 18 MCG inhalation capsule, Place 18 mcg into inhaler and inhale 2 (two) times daily , Disp: , Rfl:     vitamin B-12 (VITAMIN B12) 500 MCG tablet, Take 1 tablet (500 mcg total) by mouth daily, Disp: 30 tablet, Rfl: 0     FAMILY HISTORY: family history includes COPD in her mother; Cardiomyopathy in her brother; Diabetes type II in her mother; Heart disease in her father, mother, and sister; Heart failure in her mother; Myocardial Infarction in her father.    SOCIAL HISTORY: She reports that she quit smoking about 21 years ago. She has a 60.00 pack-year smoking history. She has never used smokeless tobacco. She reports that she does not drink alcohol and does not use drugs.    PHYSICAL EXAMINATION    Visit Vitals  BP 110/70 (BP Site: Left arm, Patient Position: Sitting, Cuff Size: Medium)   Pulse 86   Ht 1.6 m (5\' 3" )   Wt 60.5 kg (133 lb 6.4 oz)   BMI 23.63 kg/m       General Appearance:  A well-appearing female in no acute distress.    Skin: Warm and dry to touch   Head: Normocephalic   Eyes: EOMI  ENT: masked   Neck: JVP normal, no carotid bruit    Chest: Clear to auscultation bilaterally    Cardiovascular: Regular rhythm, no overt murm  Abdomen: no guarding   Extremities: Warm without edema.    Neuro: Alert and oriented x3.         LABS:   Lab Results   Component Value Date    WBC 14.22 (H) 05/16/2020    HGB 10.1 (L) 05/16/2020    HCT 32.9 (L) 05/16/2020    PLT 166 05/16/2020     Lab Results   Component Value Date    GLU 85 05/16/2020    BUN 21.0 (H) 05/16/2020    CREAT 0.6 05/16/2020    NA 135 (L) 05/16/2020    K 3.8 05/16/2020    CL 103 05/16/2020    CO2 21 (L) 05/16/2020    AST 34 05/15/2020    ALT 13 05/15/2020     Lab Results   Component Value Date    MG 2.1 05/16/2020    TSH 2.23 05/16/2020    HGBA1C 6.1 (H) 02/09/2012    BNP  175 (H) 05/15/2020     Lab Results   Component Value Date    CHOL 127 10/12/2018    TRIG 94 10/12/2018    HDL 54 10/12/2018    LDL 55 10/12/2018         IMPRESSION:   Ms. Bullen is a 80 y.o. 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  New York Heart Association class II, euvolemic    TTE Dec 2021 EF preserved, moderate MR, mild pulmonary HTN, G2DD   Frequent PVCs, 4% burden by Nov 2021 ambulatory monitor   ? ECG taken after our visit with no PVCs, bifascicular block right bundle/left anterior fascicle  ? 24-hour Holter Oct 2018 PVC burden 2%, average heart rate 58 bpm  ? Holter Dec 2014 PVC burden 6.4% single 4 beat NSVT  ? Holter July 2014 PVC burden 6.1%, 2 runs NSVT longest 9 beats  ? Holter Apr 2014 PVC burden 13.3%, single 12 beat run NSVT  ? Holter Nov 2013 PVC burden 21%, 6 runs NSVT longest 12 beats  ? Holter January 2012 PVC burden 19.7%, 36 runs NSVT longest 10 beats      Non-ischemic cardiomyopathy with recovered EF  ? Likely PVC related, 2013 vs stress Takotsubo CM  ? Echo May 2020 LV size function normal EF 55%, normal RV, aortic sclerosis, trace TR  ? Echo October 2013 moderate LV dysfunction diffuse hypokinesis EF 35%, aortic sclerosis, mild mitral annular calcification and mild MR, mild left atrial enlargement, mild to moderate TR     Pre-operative cataracts    Chronic bronchitis followed by Dr. Wilma Flavin following Dr. Birdie Sons retirement, triggered by allergy or change in weather, not frequently requiring inhaler but twice treated with short course steroids and antibiotics in the last 3 to 6 months   Symptomatic palpitations with a history of PSVT and suggestive of the same, broken easily with vagal maneuver and with rare triggers   Asymptomatic moderate carotid atherosclerosis  ? Carotid duplex November 2020 bilateral moderate plaque w/ bilateral 50 to 69%, no change from 2019   Intolerance to high-dose  atorvastatin   Angiographically normal coronaries in 2013 with no interim angina or progressive effort intolerance and no interim ischemic eval.       RECOMMENDATIONS:      Reduce lasix back to 20 mg qd from 20 mg qod alternating with 40 mg given evidence of dehydration with elevated BUN on recent admission. Call if bothersome edema or significant wt gain. Consider compression hose.     Restart 2.5 mg lisinopril for GDMT of CM with recovered EF   Remain on coreg and other cardiac medicines   Keep daily home BP log and call if lightheaded, dizzy, feeling poorly, or systolics below 100   BMP 2 weeks    F/u APP 1 mo                                               Orders Placed This Encounter   Procedures    Basic Metabolic Panel    APP Office Visit (HRT [place])       Orders Placed This Encounter   Medications    lisinopril (ZESTRIL) 2.5 MG tablet     Sig: Take 1 tablet (2.5 mg total) by mouth daily    magnesium oxide (MAG-OX) 400 MG tablet     Sig: Take 1 tablet (400 mg total) by mouth daily     Dispense:  90 tablet     Refill:  1       SIGNED:    Lewanda Rife, PA          This note was generated by the Dragon speech recognition and may contain errors or omissions not intended  by the user. Grammatical errors, random word insertions, deletions, pronoun errors, and incomplete sentences are occasional consequences of this technology due to software limitations. Not all errors are caught or corrected. If there are questions or concerns about the content of this note or information contained within the body of this dictation, they should be addressed directly with the author for clarification.

## 2020-05-22 NOTE — Patient Instructions (Signed)
Reduce lasix to 20 mg once daily  Consider compression hose and leg elevation  Restart lisinopril 2.5 mg once daily  Check home blood pressures daily and call if top number less than 100 or feeling bad - let us know if lightheaded, dizzy, or otherwise feeling poorly.  Blood work in 2 weeks

## 2020-05-24 DIAGNOSIS — I272 Pulmonary hypertension, unspecified: Secondary | ICD-10-CM | POA: Insufficient documentation

## 2020-05-24 DIAGNOSIS — R0902 Hypoxemia: Secondary | ICD-10-CM | POA: Insufficient documentation

## 2020-05-30 ENCOUNTER — Emergency Department: Payer: Medicare Other

## 2020-05-30 ENCOUNTER — Inpatient Hospital Stay
Admission: EM | Admit: 2020-05-30 | Discharge: 2020-06-01 | DRG: 871 | Disposition: A | Discharge status: Home Health | Payer: Medicare Other | Attending: Hospitalist | Admitting: Hospitalist

## 2020-05-30 DIAGNOSIS — G459 Transient cerebral ischemic attack, unspecified: Secondary | ICD-10-CM | POA: Insufficient documentation

## 2020-05-30 DIAGNOSIS — Z8673 Personal history of transient ischemic attack (TIA), and cerebral infarction without residual deficits: Secondary | ICD-10-CM | POA: Insufficient documentation

## 2020-05-30 DIAGNOSIS — Y95 Nosocomial condition: Secondary | ICD-10-CM | POA: Diagnosis present

## 2020-05-30 DIAGNOSIS — I10 Essential (primary) hypertension: Secondary | ICD-10-CM

## 2020-05-30 DIAGNOSIS — Z7982 Long term (current) use of aspirin: Secondary | ICD-10-CM

## 2020-05-30 DIAGNOSIS — R6510 Systemic inflammatory response syndrome (SIRS) of non-infectious origin without acute organ dysfunction: Secondary | ICD-10-CM

## 2020-05-30 DIAGNOSIS — Z66 Do not resuscitate: Secondary | ICD-10-CM | POA: Diagnosis present

## 2020-05-30 DIAGNOSIS — N3941 Urge incontinence: Secondary | ICD-10-CM | POA: Diagnosis present

## 2020-05-30 DIAGNOSIS — R0689 Other abnormalities of breathing: Secondary | ICD-10-CM

## 2020-05-30 DIAGNOSIS — A419 Sepsis, unspecified organism: Principal | ICD-10-CM | POA: Diagnosis present

## 2020-05-30 DIAGNOSIS — Z23 Encounter for immunization: Secondary | ICD-10-CM

## 2020-05-30 DIAGNOSIS — D72829 Elevated white blood cell count, unspecified: Secondary | ICD-10-CM

## 2020-05-30 DIAGNOSIS — I110 Hypertensive heart disease with heart failure: Secondary | ICD-10-CM | POA: Diagnosis present

## 2020-05-30 DIAGNOSIS — R0902 Hypoxemia: Secondary | ICD-10-CM

## 2020-05-30 DIAGNOSIS — G9340 Encephalopathy, unspecified: Secondary | ICD-10-CM

## 2020-05-30 DIAGNOSIS — N390 Urinary tract infection, site not specified: Secondary | ICD-10-CM | POA: Diagnosis present

## 2020-05-30 DIAGNOSIS — I425 Other restrictive cardiomyopathy: Secondary | ICD-10-CM | POA: Diagnosis present

## 2020-05-30 DIAGNOSIS — R079 Chest pain, unspecified: Secondary | ICD-10-CM

## 2020-05-30 DIAGNOSIS — Z8249 Family history of ischemic heart disease and other diseases of the circulatory system: Secondary | ICD-10-CM

## 2020-05-30 DIAGNOSIS — I4510 Unspecified right bundle-branch block: Secondary | ICD-10-CM | POA: Diagnosis present

## 2020-05-30 DIAGNOSIS — I5032 Chronic diastolic (congestive) heart failure: Secondary | ICD-10-CM | POA: Diagnosis present

## 2020-05-30 DIAGNOSIS — Z9981 Dependence on supplemental oxygen: Secondary | ICD-10-CM

## 2020-05-30 DIAGNOSIS — J9621 Acute and chronic respiratory failure with hypoxia: Secondary | ICD-10-CM | POA: Diagnosis present

## 2020-05-30 DIAGNOSIS — J189 Pneumonia, unspecified organism: Secondary | ICD-10-CM | POA: Diagnosis present

## 2020-05-30 DIAGNOSIS — I959 Hypotension, unspecified: Secondary | ICD-10-CM

## 2020-05-30 DIAGNOSIS — D509 Iron deficiency anemia, unspecified: Secondary | ICD-10-CM | POA: Diagnosis present

## 2020-05-30 DIAGNOSIS — Z8701 Personal history of pneumonia (recurrent): Secondary | ICD-10-CM

## 2020-05-30 DIAGNOSIS — N3000 Acute cystitis without hematuria: Secondary | ICD-10-CM

## 2020-05-30 DIAGNOSIS — Z79899 Other long term (current) drug therapy: Secondary | ICD-10-CM

## 2020-05-30 DIAGNOSIS — I2510 Atherosclerotic heart disease of native coronary artery without angina pectoris: Secondary | ICD-10-CM | POA: Diagnosis present

## 2020-05-30 DIAGNOSIS — B9620 Unspecified Escherichia coli [E. coli] as the cause of diseases classified elsewhere: Secondary | ICD-10-CM | POA: Diagnosis present

## 2020-05-30 DIAGNOSIS — Z825 Family history of asthma and other chronic lower respiratory diseases: Secondary | ICD-10-CM

## 2020-05-30 DIAGNOSIS — E785 Hyperlipidemia, unspecified: Secondary | ICD-10-CM | POA: Diagnosis present

## 2020-05-30 DIAGNOSIS — J449 Chronic obstructive pulmonary disease, unspecified: Secondary | ICD-10-CM

## 2020-05-30 DIAGNOSIS — G928 Other toxic encephalopathy: Secondary | ICD-10-CM | POA: Diagnosis present

## 2020-05-30 DIAGNOSIS — J470 Bronchiectasis with acute lower respiratory infection: Secondary | ICD-10-CM | POA: Diagnosis present

## 2020-05-30 DIAGNOSIS — I480 Paroxysmal atrial fibrillation: Secondary | ICD-10-CM | POA: Diagnosis present

## 2020-05-30 DIAGNOSIS — J439 Emphysema, unspecified: Secondary | ICD-10-CM | POA: Diagnosis present

## 2020-05-30 DIAGNOSIS — Z20822 Contact with and (suspected) exposure to covid-19: Secondary | ICD-10-CM | POA: Diagnosis present

## 2020-05-30 DIAGNOSIS — Z87891 Personal history of nicotine dependence: Secondary | ICD-10-CM

## 2020-05-30 LAB — PT AND APTT
PT INR: 1.1 (ref 0.9–1.1)
PT: 12.7 s (ref 10.1–12.9)
PTT: 30 s (ref 27–39)

## 2020-05-30 LAB — CBC AND DIFFERENTIAL
Absolute NRBC: 0 10*3/uL (ref 0.00–0.00)
Basophils Absolute Automated: 0.03 10*3/uL (ref 0.00–0.08)
Basophils Automated: 0.1 %
Eosinophils Absolute Automated: 0 10*3/uL (ref 0.00–0.44)
Eosinophils Automated: 0 %
Hematocrit: 35.9 % (ref 34.7–43.7)
Hgb: 11 g/dL — ABNORMAL LOW (ref 11.4–14.8)
Immature Granulocytes Absolute: 0.45 10*3/uL — ABNORMAL HIGH (ref 0.00–0.07)
Immature Granulocytes: 2.1 %
Lymphocytes Absolute Automated: 1.61 10*3/uL (ref 0.42–3.22)
Lymphocytes Automated: 7.4 %
MCH: 28.4 pg (ref 25.1–33.5)
MCHC: 30.6 g/dL — ABNORMAL LOW (ref 31.5–35.8)
MCV: 92.8 fL (ref 78.0–96.0)
MPV: 10 fL (ref 8.9–12.5)
Monocytes Absolute Automated: 1.09 10*3/uL — ABNORMAL HIGH (ref 0.21–0.85)
Monocytes: 5 %
Neutrophils Absolute: 18.6 10*3/uL — ABNORMAL HIGH (ref 1.10–6.33)
Neutrophils: 85.4 %
Nucleated RBC: 0 /100 WBC (ref 0.0–0.0)
Platelets: 241 10*3/uL (ref 142–346)
RBC: 3.87 10*6/uL — ABNORMAL LOW (ref 3.90–5.10)
RDW: 15 % (ref 11–15)
WBC: 21.78 10*3/uL — ABNORMAL HIGH (ref 3.10–9.50)

## 2020-05-30 LAB — PROCALCITONIN: Procalcitonin: 13.05 — ABNORMAL HIGH (ref 0.00–0.10)

## 2020-05-30 LAB — COMPREHENSIVE METABOLIC PANEL
ALT: 9 U/L (ref 0–55)
AST (SGOT): 14 U/L (ref 5–34)
Albumin/Globulin Ratio: 0.9 (ref 0.9–2.2)
Albumin: 2.9 g/dL — ABNORMAL LOW (ref 3.5–5.0)
Alkaline Phosphatase: 97 U/L (ref 37–106)
Anion Gap: 12 (ref 5.0–15.0)
BUN: 28 mg/dL — ABNORMAL HIGH (ref 7.0–19.0)
Bilirubin, Total: 0.9 mg/dL (ref 0.2–1.2)
CO2: 28 mEq/L (ref 22–29)
Calcium: 8.6 mg/dL (ref 7.9–10.2)
Chloride: 95 mEq/L — ABNORMAL LOW (ref 100–111)
Creatinine: 1 mg/dL (ref 0.6–1.0)
Globulin: 3.3 g/dL (ref 2.0–3.6)
Glucose: 137 mg/dL — ABNORMAL HIGH (ref 70–100)
Potassium: 4.1 mEq/L (ref 3.5–5.1)
Protein, Total: 6.2 g/dL (ref 6.0–8.3)
Sodium: 135 mEq/L — ABNORMAL LOW (ref 136–145)

## 2020-05-30 LAB — COVID-19 (SARS-COV-2) & INFLUENZA  A/B, NAA (ROCHE LIAT)
Influenza A: NOT DETECTED
Influenza B: NOT DETECTED
SARS CoV 2 Overall Result: NOT DETECTED

## 2020-05-30 LAB — TROPONIN I: Troponin I: 0.02 ng/mL (ref 0.00–0.05)

## 2020-05-30 LAB — MAGNESIUM: Magnesium: 2.4 mg/dL (ref 1.6–2.6)

## 2020-05-30 LAB — LACTIC ACID, PLASMA: Lactic Acid: 1.1 mmol/L (ref 0.2–2.0)

## 2020-05-30 LAB — GFR: EGFR: 53.2

## 2020-05-30 LAB — CK: Creatine Kinase (CK): 46 U/L (ref 29–168)

## 2020-05-30 MED ORDER — SODIUM CHLORIDE 0.9 % IV BOLUS
500.0000 mL | Freq: Once | INTRAVENOUS | Status: AC
Start: 2020-05-30 — End: 2020-05-30
  Administered 2020-05-30: 19:00:00 500 mL via INTRAVENOUS

## 2020-05-30 MED ORDER — ALBUTEROL-IPRATROPIUM 2.5-0.5 (3) MG/3ML IN SOLN
3.0000 mL | Freq: Four times a day (QID) | RESPIRATORY_TRACT | Status: DC
Start: 2020-05-31 — End: 2020-05-30

## 2020-05-30 MED ORDER — MELATONIN 3 MG PO TABS
3.0000 mg | ORAL_TABLET | Freq: Every evening | ORAL | Status: DC | PRN
Start: 2020-05-30 — End: 2020-06-01

## 2020-05-30 MED ORDER — SODIUM CHLORIDE 0.9 % IV BOLUS
500.0000 mL | Freq: Once | INTRAVENOUS | Status: AC
Start: 2020-05-30 — End: 2020-05-30
  Administered 2020-05-30: 17:00:00 500 mL via INTRAVENOUS

## 2020-05-30 MED ORDER — DEXTROSE 10 % IV BOLUS
12.5000 g | INTRAVENOUS | Status: DC | PRN
Start: 2020-05-30 — End: 2020-06-01

## 2020-05-30 MED ORDER — VANCOMYCIN 1000 MG IN 250 ML NS IVPB VIAL-MATE (CNR)
1000.0000 mg | Freq: Once | INTRAVENOUS | Status: AC
Start: 2020-05-30 — End: 2020-05-30
  Administered 2020-05-30: 19:00:00 1000 mg via INTRAVENOUS
  Filled 2020-05-30: qty 250

## 2020-05-30 MED ORDER — FLUTICASONE FUROATE-VILANTEROL 200-25 MCG/INH IN AEPB
1.0000 | INHALATION_SPRAY | Freq: Every morning | RESPIRATORY_TRACT | Status: DC
Start: 2020-05-31 — End: 2020-06-01
  Administered 2020-05-31 – 2020-06-01 (×2): 1 via RESPIRATORY_TRACT
  Filled 2020-05-30: qty 14

## 2020-05-30 MED ORDER — ALBUTEROL-IPRATROPIUM 2.5-0.5 (3) MG/3ML IN SOLN
3.0000 mL | Freq: Four times a day (QID) | RESPIRATORY_TRACT | Status: DC | PRN
Start: 2020-05-30 — End: 2020-05-30

## 2020-05-30 MED ORDER — ASPIRIN 81 MG PO CHEW
81.0000 mg | CHEWABLE_TABLET | Freq: Every day | ORAL | Status: DC
Start: 2020-05-31 — End: 2020-06-01
  Administered 2020-05-31 – 2020-06-01 (×2): 81 mg via ORAL
  Filled 2020-05-30 (×2): qty 1

## 2020-05-30 MED ORDER — ALBUTEROL-IPRATROPIUM 2.5-0.5 (3) MG/3ML IN SOLN
3.0000 mL | Freq: Four times a day (QID) | RESPIRATORY_TRACT | Status: DC | PRN
Start: 2020-05-30 — End: 2020-06-01

## 2020-05-30 MED ORDER — SODIUM CHLORIDE 0.9 % IV BOLUS
1000.0000 mL | Freq: Once | INTRAVENOUS | Status: DC
Start: 2020-05-30 — End: 2020-05-30

## 2020-05-30 MED ORDER — NALOXONE HCL 0.4 MG/ML IJ SOLN (WRAP)
0.2000 mg | INTRAMUSCULAR | Status: DC | PRN
Start: 2020-05-30 — End: 2020-06-01

## 2020-05-30 MED ORDER — CEFEPIME HCL 2 G IJ SOLR
2.0000 g | Freq: Once | INTRAMUSCULAR | Status: DC
Start: 2020-05-30 — End: 2020-05-30

## 2020-05-30 MED ORDER — VANCOMYCIN HCL IN NACL 1.5-0.9 GM/500ML-% IV SOLN
1500.0000 mg | Freq: Once | INTRAVENOUS | Status: DC
Start: 2020-05-30 — End: 2020-05-30

## 2020-05-30 MED ORDER — GLUCAGON 1 MG IJ SOLR (WRAP)
1.0000 mg | INTRAMUSCULAR | Status: DC | PRN
Start: 2020-05-30 — End: 2020-06-01

## 2020-05-30 MED ORDER — TIOTROPIUM BROMIDE MONOHYDRATE 2.5 MCG/ACT IN AERS
2.0000 | INHALATION_SPRAY | Freq: Every morning | RESPIRATORY_TRACT | Status: DC
Start: 2020-05-31 — End: 2020-06-01
  Administered 2020-05-31 – 2020-06-01 (×2): 2 via RESPIRATORY_TRACT
  Filled 2020-05-30: qty 1

## 2020-05-30 MED ORDER — ENOXAPARIN SODIUM 40 MG/0.4ML SC SOLN
40.0000 mg | Freq: Every day | SUBCUTANEOUS | Status: DC
Start: 2020-05-31 — End: 2020-06-01
  Administered 2020-05-31: 10:00:00 40 mg via SUBCUTANEOUS
  Filled 2020-05-30 (×2): qty 0.4

## 2020-05-30 MED ORDER — SODIUM CHLORIDE 0.9 % IV MBP
2.0000 g | Freq: Once | INTRAVENOUS | Status: AC
Start: 2020-05-30 — End: 2020-05-30
  Administered 2020-05-30: 17:00:00 2 g via INTRAVENOUS
  Filled 2020-05-30: qty 2

## 2020-05-30 MED ORDER — SODIUM CHLORIDE 0.9 % IV MBP
2.0000 g | INTRAVENOUS | Status: DC
Start: 2020-05-31 — End: 2020-05-31

## 2020-05-30 MED ORDER — METOPROLOL TARTRATE 25 MG PO TABS
12.5000 mg | ORAL_TABLET | Freq: Two times a day (BID) | ORAL | Status: DC
Start: 2020-05-31 — End: 2020-06-01
  Administered 2020-05-31 – 2020-06-01 (×2): 12.5 mg via ORAL
  Filled 2020-05-30 (×3): qty 1

## 2020-05-30 MED ORDER — ROSUVASTATIN CALCIUM 20 MG PO TABS
20.0000 mg | ORAL_TABLET | Freq: Every evening | ORAL | Status: DC
Start: 2020-05-30 — End: 2020-06-01
  Administered 2020-05-31 (×2): 20 mg via ORAL
  Filled 2020-05-30 (×2): qty 1

## 2020-05-30 MED ORDER — GLUCOSE 40 % PO GEL
15.0000 g | ORAL | Status: DC | PRN
Start: 2020-05-30 — End: 2020-06-01

## 2020-05-30 MED ORDER — ACETAMINOPHEN 325 MG PO TABS
650.0000 mg | ORAL_TABLET | Freq: Four times a day (QID) | ORAL | Status: DC | PRN
Start: 2020-05-30 — End: 2020-06-01

## 2020-05-30 NOTE — Progress Notes (Signed)
05/30/20 2342   BiPAP/CPAP Activities   Reason Not Started/Completed Not Started;Refused  (pt stated she never use home CPAP)   Pt refused CPAP, stable on 2L NC

## 2020-05-30 NOTE — ED Triage Notes (Signed)
Arrives to ED with son for c/o worsening SOB along with reported slurred speech which began yesterday. Patient presenting at this time without any slurred speech, no focal neuro deficits. AOx4, answering questions appropriately. Wears 2L O2 via NC at home at baseline. Reports dry, productive cough which has worsened over past 2 days. Son reports that patient's home health aides have stated the patient has been confused over the past 2 days.

## 2020-05-30 NOTE — H&P (Signed)
Admission History & Physical    Date Time: 05/30/2020 10:40 PM  Patient Name: Sabrina Church  MRN: [id number]  Attending Physician: Boykin Peek, MD  Admit Date: 05/30/2020    CC:   Confusion    -upon arrival: afeb, HR 70s, BP 90/40 - 110/50s  -labs notable for wbc 22k  -CXR with infiltrates  -EKG with chronic RBBB  -given 1L NS in ER + Vanco + Zosyn in ER    Patient Active Problem List   Diagnosis    COPD (chronic obstructive pulmonary disease)    Microscopic hematuria    Fecal urgency    Premature heartbeats NOS    Occlusion of extracranial carotid artery, bilateral    Primary pulmonary hypertension    SOB (shortness of breath)    Primary familial cardiomyopathy    Lower extremity edema    Primary restrictive cardiomyopathy    Rhabdomyolysis    Iron deficiency anemia, unspecified iron deficiency anemia type    TIA (transient ischemic attack)     Assessment & Plan:   47F h/o Takotsubo (2013), HFpEF (G2DD), COPD, TIA, Urinary Incontinence here with confusion at home found to have Hypotension (recent cardiac med changes) with Leukocytosis concerning for SIRS 2/2 UTI vs PNA.    #Leukocytosis  #Hypotension  -s/p 1L NS in ER  -s/p Vanco + Cefepime in ER, continue pending further culture results  -recently admission but was not discharged on abx, CXR remains abnormal with possibility of PNA vs scarring    #COPD without Acute Exacerbation  #Acute on Chronic Respiration failure  #Abnormal CXR  -uses 2L O2 at home  -continue home Spiriva, substitute Breo for home Advair + PRN Duonebs  -abx as above  -followed by [facility]    #CAD  #h/o Takotsubo 2013  #h/o Tachyarrhythmia  #HFpEF with #G2DD  #moderate MR  -patient very upset about labile pressure from recent med changes  -hold all GDMT for now given labile pressures  -hold home Lasix pending clinical course with possible sepsis  -continue ASA + Statin for risk reduction  -followed by [facility]    Global:  -DVT: Lovenox    Code Status: NO CPR - SUPPORT OK -  confirmed DNR/DNI with patient and son  History of Present Illness:   This is a pleasant Sabrina Church who lives alone (has aides) and is dependent with most ADLs.  Patient recently discharged from hospital on 1/13 in setting of AKI/Rhabdo.  She was later found to have Ecoli UTI but was not discharged with abx.  She now returns with confusion at home (noted by aids) as well as episodes of chills in background of urinary incontinence.  Her GDMT was recently adjusted and patient thinks this could have been contributing factor.  Her BP was found to be low at home as well.    In there ER she is AOx3 and appears to be back to baseline mentation per son at bedside.    Followed by Dr Wilma Flavin for COPD, no recent exacerbations.    Context: SOB  Location: chest  Onset: chronic  Quality: exertional  Severity: moderate  Duration: weeks  Radiation: n/a  Modifying factor: activity  Associated symptoms: wekness  Prior history of similar event: yes  Therapy attempted thus far: none    Review of Systems:   Constitutional: +chills  HEENT: +cough  Cardiovascular: denies chest pain  Pulmonary: +SOB  Gastrointestinal: denies abdominal pain  Genitourinary: +incontinence  Hematologic: +easy bruising  Neurologic: +lightheadedness  All other systems were  reviewed and are negative unless stated above per HPI.  Past Medical History:     Past Medical History:   Diagnosis Date    Arrhythmia-PSVT     Arrhythmia-PVCs     Asthma     Broken heart syndrome 05-Oct-2011    s/p death of spouse    Carotid artery disease     COPD (chronic obstructive pulmonary disease)     Used to follow w/ [facility] for COPD/Asthma. Has hx of recurrent PNA's.    Dyspnea on exertion     Echocardiogram 02/2012;11/2012;08/01/13;02/2016;05/2017    Holter Monitor 03/2012;02/2017    Hypertension     Lower extremity edema 10/27/2019    Myocardial perfusion scan 02/12/2012    OSA (obstructive sleep apnea) 07/19/2012    Primary pulmonary hypertension 10/27/2019    Primary  restrictive cardiomyopathy 10/27/2019    Pulmonary fibrosis     Pulmonary HTN     Seasonal allergic rhinitis     Stress-induced cardiomyopathy     Ventricular tachycardia 10/27/2019     Past Surgical History:     Past Surgical History:   Procedure Laterality Date    CARDIAC CATHETERIZATION  Decemeber 2011-10-05    HYSTERECTOMY      proaspe      TUBAL LIGATION Bilateral      Family History:     Family History   Problem Relation Age of Onset    Heart disease Mother     Heart failure Mother     COPD Mother     Diabetes type II Mother     Heart disease Father     Myocardial Infarction Father     Heart disease Sister     Cardiomyopathy Brother      Social History:     Social History     Tobacco Use   Smoking Status Former Smoker    Packs/day: 1.50    Years: 40.00    Pack years: 60.00    Quit date: 12/09/1998    Years since quitting: 21.4   Smokeless Tobacco Never Used     Social History     Substance and Sexual Activity   Alcohol Use No     Social History     Substance and Sexual Activity   Drug Use No     Allergies:   Ceclor [Cefaclor]  Amoxicillin  Home Medications:     Home Medications             aspirin EC 81 MG EC tablet     Take 81 mg by mouth daily.     carvedilol (COREG) 25 MG tablet     TAKE 1 TABLET TWICE A DAY  (ADDITIONAL REFILLS FOLLOWING 03/2020 APPOINTMENT)     ferrous sulfate 324 (65 FE) MG Tablet Delayed Response     Take 1 tablet (324 mg total) by mouth every morning with breakfast     fluticasone (FLOVENT DISKUS) 50 MCG/BLIST diskus inhaler     Inhale 250 mcg into the lungs daily.     furosemide (LASIX) 20 MG tablet     Take 1 tablet (20 mg total) by mouth daily With as needed increase to 40mg  if 2+ lb weight gain overnight or increased dysnpea     levalbuterol (XOPENEX HFA) 45 MCG/ACT inhaler     Inhale 1-2 puffs into the lungs every 4 (four) hours as needed.     lisinopril (ZESTRIL) 2.5 MG tablet     Take 1 tablet (2.5 mg total) by mouth  daily     magnesium oxide (MAG-OX) 400 MG tablet      Take 1 tablet (400 mg total) by mouth daily     rosuvastatin (CRESTOR) 20 MG tablet     TAKE 1 TABLET AT BEDTIME (ADDITIONAL REFILLS FOLLOWING 03/2020 APPOINTMENT)     tiotropium (SPIRIVA) 18 MCG inhalation capsule     Place 18 mcg into inhaler and inhale 2 (two) times daily       vitamin B-12 (VITAMIN B12) 500 MCG tablet     Take 1 tablet (500 mcg total) by mouth daily        Inpatient Medications:   [START ON 05/31/2020] aspirin, 81 mg, Daily  [START ON 05/31/2020] cefepime, 2 g, Q24H  [START ON 05/31/2020] enoxaparin, 40 mg, Daily  [START ON 05/31/2020] fluticasone furoate-vilanterol, 1 puff, QAM  rosuvastatin, 20 mg, QHS  [START ON 05/31/2020] tiotropium, 2 puff, QAM         Physical Exam:   Temp:  [97.8 F (36.6 C)-98 F (36.7 C)] 97.8 F (36.6 C)  Heart Rate:  [66-111] 111  Resp Rate:  [17-30] 19  BP: (88-113)/(6-49) 108/6  General: alert and interactive, constitutionally well appearing for age, not diaphoretic at rest, able to protect airway and cough  HEENT: atraumatic, symmetric facial features, anicteric sclera, mask covering nose and mouth  Thorax: symmetric chest rise  Cardiovascular: RRR with SEM  Pulmonary: course breath sounds, non-labored without audible wheeze  Abdomen: soft, non-tender without peritoneal signs  Musculoskeletal: sarcopenia  Extremities: without deformity, trace edema  Neurologic: no focal findings or paresis, conversant  Psychiatric: fair insight, appropriate affect, AOx3  Labs:     Recent Labs     05/30/20  1546   WBC 21.78*   Hgb 11.0*   Platelets 241     Recent Labs     05/30/20  1546   Sodium 135*   Potassium 4.1   Chloride 95*   CO2 28   BUN 28.0*   Creatinine 1.0     Recent Labs     05/30/20  1546   ALT 9   AST (SGOT) 14   Alkaline Phosphatase 97   Albumin 2.9*   Bilirubin, Total 0.9     Lab Results   Component Value Date    INR 1.1 05/30/2020    TSH 2.23 05/16/2020    HGBA1C 6.1 (H) 02/09/2012    CHOL 127 10/12/2018    TRIG 94 10/12/2018    HDL 54 10/12/2018    LDL 55  10/12/2018     Diagnostics/Imaging:   Reviewed    Wynona Canes, MD  Internal Medicine Resident  [facility]

## 2020-05-30 NOTE — ED Notes (Signed)
Pt to CT at this time in nad

## 2020-05-30 NOTE — ED Notes (Signed)
[facility] EMERGENCY DEPT  ED NURSING NOTE FOR THE RECEIVING INPATIENT NURSE   ED NURSE Joey/Suzanne   [place] [address]   ED CHARGE RN Corrie Dandy   ADMISSION INFORMATION   Sabrina Church is a 80 y.o. female admitted with an ED diagnosis of:    1. TIA (transient ischemic attack)    2. HCAP (healthcare-associated pneumonia)         Isolation: None   Allergies: Ceclor [cefaclor] and Amoxicillin   Holding Orders confirmed? Yes   Belongings Documented? Yes   Home medications sent to pharmacy confirmed? N/A   NURSING CARE   Patient Comes From:   Mental Status: Home Independent  alert and oriented   ADL: Independent with all ADLs   Ambulation: Unable to assess   Pertinent Information  and Safety Concerns: Arrives to ED for c/o worsening SOB. Baseline 2L O2 via NC, remains on 2L in ED but has had worsening SOB. Family states that home health aid recently reported brief episode of slurred speech and confusion. CT Head negative. Patient AOx4 at this time. WBC Elevated. Given ABX, 1L total NSL in ED.      CT / NIH   CT Head ordered on this patient?  Yes   NIH/Dysphagia assessment done prior to admission? N/A   VITAL SIGNS (at the time of this note)      Vitals:    05/30/20 2008   BP: 101/47   Pulse: 76   Resp: 17   Temp:    SpO2: 99%

## 2020-05-30 NOTE — ED Provider Notes (Addendum)
[facility] EMERGENCY DEPARTMENT H&P      Visit date: 05/30/2020      CLINICAL SUMMARY           Diagnosis:    .     Final diagnoses:   TIA (transient ischemic attack)   HCAP (healthcare-associated pneumonia)   Respiratory insufficiency   SIRS (systemic inflammatory response syndrome)   Leukocytosis, unspecified type         MDM Notes:        This is an 80 y.o female w/pmhx of and recent admission for mild rhabdo, sent here by PMD due to worsening cough and 1 day hx of confusion, difficulty with speech and walking (both have improved). Cannot exclude TIA/CVA, not a tPA candidate due to onset. CT head no acute abn. WBC of 21, CXR w/worsening rt vs left sided infiltrates. Abx for HCAP given recent admission.  Of note patient had a blood pressure medication discontinued while she was admitted due to persistent low blood pressure.  She was restarted on lisinopril by her cardiologist, primary noted that it is causing her blood pressure to be low (according to the son of the patient).           Disposition:         Inpatient Admit      ED Disposition     ED Disposition Condition Date/Time Comment    Admit  Thu May 30, 2020 2009 Admitting Physician: Olin Pia [[id number]]   Service:: Medicine [[id number]]   Estimated Length of Stay: > or = to 2 midnights   Tentative Discharge Plan?: Home or Self Care [1]   Does patient need telemetry?: Yes   Telemetry type (separate Telemetry order is also required):: Cardiac telemetry                           CLINICAL INFORMATION        HPI:      Chief Complaint: No chief complaint on file.  Sabrina Church is a 80 y.o. female w/pmhx of asthma, COPD, pulm HTN and fibrosis who presents here due to 1 day hx of noted worsening cough, SOB, as well as confusion and difficulty with speech (which has improved). Pt reports that shortness of breath is better today.  She saw her primary care physician and was sent here.  She is accompanied by her son.  Denies any  fevers or chills.  No chest pain.  Denies any abdominal pain nausea vomiting or diarrhea.  No melena or hematochezia.  Denies any dysuria or hematuria.  No rashes or swelling.    History obtained from: patient, review of prior chart, family          ROS:      Positive and negative ROS elements as per HPI.  All other systems reviewed and negative.      Physical Exam:      Pulse 70   BP 97/42   Resp 18   SpO2 90 %   Temp 98 F (36.7 C)        Constitutional: Awake and alert. No distress.   Head: Normocephalic and atraumatic.   Eyes: Pupils are equal, round, and reactive to light.   ENT: MMM   Neck: Neck supple.   Cardiovascular: Normal rate and regular rhythm.    Pulmonary/Chest: Effort normal and breath sounds normal. No respiratory distress.   Abdominal: Soft. Bowel sounds are normal. There is  no tenderness. No rebound or guarding.  Musculoskeletal: no edema. No calf tenderness.  Neurological: alert and oriented to person, place, and time. No cranial nerve deficit. Motor and sensory function is intact. Cerebellar function intact including finger to nose testing.   Skin: warm and dry. Pt is not diaphoretic.   Psychiatric: normal mood and affect.                 PAST HISTORY        Primary Care Provider: Hazle Coca, MD        PMH/PSH:    .     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 Has hx of recurrent PNA's.    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      Social/Family History:      She reports that she quit smoking about 21 years ago. She has a 60.00 pack-year smoking history. She has never used smokeless tobacco. She reports that she does not drink alcohol and does not use drugs.    Family History   Problem Relation Age of Onset    Heart disease Mother     Heart failure Mother     COPD Mother     Diabetes type II Mother     Heart disease Father     Myocardial Infarction Father     Heart disease Sister     Cardiomyopathy Brother          Listed Medications on Arrival:    .     Home Medications             aspirin EC 81 MG EC tablet     Take 81 mg by mouth daily.     carvedilol (COREG) 25 MG tablet     TAKE 1 TABLET TWICE A DAY  (ADDITIONAL REFILLS FOLLOWING 03/2020 APPOINTMENT)     ferrous sulfate 324 (65 FE) MG Tablet Delayed Response     Take 1 tablet (324 mg total) by mouth every morning with breakfast     fluticasone (FLOVENT DISKUS) 50 MCG/BLIST diskus inhaler     Inhale 250 mcg into the lungs daily.     furosemide (LASIX) 20 MG tablet     Take 1 tablet (20 mg total) by mouth daily With as needed increase to 40mg  if 2+ lb weight gain overnight or increased dysnpea     levalbuterol (XOPENEX HFA) 45 MCG/ACT inhaler     Inhale 1-2 puffs into the lungs every 4 (four) hours as needed.     lisinopril (ZESTRIL) 2.5 MG  tablet     Take 1 tablet (2.5 mg total) by mouth daily     magnesium oxide (MAG-OX) 400 MG tablet     Take 1 tablet (400 mg total) by mouth daily     rosuvastatin (CRESTOR) 20 MG tablet     TAKE 1 TABLET AT BEDTIME (ADDITIONAL REFILLS FOLLOWING 03/2020 APPOINTMENT)     tiotropium (SPIRIVA) 18 MCG inhalation capsule     Place 18 mcg into inhaler and inhale 2 (two) times daily        vitamin B-12 (VITAMIN B12) 500 MCG tablet     Take 1 tablet (500 mcg total) by mouth daily         Allergies: She is allergic to ceclor [cefaclor] and amoxicillin.            VISIT INFORMATION        Clinical Course in the ED:        Systolic blood pressure dropped to 88, fluid responsive.  Suspected to be due to patient being restarted on antihypertensive medication by cardiologist.         Medications Given in the ED:    .     ED Medication Orders (From admission, onward)    Start Ordered     Status Ordering Provider    05/31/20 1700 05/30/20 2147  cefepime (MAXIPIME) 2 g in sodium chloride 0.9 % 100 mL IVPB mini-bag plus  Every 24 hours        Route: Intravenous  Ordered Dose: 2 g     Acknowledged MAJUMDER, ARMAN S    05/31/20 0900 05/30/20 2133  enoxaparin (LOVENOX) syringe 40 mg  Daily        Route: Subcutaneous  Ordered Dose: 40 mg     Acknowledged CHOWDHURY, NIBRAS    05/30/20 2135 05/30/20 2135  acetaminophen (TYLENOL) tablet 650 mg  Every 6 hours PRN        Route: Oral  Ordered Dose: 650 mg     Acknowledged CHOWDHURY, NIBRAS    05/30/20 2133 05/30/20 2133  dextrose (GLUCOSE) 40 % oral gel 15 g of glucose  As needed        Route: Oral  Ordered Dose: 15 g of glucose    "And" Linked Group Details    Acknowledged CHOWDHURY, NIBRAS    05/30/20 2133 05/30/20 2133  dextrose (D10W) 10% bolus 125 mL  As needed        Route: Intravenous  Ordered Dose: 12.5 g    "And" Linked Group Details    Acknowledged CHOWDHURY, NIBRAS    05/30/20 2133 05/30/20 2133  glucagon (rDNA) (GLUCAGEN) injection 1 mg  As needed        Route: Intramuscular  Ordered Dose: 1 mg    "And" Linked Group Details    Acknowledged CHOWDHURY, NIBRAS    05/30/20 2132 05/30/20 2133  melatonin tablet 3 mg  At bedtime PRN        Route: Oral  Ordered Dose: 3 mg     Acknowledged CHOWDHURY, NIBRAS    05/30/20 2132 05/30/20 2133  naloxone (NARCAN) injection 0.2 mg  As needed        Route: Intravenous  Ordered Dose: 0.2 mg     Acknowledged CHOWDHURY, NIBRAS     05/30/20 2132 05/30/20 2133  albuterol-ipratropium (DUO-NEB) 2.5-0.5(3) mg/3 mL nebulizer 3 mL  RT - Every 6 hours as needed        Route: Nebulization  Ordered Dose: 3 mL  Acknowledged CHOWDHURY, NIBRAS    05/30/20 2010 05/30/20 2009    Once        Route: Intravenous  Ordered Dose: 1,000 mL     Discontinued Mignon Pine    05/30/20 1909 05/30/20 1908  sodium chloride 0.9 % bolus 500 mL  Once        Route: Intravenous  Ordered Dose: 500 mL     Last MAR action: New Bag Boyde Grieco, [facility]    05/30/20 1700 05/30/20 1643  cefepime (MAXIPIME) 2 g in sodium chloride 0.9 % 100 mL IVPB mini-bag plus  Once        Route: Intravenous  Ordered Dose: 2 g     Last MAR action: Stopped Yareth Macdonnell    05/30/20 1700 05/30/20 1645  vancomycin (VANCOCIN) 1000 mg in NS 250 mL IVPB vial-mate  Once        Route: Intravenous  Ordered Dose: 1,000 mg     Last [facility] action: [employer], [facility]    05/30/20 1643 05/30/20 1642    Once        Route: Intravenous  Ordered Dose: 2 g     Discontinued Milena Liggett    05/30/20 1636 05/30/20 1635    Once        Route: Intravenous  Ordered Dose: 1,500 mg     Discontinued Mignon Pine    05/30/20 1536 05/30/20 1536  sodium chloride 0.9 % bolus 500 mL  Once        Route: Intravenous  Ordered Dose: 500 mL     Last MAR action: New Bag Shakthi Scipio            Procedures:      Procedures     Critical Care Time(not including procedures): 31 minutes.   Due to the high risk of critical illness or multi-organ failure at initial presentation and/or during ED course.    System(s) at risk for compromise:  circulatory, respiratory, CNS and metabolic  Critical Diagnosis:   1. TIA (transient ischemic attack)    2. HCAP (healthcare-associated pneumonia)    3. Respiratory insufficiency    4. SIRS (systemic inflammatory response syndrome)    5. Leukocytosis, unspecified type         The patient was Hypotensive:   No     The patient was Hypoxic:   Yes   This does not  including time spent performing other reported procedures or services.   Critical care time involved full attention to the patient's condition and included:   Review of nursing notes and/or old charts - Yes  Documentation time - Yes  Care, transfer of care, and discharge plans - Yes  Obtaining necessary history from family, EMS, nursing home staff and/or treating physicians - Yes  Review of medications, allergies, and vital signs - Yes   Consultant collaboration on findings and treatment options - Yes  Ordering, interpreting, and reviewing diagnostic studies/tab tests - Yes         Interpretations:      O2 sat-           saturation: 90 %; Oxygen use: room air; Interpretation: Hypoxic    EKG -             interpreted by me: normal sinus at 71, occ PVC's. Nl axis. No STEMI.   Monitor -         interpreted by me: normal sinus at 70's.         NIH Stroke Score  Most Recent Value   Patient's calculated Stroke Score: 0 filed at 05/30/2020 2200                RESULTS        Lab Results:      Results     Procedure Component Value Units Date/Time    Procalcitonin [[id number]]  (Abnormal) Collected: 05/30/20 1546     Updated: 05/30/20 2351     Procalcitonin 13.05    Creatine Kinase (CK) [[id number]] Collected: 05/30/20 1546    Specimen: Blood Updated: 05/30/20 2214     Creatine Kinase (CK) 46 U/L     Culture Blood Aerobic and Anaerobic [[id number]] Collected: 05/30/20 1645    Specimen: Arm from Blood, Venipuncture Updated: 05/30/20 2049    Narrative:      1 BLUE+1 PURPLE    Culture Blood Aerobic and Anaerobic [[id number]] Collected: 05/30/20 1656    Specimen: Arm from Blood, Venipuncture Updated: 05/30/20 1849    Narrative:      1 BLUE+1 PURPLE    COVID-19 (SARS-CoV-2) and Influenza A/B, NAA [[id number]] Collected: 05/30/20 1645    Specimen: Culturette from Nasopharyngeal Updated: 05/30/20 1758     Purpose of COVID testing Diagnostic -[facility]     SARS-CoV-2 Specimen Source Nasal Swab     SARS CoV 2 Overall Result Not Detected      Influenza A Not Detected     Influenza B Not Detected    Narrative:      o Collect and clearly label specimen type:  o PREFERRED-Upper respiratory specimen: One Nasal Swab in  Transport Media.  o Hand deliver to laboratory ASAP  Diagnostic -[facility]    Lactic Acid [[id number]] Collected: 05/30/20 1645    Specimen: Blood Updated: 05/30/20 1711     Lactic Acid 1.1 mmol/L     Troponin I [[id number]] Collected: 05/30/20 1546    Specimen: Blood Updated: 05/30/20 1636     Troponin I 0.02 ng/mL     Comprehensive metabolic panel [[id number]]  (Abnormal) Collected: 05/30/20 1546    Specimen: Blood Updated: 05/30/20 1635     Glucose 137 mg/dL      BUN 75.6 mg/dL      Creatinine 1.0 mg/dL      Sodium 433 mEq/L      Potassium 4.1 mEq/L      Chloride 95 mEq/L      CO2 28 mEq/L      Calcium 8.6 mg/dL      Protein, Total 6.2 g/dL      Albumin 2.9 g/dL      AST (SGOT) 14 U/L      ALT 9 U/L      Alkaline Phosphatase 97 U/L      Bilirubin, Total 0.9 mg/dL      Globulin 3.3 g/dL      Albumin/Globulin Ratio 0.9     Anion Gap 12.0    Magnesium [[id number]] Collected: 05/30/20 1546    Specimen: Blood Updated: 05/30/20 1635     Magnesium 2.4 mg/dL     GFR [[id number]] Collected: 05/30/20 1546     Updated: 05/30/20 1635     EGFR 53.2    PT/APTT [[id number]] Collected: 05/30/20 1546     Updated: 05/30/20 1623     PT 12.7 sec      PT INR 1.1     PTT 30 sec     CBC and differential [[id number]]  (Abnormal) Collected: 05/30/20 1546    Specimen: Blood Updated: 05/30/20 1608  WBC 21.78 x10 3/uL      Hgb 11.0 g/dL      Hematocrit 16.1 %      Platelets 241 x10 3/uL      RBC 3.87 x10 6/uL      MCV 92.8 fL      MCH 28.4 pg      MCHC 30.6 g/dL      RDW 15 %      MPV 10.0 fL      Neutrophils 85.4 %      Lymphocytes Automated 7.4 %      Monocytes 5.0 %      Eosinophils Automated 0.0 %      Basophils Automated 0.1 %      Immature Granulocytes 2.1 %      Nucleated RBC 0.0 /100 WBC      Neutrophils Absolute 18.60 x10 3/uL      Lymphocytes Absolute Automated 1.61  x10 3/uL      Monocytes Absolute Automated 1.09 x10 3/uL      Eosinophils Absolute Automated 0.00 x10 3/uL      Basophils Absolute Automated 0.03 x10 3/uL      Immature Granulocytes Absolute 0.45 x10 3/uL      Absolute NRBC 0.00 x10 3/uL               Radiology Results:      CT Head WO Contrast   Final Result         No CT evidence of acute intracranial abnormality. Mild global volume   loss and chronic small vessel ischemic changes. MRI would be more   sensitive for detection of acute infarct if clinically indicated.      Gaylyn Rong, MD    05/30/2020 4:49 PM      XR Chest  AP Portable   Final Result     Increasing right basilar airspace disease, stable   left-sided airspace disease.      Geanie Cooley, MD    05/30/2020 4:00 PM                  Scribe Attestation:      No scribe involved in the care of this patient          Mignon Pine, MD  05/31/20 0960       Mignon Pine, MD  05/31/20 4540

## 2020-05-31 LAB — BASIC METABOLIC PANEL
Anion Gap: 8 (ref 5.0–15.0)
BUN: 15 mg/dL (ref 7.0–19.0)
CO2: 26 mEq/L (ref 22–29)
Calcium: 7.7 mg/dL — ABNORMAL LOW (ref 7.9–10.2)
Chloride: 103 mEq/L (ref 100–111)
Creatinine: 0.6 mg/dL (ref 0.6–1.0)
Glucose: 104 mg/dL — ABNORMAL HIGH (ref 70–100)
Potassium: 4 mEq/L (ref 3.5–5.1)
Sodium: 137 mEq/L (ref 136–145)

## 2020-05-31 LAB — VANCOMYCIN, RANDOM: Vancomycin Random: 9 ug/mL

## 2020-05-31 LAB — CBC AND DIFFERENTIAL
Absolute NRBC: 0 10*3/uL (ref 0.00–0.00)
Basophils Absolute Automated: 0.03 10*3/uL (ref 0.00–0.08)
Basophils Automated: 0.2 %
Eosinophils Absolute Automated: 0.01 10*3/uL (ref 0.00–0.44)
Eosinophils Automated: 0.1 %
Hematocrit: 29.1 % — ABNORMAL LOW (ref 34.7–43.7)
Hgb: 8.9 g/dL — ABNORMAL LOW (ref 11.4–14.8)
Immature Granulocytes Absolute: 0.08 10*3/uL — ABNORMAL HIGH (ref 0.00–0.07)
Immature Granulocytes: 0.6 %
Lymphocytes Absolute Automated: 1.23 10*3/uL (ref 0.42–3.22)
Lymphocytes Automated: 9.6 %
MCH: 28.6 pg (ref 25.1–33.5)
MCHC: 30.6 g/dL — ABNORMAL LOW (ref 31.5–35.8)
MCV: 93.6 fL (ref 78.0–96.0)
MPV: 10.3 fL (ref 8.9–12.5)
Monocytes Absolute Automated: 0.76 10*3/uL (ref 0.21–0.85)
Monocytes: 5.9 %
Neutrophils Absolute: 10.67 10*3/uL — ABNORMAL HIGH (ref 1.10–6.33)
Neutrophils: 83.6 %
Nucleated RBC: 0 /100 WBC (ref 0.0–0.0)
Platelets: 186 10*3/uL (ref 142–346)
RBC: 3.11 10*6/uL — ABNORMAL LOW (ref 3.90–5.10)
RDW: 15 % (ref 11–15)
WBC: 12.78 10*3/uL — ABNORMAL HIGH (ref 3.10–9.50)

## 2020-05-31 LAB — ECG 12-LEAD
Atrial Rate: 71 {beats}/min
P Axis: 69 degrees
P-R Interval: 160 ms
Q-T Interval: 432 ms
QRS Duration: 134 ms
QTC Calculation (Bezet): 469 ms
R Axis: -53 degrees
T Axis: 67 degrees
Ventricular Rate: 71 {beats}/min

## 2020-05-31 LAB — URINALYSIS REFLEX TO MICROSCOPIC EXAM - REFLEX TO CULTURE
Bilirubin, UA: NEGATIVE
Blood, UA: NEGATIVE
Glucose, UA: NEGATIVE
Ketones UA: NEGATIVE
Nitrite, UA: NEGATIVE
Protein, UR: 30 — AB
Specific Gravity UA: 1.012 (ref 1.001–1.035)
Urine pH: 6 (ref 5.0–8.0)
Urobilinogen, UA: NORMAL mg/dL (ref 0.2–2.0)

## 2020-05-31 LAB — GFR: EGFR: >60

## 2020-05-31 MED ORDER — COVID-19 MRNA VACCINE (PFIZER) 30 MCG/0.3ML IM SUSP
30.0000 ug | INTRAMUSCULAR | Status: DC | PRN
Start: 2020-05-31 — End: 2020-06-01

## 2020-05-31 MED ORDER — INFLUENZA VAC A&B SA ADJ QUAD 0.5 ML IM PRSY
0.5000 mL | PREFILLED_SYRINGE | Freq: Once | INTRAMUSCULAR | Status: AC
Start: 2020-05-31 — End: 2020-05-31
  Administered 2020-05-31: 19:00:00 0.5 mL via INTRAMUSCULAR
  Filled 2020-05-31: qty 0.5

## 2020-05-31 MED ORDER — BENZOCAINE-MENTHOL 15-3.6 MG MT LOZG
1.0000 | LOZENGE | OROMUCOSAL | Status: DC | PRN
Start: 2020-05-31 — End: 2020-06-01

## 2020-05-31 MED ORDER — LACTATED RINGERS IV BOLUS
500.0000 mL | Freq: Once | INTRAVENOUS | Status: AC
Start: 2020-05-31 — End: 2020-05-31
  Administered 2020-05-31: 500 mL via INTRAVENOUS

## 2020-05-31 MED ORDER — LEVOFLOXACIN 500 MG PO TABS
500.0000 mg | ORAL_TABLET | Freq: Every day | ORAL | Status: DC
Start: 2020-05-31 — End: 2020-06-01
  Administered 2020-05-31 – 2020-06-01 (×2): 500 mg via ORAL
  Filled 2020-05-31 (×2): qty 1

## 2020-05-31 MED ORDER — PREDNISONE 20 MG PO TABS
20.0000 mg | ORAL_TABLET | Freq: Every morning | ORAL | Status: DC
Start: 2020-05-31 — End: 2020-06-01
  Administered 2020-05-31 – 2020-06-01 (×2): 20 mg via ORAL
  Filled 2020-05-31 (×2): qty 1

## 2020-05-31 MED ORDER — ALBUMIN HUMAN/BIOSIMILIAR 5% IV SOLN (WRAP)
25.0000 g | Freq: Once | INTRAVENOUS | Status: AC
Start: 2020-05-31 — End: 2020-05-31
  Administered 2020-05-31: 01:00:00 25 g via INTRAVENOUS
  Filled 2020-05-31: qty 500

## 2020-05-31 MED ORDER — LEVOFLOXACIN 500 MG PO TABS
500.0000 mg | ORAL_TABLET | Freq: Every day | ORAL | 0 refills | Status: AC
Start: 2020-06-01 — End: 2020-06-04

## 2020-05-31 MED ORDER — PREDNISONE 20 MG PO TABS
20.0000 mg | ORAL_TABLET | Freq: Every morning | ORAL | 0 refills | Status: AC
Start: 2020-06-01 — End: 2020-06-04

## 2020-05-31 NOTE — PT Eval Note (Addendum)
[facility]   Physical Therapy Evaluation   Patient: Sabrina Church    MRN#: [id number]   Unit: [facility] TOWER 6 EAST  Bed: F613/F613.01      Post Acute Care Therapy Recommendations:   Discharge Recommendations: Home with home health PT       DME Recommended for Discharge: No additional equipment/DME recommended at this time    If Home with home health PT recommended discharge disposition is not available, patient will need snf     Therapy discharge recommendations may change with patient status.  Please refer to most recent note for up-to-date recommendations.    Assessment:   Significant Findings: O2 sat dropped from 94% on O2 to 72 % w/o O2 post 40 ft walk and went up to 95% w/ O2 re-applied    Sabrina Church is a 80 y.o. female admitted 05/30/2020.  Patient presents with   Impairments: Assessment: Decreased safety/judgement during functional mobility;Decreased endurance/activity tolerance;Impaired motor control;Decreased functional mobility;Decreased balance;Gait impairment.     Pt presents with decreased functional mobility and de-sats with light exertion without O2    Therapy Diagnosis: decreased functional mobility    Rehabilitation Potential: Prognosis: Good;With continued PT status post acute discharge    Treatment Activities: PT eval, ex- LE, walking    Educated the patient to role of physical therapy, plan of care, goals of therapy and safety with mobility and ADLs.    Plan:   Treatment/Interventions: Exercise,Gait training,Neuromuscular re-education     PT Frequency: 3-4x/wk   Risks/Benefits/POC Discussed with Pt/Family: With patient        Precautions and Contraindications:   Weight Bearing Status: no restrictions  Other Precautions: fall, O2 de-sat on RA    Consult received for Sabrina Church for PT Evaluation and Treatment.  Patients medical condition is appropriate for Physical therapy intervention at this time.    Medical Diagnosis: TIA (transient ischemic attack)  [G45.9]  HCAP (healthcare-associated pneumonia) [J18.9]      History of Present Illness:   Sabrina Church is a 80 y.o. female admitted on 05/30/2020 with w/pmhx of asthma, COPD, pulm HTN and fibrosis who presents here due to 1 day hx of noted worsening cough, SOB, as well as confusion and difficulty with speech (which has improved). Pt reports that shortness of breath is better today.       Past Medical/Surgical History:  Past Medical History:   Diagnosis Date    Arrhythmia-PSVT     Arrhythmia-PVCs     Asthma     Broken heart syndrome 09-29-11    s/p death of spouse    Carotid artery disease     COPD (chronic obstructive pulmonary disease)     Used to follow w/ [facility] for COPD/Asthma. Has hx of recurrent PNA's.    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 Mild global volume   loss and chronic small vessel ischemic changes. MRI would be more   sensitive for detection of acute infarct if clinically indicated.   XR Chest AP Portable [[id number]]Collected: 05/30/20 1557  Order Status: Completed   Impression:     Increasing right basilar airspace disease, stable   left-sided airspace disease.     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 Nursing clears patient for therapy.     Patient Goal: home    Pain Assessment  Pain Assessment: No/denies pain    Objective:     Pt wore mask during therapy session:Yes    Observation of Patient/Vital signs:  Inspection/Posture: O2, iv access, ext. catheter    Cognition/Neuro Status  Arousal/Alertness: Appropriate responses to stimuli  Attention Span: Attends to task with redirection  Following Commands: Follows one step commands with repetition  Safety Awareness: minimal verbal instruction  Insights: Decreased awareness of deficits    Musculoskeletal Examination:  Gross ROM  Right Upper Extremity ROM: within functional limits  Left Upper Extremity ROM: within functional limits  Right Lower Extremity ROM: within functional limits  Left Lower Extremity ROM: within functional limits    Gross Strength  Right Upper Extremity Strength: 4/5  Left Upper Extremity Strength: 4/5  Right Lower Extremity Strength: 4/5  Left Lower Extremity Strength: 4/5     sensation - intact    Functional Mobility:  Supine to Sit: Supervision  Scooting to EOB: Supervision  Sit to Supine: Supervision  Sit to Stand: Contact Guard Assist;with instruction for hand placement to increase safety (to rw)  Stand to Sit: Contact Guard Assist         Ambulation:  PMP - Progressive Mobility Protocol   PMP Activity: Step 7 - Walks out of Room  Distance Walked (ft) (Step 6,7): 40 Feet     Ambulation: Contact Guard Assist;with front-wheeled walker  Pattern:  (fairly stable w/ rW)     Balance:  Balance:  (sitting- good;  standing- w/ rw- sba)         Participation and Activity Tolerance:  Participation Effort: good  Endurance: Tolerates < 10 min exercise with changes in vital signs (O2 sat to 72 % post 40 ft walk- up to 95% post putting on O2)      Patient left with call bell within reach, all needs met, SCDs na, fall mat down, bed alarm on, chair alarm na and all questions answered. RN notified of session outcome and patient response.       Goals:   Goals  Goal Formulation: With patient  Time  for Goal Acheivement: 5 visits  Goals: Select goal  Pt Will Perform Sit to Stand:  (2/2 times w/ sba w/o device w/o no LOB)  Pt Will Ambulate: 101-150 feet,with rolling walker,with supervision (or without device)       PPE worn during session: procedural mask    Tech present: no  PPE worn by tech: N/A        Time of treatment:   PT Received On: 05/31/20  Start Time: 0740  Stop Time: 0820  Time Calculation (min): 40 min         Delcie Roch, PT;  pgr [phone]

## 2020-05-31 NOTE — Progress Notes (Signed)
MEDICINE PROGRESS NOTE    Date Time: 05/31/20 7:44 AM  Patient Name: Sabrina Church  Attending Physician: Barbette Hair, MD    Assessment:   Sabrina Church is an 80 year old woman with a PMH notable for COPD, carotid artery disease, and urge incontinence who presented to the ED last night with AMS 2/2 toxic metabolic encephalopathy, now resolved and sepsis 2/2 PNA vs less likely UTI     Plan:   #Sepsis present on admission 2/2 PNA  vs less likely UTI   UTI appears to be resolved (no symptoms, no pyuria, trace LE) but was pan susceptible on previous cultures   Drip score 4  Hx of COPD  - Re administer 500 mL albumin if MAP <65  - PO 500mg  levofloxin qdaily for 5 days to cover for atypicals and pseudomonas. Plan to initiate while in hospital to monitor for side effects before discharge    #COPD exacerbation  - Initiate 20mg  oral predisone for 5 days   - Continue Breo 200-248mcg  - Continue Spiriva 2.30mcg    #AMS  - Acute toxic metabolic encephalopathy likely 2/2 infection - resolved    #AHRF  On 2L O2 baseline at home  - Ambulatory O2 to check for current oxygen needs  - PNA treatment as above  - Maintain O2 sat 88-90 in setting of COPD       Case discussed with: Dr. Ashley Jacobs     Safety Checklist:     DVT prophylaxis:  CHEST guideline (See page [id number]) Chemical   Foley:  [place] Rn Foley protocol Not present   IVs:  Peripheral IV   PT/OT: Ordered   Daily CBC & or Chem ordered:  SHM/ABIM guidelines (see #5) Yes, due to clinical and lab instability   Reference for approximate charges of common labs: CBC auto diff - $76   BMP - $99   Mg - $79    Lines:     Patient Lines/Drains/Airways Status     Active PICC Line / CVC Line / PIV Line / Drain / Airway / Intraosseous Line / Epidural Line / ART Line / Line / Wound / Pressure Ulcer / NG/OG Tube     Name Placement date Placement time Site Days    Peripheral IV 05/30/20 18 G Right Antecubital 05/30/20    Antecubital  1                 Disposition: (Please see PAF column for Expected  D/C Date)   Today's date: 05/31/2020   Admit Date: 05/30/2020  3:13 PM   LOS: 1  Clinical Milestones: admitted and AMS resolved  Anticipated discharge needs: tbd       Subjective     CC: AMS and sepsis   Interval History/24 hour events: admitted     HPI/Subjective: Sabrina Church was brought to the ED by her son who was called by her home aids after they noticed she seemed confused. On prior admission 1/12 she had been found to have infiltrates on CXR and CT which were not addressed in the setting of her acute concern for rhabdomyolysis. She also on previous admission had been found to have a urine culture positive for E Coli that was not cultured until after she was discharged.    She states that she has had issues catching pneumonia since she was a baby and her current COPD also makes her more susceptible, but usually she is able to "fight it off." She feels sick  with her cough and weak from just laying in bed, but does feel better than yesterday. She is excited to go home when she is ready. She denies chest pain and dizziness.   Review of Systems:     As per HPI    Physical Exam:     VITAL SIGNS PHYSICAL EXAM   Temp:  [97.8 F (36.6 C)-98.5 F (36.9 C)] 97.9 F (36.6 C)  Heart Rate:  [66-111] 75  Resp Rate:  [17-30] 18  BP: (84-113)/(6-53) 94/52  Blood Glucose:    Telemetry: sinus rhythm with PVTs      Intake/Output Summary (Last 24 hours) at 05/31/2020 0744  Last data filed at 05/30/2020 1946  Gross per 24 hour   Intake 100 ml   Output    Net 100 ml    Physical Exam  General: awake, alert X 3. NAD  Cardiovascular: irregular rate, normal S1/S2 no murmurs rubs or gallops  Abdomen: soft, non-tender, non-distended; no palpable masses  Pulmonary: bilateral inspiratory crackles in all lung fields, cough with bronchospasm   Extremities: no edema, large bruise on right calf        Meds:     Medications were reviewed:  Current Facility-Administered Medications   Medication Dose Route Frequency    aspirin  81 mg Oral Daily     cefepime  2 g Intravenous Q24H    enoxaparin  40 mg Subcutaneous Daily    fluticasone furoate-vilanterol  1 puff Inhalation QAM    metoprolol tartrate  12.5 mg Oral Q12H SCH    rosuvastatin  20 mg Oral QHS    tiotropium  2 puff Inhalation QAM     Current Facility-Administered Medications   Medication Dose Route Frequency Last Rate     Current Facility-Administered Medications   Medication Dose Route    acetaminophen  650 mg Oral    albuterol-ipratropium  3 mL Nebulization    dextrose  15 g of glucose Oral    And    dextrose  12.5 g Intravenous    And    glucagon (rDNA)  1 mg Intramuscular    melatonin  3 mg Oral    naloxone  0.2 mg Intravenous         Labs:     Labs (last 72 hours):    Recent Labs   Lab 05/31/20  0400 05/30/20  1546   WBC 12.78* 21.78*   Hgb 8.9* 11.0*   Hematocrit 29.1* 35.9   Platelets 186 241       Recent Labs   Lab 05/30/20  1546   PT 12.7   PT INR 1.1   PTT 30    Recent Labs   Lab 05/31/20  0400 05/30/20  1546   Sodium 137 135*   Potassium 4.0 4.1   Chloride 103 95*   CO2 26 28   BUN 15.0 28.0*   Creatinine 0.6 1.0   Calcium 7.7* 8.6   Albumin  --  2.9*   Protein, Total  --  6.2   Bilirubin, Total  --  0.9   Alkaline Phosphatase  --  97   ALT  --  9   AST (SGOT)  --  14   Glucose 104* 137*                   Microbiology, reviewed and are significant for:  Microbiology Results (last 15 days)     Procedure Component Value Units Date/Time    Legionella antigen, urine [  [id number]] Collected: 05/31/20 0022    Order Status: Sent Specimen: Urine, Clean Catch Updated: 05/31/20 0606    S. PNEUMONIAE, RAPID URINARY ANTIGEN [[id number]] Collected: 05/31/20 0022    Order Status: Sent Specimen: Urine, Clean Catch Updated: 05/31/20 0606    Culture Blood Aerobic and Anaerobic [[id number]] Collected: 05/30/20 1656    Order Status: Sent Specimen: Arm from Blood, Venipuncture Updated: 05/30/20 1849    Narrative:      1 BLUE+1 PURPLE    COVID-19 (SARS-CoV-2) and Influenza A/B, NAA [[id number]]  Collected: 05/30/20 1645    Order Status: Completed Specimen: Culturette from Nasopharyngeal Updated: 05/30/20 1758     Purpose of COVID testing Diagnostic -PUI     SARS-CoV-2 Specimen Source Nasal Swab     SARS CoV 2 Overall Result Not Detected     Comment: __________________________________________________  -A result of "Detected" indicates POSITIVE for the    presence of SARS CoV-2 RNA  -A result of "Not Detected" indicates NEGATIVE for the    presence of SARS CoV-2 RNA  __________________________________________________________  Test performed using the Roche cobas Liat SARS-CoV-2 assay. This assay is  only for use under the Food and Drug Administrations Emergency Use  Authorization. This is a real-time RT-PCR assay for the qualitative  detection of SARS-CoV-2 RNA. Viral nucleic acids may persist in vivo,  independent of viability. Detection of viral nucleic acid does not imply the  presence of infectious virus, or that virus nucleic acid is the cause of  clinical symptoms. Negative results do not preclude SARS-CoV-2 infection and  should not be used as the sole basis for diagnosis, treatment or other  patient management decisions. Negative results must be combined with  clinical observations, patient history, and/or epidemiological information.  Invalid results may be due to inhibiting substances in the specimen and  recollection should occur. Please see Fact Sheets for patients and providers  located:  WirelessDSLBlog.no          Influenza A Not Detected     Influenza B Not Detected     Comment: Test performed using the Roche cobas Liat SARS-CoV-2 & Influenza A/B assay.  This assay is only for use under the Food and Drug Administrations  Emergency Use Authorization. This is a multiplex real-time RT-PCR assay  intended for the simultaneous in vitro qualitative detection and  differentiation of SARS-CoV-2, influenza A, and influenza B virus RNA. Viral  nucleic acids may persist in  vivo, independent of viability. Detection of  viral nucleic acid does not imply the presence of infectious virus, or that  virus nucleic acid is the cause of clinical symptoms. Negative results do  not preclude SARS-CoV-2, influenza A, and/or influenza B infection and  should not be used as the sole basis for diagnosis, treatment or other  patient management decisions. Negative results must be combined with  clinical observations, patient history, and/or epidemiological information.  Invalid results may be due to inhibiting substances in the specimen and  recollection should occur. Please see Fact Sheets for patients and providers  located: http://www.rice.biz/.         Narrative:      o Collect and clearly label specimen type:  o PREFERRED-Upper respiratory specimen: One Nasal Swab in  Transport Media.  o Hand deliver to laboratory ASAP  Diagnostic -PUI    Culture Blood Aerobic and Anaerobic [[id number]] Collected: 05/30/20 1645    Order Status: Sent Specimen: Arm from Blood, Venipuncture Updated: 05/30/20 2049    Narrative:  1 BLUE+1 PURPLE          Imaging, reviewed and are significant for:  CT head without contrast noted chronic small ischemic changes and mild global volume loss without acute hemorrhage or infarct   AP CXR: obscured pulmonary vascular pattern, diffuse multifocal and interstitial opacities R >L, R side increased from prior CXR 1/12, no effusion or pneumothorax       Signed by: Cephus Slater, BS    I was present with the medical student while the E/M service was performed. I have performed (or re-performed) the physical exam and/or medical decision making documented by the student. I agree with and have verified that the student documentation is accurate, or have edited the note.      Carney Bern, MD

## 2020-05-31 NOTE — Plan of Care (Signed)
Problem: Safety  Goal: Patient will be free from injury during hospitalization  Outcome: Progressing  Flowsheets (Taken 05/31/2020 0116)  Patient will be free from injury during hospitalization:   Assess patient's risk for falls and implement fall prevention plan of care per policy   Provide and maintain safe environment   Use appropriate transfer methods   Ensure appropriate safety devices are available at the bedside   Include patient/ family/ care giver in decisions related to safety   Hourly rounding  Goal: Patient will be free from infection during hospitalization  Outcome: Progressing  Flowsheets (Taken 05/31/2020 0116)  Free from Infection during hospitalization:   Assess and monitor for signs and symptoms of infection   Monitor lab/diagnostic results   Monitor all insertion sites (i.e. indwelling lines, tubes, urinary catheters, and drains)   Encourage patient and family to use good hand hygiene technique     Problem: Pain  Goal: Pain at adequate level as identified by patient  Outcome: Progressing  Flowsheets (Taken 05/31/2020 0116)  Pain at adequate level as identified by patient:   Identify patient comfort function goal   Assess pain on admission, during daily assessment and/or before any "as needed" intervention(s)   Reassess pain within 30-60 minutes of any procedure/intervention, per Pain Assessment, Intervention, Reassessment (AIR) Cycle   Evaluate if patient comfort function goal is met   Offer non-pharmacological pain management interventions   Consult/collaborate with Physical Therapy, Occupational Therapy, and/or Speech Therapy   Include patient/patient care companion in decisions related to pain management as needed     Problem: Psychosocial and Spiritual Needs  Goal: Demonstrates ability to cope with hospitalization/illness  Outcome: Progressing  Flowsheets (Taken 05/31/2020 0116)  Demonstrates ability to cope with hospitalizations/illness:   Encourage verbalization of  feelings/concerns/expectations   Provide quiet environment   Assist patient to identify own strengths and abilities   Encourage patient to set small goals for self   Encourage participation in diversional activity   Include patient/ patient care companion in decisions     Problem: Moderate/High Fall Risk Score >5  Goal: Patient will remain free of falls  Outcome: Progressing  Flowsheets (Taken 05/31/2020 0000)  High (Greater than 13):   HIGH-Consider use of low bed   HIGH-Initiate use of floor mats as appropriate   HIGH-Pharmacy to initiate evaluation and intervention per protocol   HIGH-Apply yellow "Fall Risk" arm band   HIGH-Bed alarm on at all times while patient in bed   MOD-Place Fall Risk level on whiteboard in room   MOD-Include family in multidisciplinary POC discussions   MOD-Request PT/OT consult order for patients with gait/mobility impairment   MOD-Perform dangle, stand, walk (DSW) prior to mobilization   MOD-Re-orient confused patients   MOD-Use of assistive devices -Bedside Commode if appropriate   MOD-Remain with patient during toileting   MOD-Use of chair-pad alarm when appropriate   LOW-Fall Interventions Appropriate for Low Fall Risk     Problem: Neurological Deficit  Goal: Neurological status is stable or improving  Outcome: Progressing  Flowsheets (Taken 05/31/2020 0116)  Neurological status is stable or improving:   Monitor/assess/document neurological assessment (Stroke: every 4 hours)   Monitor/assess NIH Stroke Scale   Re-assess NIH Stroke Scale for any change in status   Observe for seizure activity and initiate seizure precautions if indicated   Perform CAM Assessment     Problem: Potential for Aspiration  Goal: Risk of aspiration will be minimized  Outcome: Progressing  Flowsheets (Taken 05/31/2020 0116)  Risk of  aspiration will be minimized:   Dysphagia screen: Keep patient NPO if patient fails screening   Assess and monitor ability to swallow   Monitor/assess for  signs of aspiration (tachypnea, cough, wheezing, clearing throat, hoarseness after eating, decrease in SaO2)   Place patient up in chair to eat, if possible   Supervise patient during oral intake   Head of bed up 90 degrees to eat if unable to be out of bed   Consult/collaborate with Speech Pathologist for dysphagia     Problem: Compromised Hemodynamic Status  Goal: Vital signs and fluid balance maintained/improved  Outcome: Progressing  Flowsheets (Taken 05/31/2020 0116)  Vital signs and fluid balance are maintained/improved:   Position patient for maximum circulation/cardiac output   Monitor/assess vitals and hemodynamic parameters with position changes   Monitor intake and output. Notify LIP if urine output is less than 30 mL/hour.   Monitor/assess lab values and report abnormal values     Problem: Impaired Mobility  Goal: Mobility/Activity is maintained at optimal level for patient  Outcome: Progressing  Flowsheets (Taken 05/31/2020 0116)  Mobility/activity is maintained at optimal level for patient:   Increase mobility as tolerated/progressive mobility   Encourage independent activity per ability   Maintain proper body alignment   Perform active/passive ROM   Reposition patient every 2 hours and as needed unless able to reposition self   Assess for changes in respiratory status, level of consciousness and/or development of fatigue   Consult/collaborate with Physical Therapy and/or Occupational Therapy     Problem: Nutrition  Goal: Nutritional intake is adequate  Outcome: Progressing  Flowsheets (Taken 05/31/2020 0116)  Nutritional intake is adequate:   Assist patient with meals/food selection   Allow adequate time for meals   Encourage/perform oral hygiene as appropriate     Problem: Anxiety  Goal: Anxiety is at a manageable level  Outcome: Progressing  Flowsheets (Taken 05/31/2020 0116)  Anxiety is at a manageable level:   Orient to unit   Inform/explain to patient/patient care companion all  tests/procedures/treatment/care prior to initiation   Facilitate expression of feelings, fears, concerns, anxiety   Assess emotional status and coping mechanisms   Provide and maintain a safe environment   Include patient/patient care companion in decisions related to anxiety/depression   Encourage participation in care   Provide emotional support     NURSING PROGRESS NOTE: STROKE UNIT    Patient Name: Sabrina Church (80 y.o. female)  Admission Date: 05/30/2020 [facility] Day 1)      Recent Labs   Lab 05/30/20  1546   Sodium 135*   Potassium 4.1   Chloride 95*   CO2 28   BUN 28.0*   Creatinine 1.0   EGFR 53.2   Glucose 137*   Calcium 8.6       Recent Labs   Lab 05/30/20  1546   WBC 21.78*   Hgb 11.0*   Hematocrit 35.9   Platelets 241         Patient Lines/Drains/Airways Status     Active Lines, Drains and Airways     Name Placement date Placement time Site Days    Peripheral IV 05/30/20 18 G Right Antecubital 05/30/20    Antecubital  1                   Braden Scale Score: 18 (05/30/20 2213)      Skin Integrity: Bruising,Other(Comment)  Bruising Skin Location: scattered         Safety Checklist  1:1 Sitter n    Avasys n     ORAL CARE  y     Recruitment consultant Services:  Does the patient require an Interpreter? no    If yes, what form of interpreter services was used?    If family was utilized, is interpreter waiver form signed and in the chart?       ASSESSMENT/PLAN:    Last BM: pta    Pending Orders: monitor BP, labs in am, possible cardio/pulmonology consult , on IV cefepime    Discharge Plan: tbd    Social/Family visits: none    POC Update: patient    Shift Note: Patient is admitted to the unit around 2200 from ED. A/ox 3-4, fc, clear speech. MAE-gen weakness, normal power throughout. Sensation intact. On 2L oxygen through n/c, sating 96-97%; has spontaneous cough, denies any SOB. On telemetry- in & out Afib, PVcs- MD made aware (said to hold pt's BP medicine at this time as pt running soft  BP). Ordered low dose metoprolol for heart rate, unable to give due to low BP. So, LR 500 ml bolus and albumin 250 mg given as ordered for low BP. Will continue to monitor.   -On heart healthy diet, tolerating pills whole with water (passed bedside swallowing). Denies any pain/discomfort. External cath in place. Fall/safety precaution enforced, will continue to monitor closely with purposeful rounding.  -Skin intact on assessment- scattered bruises noted on extremities.    -UA done on the shift.      PPE worn in patient's room: facemask, gloves

## 2020-05-31 NOTE — Discharge Instr - AVS First Page (Addendum)
Reason for your Hospital Admission:    Sabrina Church, you presented to the hospital and were found to have findings concerning for pneumonia and a COPD exacerbation. We started you on intravenous antibiotics and you improved.      Instructions for after your discharge:    Please follow up with your primary care provider and pulmonologist. Please continue to use oxygen at night; based on our in-hospital evaluation you do not require oxygen during the day.    Medications:  Please take Levaquin (levofloxacin) 500 mg once a day through 06/04/2020.  Please take prednisone 20 mg once a day through 06/04/2020.    The remainder of your medications are unchanged.

## 2020-05-31 NOTE — SLP Progress Note (Signed)
[facility]   Speech Therapy Cancellation Note      Patient:  Loa Idler Soth MRN#:  [id number]  Unit:  [facility] TOWER 6 EAST Room/Bed:  V409/W119.14    05/31/2020  Time: 10:31      Patient not seen for speech therapy secondary to not indicated. Pt passed RN dysphagia screen and is currently on regular diet/thin liquids. RN reports pt is tolerating diet. SLP messaged with resident; agreed to cancel swallow orders. If any changes noted, please feel free to re-consult. --Mary Sella, M.A. CCC-SLP #[id number], 05/31/2020

## 2020-05-31 NOTE — OT Eval Note (Signed)
[facility]   Occupational Therapy Evaluation     Patient: Sabrina Church    MRN#: [id number]   Unit: [facility] TOWER 6 EAST  Bed: F613/F613.01                                     Post Acute Care Therapy Recommendations:   Discharge Recommendations: Home with supervision and assist with higher level ADLs as needed    Milestones to be reached to achieve recommendation: none   Anticipate achievement in N/A sessions    DME Recommended for Discharge: Tub transfer bench      Therapy discharge recommendations may change with patient status.  Please refer to most recent note for up-to-date recommendations.    Assessment:   Significant Findings: None, O2 sats stable at 94% after activity on 3L    Sabrina Church is a 80 y.o. female admitted 05/30/2020.  Patient presents with decreased endurance and safety, however able to complete functional mobility, toileting, and standing grooming with SBA using no AD. Patient lives alone and would benefit from practice with safety with O2 cord and energy conservation, reporting she is independent with ADLs at baseline. Patient is performing below functional baseline and will benefit from acute OT services to maximize independence and safety with ADLs and functional mobility.    Therapy Diagnosis: decreased endurance    Rehabilitation Potential:   good with home support    Treatment Activities: OT evaluation, ADL retraining  Educated the patient to role of occupational therapy, plan of care, goals of therapy and safety with mobility and ADLs, energy conservation techniques.    Plan:   OT Frequency Recommended: 3-4x/wk      ADL training, functional transfer training, modified bed mobility training, education on energy conservation/fall prevention techniques, compensatory education, UE strengthening, endurance training, relaxation/pain management techniques, education on medical equipment beneficial for increasing ADL performance and safety in home  environment.    Risks/benefits/POC discussed with patient         Precautions and Contraindications:   Precautions  Weight Bearing Status: no restrictions  Other Precautions: falls, monitor O2    Consult received for Sabrina Church for OT Evaluation and Treatment.  Patient's medical condition is appropriate for Occupational Therapy intervention at this time.    Admitting Diagnosis: TIA (transient ischemic attack) [G45.9]  HCAP (healthcare-associated pneumonia) [J18.9]      History of Present Illness:    Sabrina Church is a 80 y.o. female admitted on 05/30/2020 with confusion, cough, SOB.    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 Has hx of recurrent PNA's.    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 Mild global volume loss and chronic small vessel ischemic changes. MRI would be more sensitive for detection of acute infarct if clinically indicated. Gaylyn Rong, MD  05/30/2020 4:49 PM    CT Chest WO Contrast    Result Date:  05/24/2020   New multifocal bilateral pulmonary opacities possibly infectious. Follow-up imaging is recommended to document resolution. Rocky Crafts, MD  05/24/2020 11:21 AM    XR Chest  AP Portable    Result Date: 05/30/2020    Increasing right basilar airspace disease, stable left-sided airspace disease. Geanie Cooley, MD  05/30/2020 4:00 PM    XR Chest  AP Portable    Result Date: 05/15/2020   Asymmetric left greater than right-sided infiltrates versus edema and perhaps effusions. Recommend follow-up to ensure clearing. Prince Solian, MD  05/15/2020 6:47 PM       Social History:   Prior Level of Function:  Prior level of function: Independent with ADLs,Ambulates independently  Baseline Activity Level: Household ambulation  Driving: does not drive  DME Currently at Home: Dan Humphreys, [employer]- [employer]    Home Living Arrangements:  Living Arrangements: Alone  Home Layout: Able to live on main level with bedroom/bathroom  DME Currently at Home: Dan Humphreys, [employer]- [employer]  Home Living - Notes / Comments: Reports friend can assist as needed, son lives in [place], baseline home O2 2L for sleeping    Subjective: So I can't get up by myself? RE: Patient received sitting EOB with bed alarm going off     Patient is agreeable to participation in the therapy session.     Patient goal: to go to the bathroom    Pain Assessment  Pain Assessment: No/denies pain    Objective:   Inspection/Posture  Inspection/Posture: O2, iv access, ext. catheter    Observation of Patient/Vital Signs:  Patient is in bed with IV access and telemetry in place.  Pt wore mask during therapy session:No      Cognitive Status and Neuro Exam:  Cognition/Neuro Status  Arousal/Alertness: Appropriate responses to stimuli  Attention Span: Appears intact  Following Commands: independent  Safety Awareness: minimal verbal instruction  Insights: Educated in Engineer, building services;Fully aware of deficits  Behavior: calm;cooperative    Neuro  Status  Behavior: calm;cooperative         Musculoskeletal Examination  Gross ROM  Gross ROM: within functional limits    Gross Strength  Right Upper Extremity Strength: within functional limits  Left Upper Extremity Strength: within functional limits  Right Lower Extremity Strength: within functional limits  Left Lower Extremity Strength: within functional limits         Tone  Tone: within functional limits    Sensory/Oculomotor Examination  Sensory  Auditory: intact  Tactile - Light Touch: intact  Visual Acuity: intact         Activities of Daily Living  Self-care and Home Management  Eating: Independent (hand to mouth)  Grooming: Stand by Assist;standing at sink  Bathing: Supervision;edge of bed  UB Dressing: Supervision  LB Dressing: Supervision  Toileting: Supervision  Functional Transfers: Stand by Assist (no AD)    Functional Mobility:  Mobility and Transfers  Supine to Sit: Supervision  Sit to Supine: Supervision  Sit to Stand: Stand by Assist  Functional Mobility/Ambulation: Stand by Assist (no AD)     PMP Activity: Step 6 - Walks in Room     Balance  Balance  Static Sitting Balance: good  Dyanamic Sitting Balance: good  Static Standing Balance: good  Dynamic Standing Balance: good    Participation and Activity Tolerance  Participation and Endurance  Participation Effort: good  Endurance: Endurance does not limit participation in activity    Patient left with call bell within reach, all needs met, SCDs off, fall mat on, bed alarm on, chair alarm off and all questions answered. RN notified of session outcome and patient response.     Goals:  Time For Goal Achievement: 3 visits  ADL Goals  Patient will groom self: Supervision  Patient will toilet: Supervision  Mobility and Transfer Goals  Pt will perform functional transfers: Supervision        Executive Fucntion Goals  Pt will demonstrate safety: with supervision,to increase ability to complete ADLs  Pt will follow energy conservation techniques: with  supervision,with demonstration of 2 ECT while performing ADLs/IADLs                PPE worn during session: procedural mask, goggles and gloves    Tech present: No   PPE worn by tech: N/A    Time of treatment:   OT Received On: 05/31/20  Start Time: 1045  Stop Time: 1110  Time Calculation (min): 25 min    Juanetta Beets, OTR/L  Pager [phone]

## 2020-05-31 NOTE — Progress Notes (Signed)
Attending Attestation:     I have seen and personally examined the patient.  I agree with the history, exam, assessment, and management plans as documented by the resident.  I concur with or have edited all elements of the provider's note, with any caveats as follows    Hospital course:  This is an 80 year old female who presented after several days of cough and fatigue noted to be confused briefly at home.  She was found to have sepsis due to urinary tract infection and pneumonia with acute on chronic hypoxic respiratory failure.  Given her elevated risk score she was started on vancomycin and cefepime which has now been deescalated to Levaquin.  On her previous admission she had a urine culture that was positive for E. coli January 12 which was never treated.  Chest x-ray shows recently seen bilateral opacities that are worse unilaterally.  She may also have emphysematous exacerbation will start her on low-dose steroids.  Like to titrate her off oxygen and see how she responds to both the quinolone and steroids both put her at risk for altered mental status.  She tolerates this regimen we will be able to discharge her tomorrow.    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 fib-not on anticoagulation  Acute toxic metabolic encephalopathy now resolved  Hypotension-resolved  Sepsis (present on admission) secondary to pneumonia and urinary tract infection (urine culture growing E. coli from January 12)  Healthcare associated pneumonia-drip score elevated at 4    Barbette Hair, MD

## 2020-05-31 NOTE — UM Notes (Signed)
Order [id number]    05/30/20 2009  Adult Admit to Inpatient ([facility] Only)  Once               Initial Review  Class: Inpatient    Summary:  The patient is an 80 year old female with a history of Takotsubo (2013), HFpEF (G2DD), COPD, on home O2 of 2L, TIA, and urinary incontinence who presented to the hospital with confusion.  She was found to have hypotension with leukocytosis concerning for SIRS secondary to UTI vs PNA.    PSI Score: 121 Class: IV  CURB-65 Score: 4      Vitals:      Weight: 60.8 kg      Labs:  Procalcitonin: 13.05  BUN: 28.0  Sodium: 135  Chloride: 95  Albumin: 2.9  WbC: 21.78  Hgb: 11.0      Imaging:  X-Ray Chest:  Increasing right basilar airspace disease, stable   left-sided airspace disease.       Medications:  Cefepime 2 grams IV x once  Sodium Chloride 0.9% 500 ml IV bolus x 2  Vancomycin 1,000 mg IV x once      Plan of Care:  -  CT head  -  X-ray chest  -  IV antibiotics  -  Blood culture  -  Urine culture  -  Pulmonary consult  -  Supplemental oxygen    Doree Albee RN, BSN  UR Case Manager  [facility]  [email or web address]  12:36 PM  05/31/2020

## 2020-06-01 LAB — CBC AND DIFFERENTIAL
Absolute NRBC: 0 10*3/uL (ref 0.00–0.00)
Basophils Absolute Automated: 0.01 10*3/uL (ref 0.00–0.08)
Basophils Automated: 0.1 %
Eosinophils Absolute Automated: 0 10*3/uL (ref 0.00–0.44)
Eosinophils Automated: 0 %
Hematocrit: 32.1 % — ABNORMAL LOW (ref 34.7–43.7)
Hgb: 9.7 g/dL — ABNORMAL LOW (ref 11.4–14.8)
Immature Granulocytes Absolute: 0.06 10*3/uL (ref 0.00–0.07)
Immature Granulocytes: 0.9 %
Lymphocytes Absolute Automated: 1 10*3/uL (ref 0.42–3.22)
Lymphocytes Automated: 14.5 %
MCH: 29 pg (ref 25.1–33.5)
MCHC: 30.2 g/dL — ABNORMAL LOW (ref 31.5–35.8)
MCV: 95.8 fL (ref 78.0–96.0)
MPV: 10.9 fL (ref 8.9–12.5)
Monocytes Absolute Automated: 0.61 10*3/uL (ref 0.21–0.85)
Monocytes: 8.8 %
Neutrophils Absolute: 5.24 10*3/uL (ref 1.10–6.33)
Neutrophils: 75.7 %
Nucleated RBC: 0 /100 WBC (ref 0.0–0.0)
Platelets: 184 10*3/uL (ref 142–346)
RBC: 3.35 10*6/uL — ABNORMAL LOW (ref 3.90–5.10)
RDW: 14 % (ref 11–15)
WBC: 6.92 10*3/uL (ref 3.10–9.50)

## 2020-06-01 LAB — BASIC METABOLIC PANEL
Anion Gap: 6 (ref 5.0–15.0)
BUN: 9 mg/dL (ref 7.0–19.0)
CO2: 29 mEq/L (ref 22–29)
Calcium: 8.7 mg/dL (ref 7.9–10.2)
Chloride: 103 mEq/L (ref 100–111)
Creatinine: 0.6 mg/dL (ref 0.6–1.0)
Glucose: 114 mg/dL — ABNORMAL HIGH (ref 70–100)
Potassium: 4.5 mEq/L (ref 3.5–5.1)
Sodium: 138 mEq/L (ref 136–145)

## 2020-06-01 LAB — GFR: EGFR: >60

## 2020-06-01 NOTE — Plan of Care (Signed)
Problem: Safety  Goal: Patient will be free from injury during hospitalization  Outcome: Progressing  Flowsheets (Taken 06/01/2020 1219)  Patient will be free from injury during hospitalization:   Assess patient's risk for falls and implement fall prevention plan of care per policy   Provide and maintain safe environment     Problem: Moderate/High Fall Risk Score >5  Goal: Patient will remain free of falls  Outcome: Progressing  Flowsheets (Taken 06/01/2020 1219)  Moderate Risk (6-13):   MOD-Remain with patient during toileting   MOD-Re-orient confused patients   MOD-Floor mat at bedside (where available) if appropriate  High (Greater than 13): MOD-Remain with patient during toileting     Problem: Compromised Hemodynamic Status  Goal: Vital signs and fluid balance maintained/improved  Outcome: Progressing  Flowsheets (Taken 06/01/2020 1219)  Vital signs and fluid balance are maintained/improved:   Monitor/assess vitals and hemodynamic parameters with position changes   Monitor/assess lab values and report abnormal values  NURSING PROGRESS NOTE: STROKE UNIT    Patient Name: Sabrina Church (80 y.o. female)  Admission Date: 05/30/2020 [facility] Day 2)      Recent Labs   Lab 06/01/20  0334   Sodium 138   Potassium 4.5   Chloride 103   CO2 29   BUN 9.0   Creatinine 0.6   EGFR >60.0   Glucose 114*   Calcium 8.7       Recent Labs   Lab 06/01/20  0334   WBC 6.92   Hgb 9.7*   Hematocrit 32.1*   Platelets 184         Patient Lines/Drains/Airways Status       Active Lines, Drains and Airways       Name Placement date Placement time Site Days    Peripheral IV 05/30/20 18 G Right Antecubital 05/30/20  --  Antecubital  2                       Braden Scale Score: 22 (06/01/20 0800)      Skin Integrity: Abrasion,Bruising  Abrasion Skin Location: Scattered   Bruising Skin Location: Scattered          Safety Checklist   1:1 Sitter N    Avasys N     ORAL CARE  Y  PRN   RESTRAINTS  N      Interpreter Services:  Does the patient require  an Interpreter? N    If yes, what form of interpreter services was used? NA    If family was utilized, is interpreter waiver form signed and in the chart? NA      ASSESSMENT/PLAN:    Last BM: 1/28    Pending Orders: NONE    Discharge Plan: Home today   Social/Family visits: none    POC Update: none    Shift Note: Alert and oriented, neuro intact. Ambulating to bathroom with steady gait. On O2 2 liter sating in 90s. No complain of pain. Plan to d/c home this afternoon with son.     PPE worn in patient's room: gloves, mask, goggles.

## 2020-06-01 NOTE — Progress Notes (Addendum)
Attending Attestation:     I have seen and personally examined the patient.  I agree with the history, exam, assessment, and management plans as documented by the resident.  I concur with or have edited all elements of the provider's note, with any caveats as follows    Hospital course:  This is an 80 year old female who presented after several days of cough and fatigue noted to be confused briefly at home.  She was found to have sepsis due to urinary tract infection and pneumonia with acute on chronic hypoxic respiratory failure.  Given her elevated risk score she was started on vancomycin and cefepime which has now been deescalated to Levaquin.  On her previous admission she had a urine culture that was positive for E. coli January 12 which was never treated.  Chest x-ray shows recently seen bilateral opacities that are worse unilaterally.  She may also have emphysematous exacerbation she was started on low-dose steroids.   We will discharge her today home with supervision per physical therapy recommendations.    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 fib-not on anticoagulation  Acute toxic metabolic encephalopathy now resolved  Hypotension-resolved  Sepsis (present on admission) secondary to pneumonia and urinary tract infection (urine culture growing E. coli from January 12)  Healthcare associated pneumonia-drip score elevated at 4      35 minutes spent on discharge planning not including teaching time  Barbette Hair, MD

## 2020-06-01 NOTE — Plan of Care (Signed)
Problem: Safety  Goal: Patient will be free from injury during hospitalization  Outcome: Progressing  Flowsheets (Taken 06/01/2020 0202)  Patient will be free from injury during hospitalization:   Assess patient's risk for falls and implement fall prevention plan of care per policy   Provide and maintain safe environment   Ensure appropriate safety devices are available at the bedside   Hourly rounding   Include patient/ family/ care giver in decisions related to safety   Assess for patients risk for elopement and implement Elopement Risk Plan per policy  Goal: Patient will be free from infection during hospitalization  Outcome: Progressing  Flowsheets (Taken 06/01/2020 0202)  Free from Infection during hospitalization:   Assess and monitor for signs and symptoms of infection   Monitor lab/diagnostic results   Encourage patient and family to use good hand hygiene technique     Problem: Pain  Goal: Pain at adequate level as identified by patient  Outcome: Progressing  Flowsheets (Taken 06/01/2020 0202)  Pain at adequate level as identified by patient:   Identify patient comfort function goal   Assess pain on admission, during daily assessment and/or before any "as needed" intervention(s)   Evaluate if patient comfort function goal is met     Problem: Psychosocial and Spiritual Needs  Goal: Demonstrates ability to cope with hospitalization/illness  Outcome: Progressing  Flowsheets (Taken 06/01/2020 0202)  Demonstrates ability to cope with hospitalizations/illness:   Encourage verbalization of feelings/concerns/expectations   Provide quiet environment   Assist patient to identify own strengths and abilities     Problem: Moderate/High Fall Risk Score >5  Goal: Patient will remain free of falls  Outcome: Progressing  Flowsheets (Taken 05/31/2020 2000)  High (Greater than 13):   HIGH-Consider use of low bed   HIGH-Initiate use of floor mats as appropriate   HIGH-Apply yellow "Fall Risk" arm band    HIGH-Utilize chair pad alarm for patient while in the chair   HIGH-Bed alarm on at all times while patient in bed   HIGH-Visual cue at entrance to patient's room     Problem: Neurological Deficit  Goal: Neurological status is stable or improving  Outcome: Progressing  Flowsheets (Taken 06/01/2020 0202)  Neurological status is stable or improving:   Monitor/assess/document neurological assessment (Stroke: every 4 hours)   Observe for seizure activity and initiate seizure precautions if indicated   Perform CAM Assessment   Monitor/assess NIH Stroke Scale     Problem: Compromised Hemodynamic Status  Goal: Vital signs and fluid balance maintained/improved  Outcome: Progressing  Flowsheets (Taken 06/01/2020 0202)  Vital signs and fluid balance are maintained/improved:   Position patient for maximum circulation/cardiac output   Monitor/assess vitals and hemodynamic parameters with position changes   Monitor/assess lab values and report abnormal values   Monitor and compare daily weight     Problem: Impaired Mobility  Goal: Mobility/Activity is maintained at optimal level for patient  Outcome: Progressing  Flowsheets (Taken 06/01/2020 0202)  Mobility/activity is maintained at optimal level for patient:   Increase mobility as tolerated/progressive mobility   Encourage independent activity per ability   Maintain proper body alignment   Reposition patient every 2 hours and as needed unless able to reposition self   Assess for changes in respiratory status, level of consciousness and/or development of fatigue   Plan activities to conserve energy, plan rest periods     Problem: Nutrition  Goal: Nutritional intake is adequate  Outcome: Progressing  Flowsheets (Taken 06/01/2020 0202)  Nutritional intake is adequate:  Allow adequate time for meals   Encourage/perform oral hygiene as appropriate   Assess anorexia, appetite, and amount of meal/food tolerated     Problem: Anxiety  Goal: Anxiety is at a manageable  level  Outcome: Progressing  Flowsheets (Taken 06/01/2020 0202)  Anxiety is at a manageable level:   Orient to unit   Inform/explain to patient/patient care companion all tests/procedures/treatment/care prior to initiation   Assess emotional status and coping mechanisms   Provide and maintain a safe environment   Facilitate expression of feelings, fears, concerns, anxiety   Provide emotional support   NURSING PROGRESS NOTE: STROKE UNIT    Patient Name: Sabrina Church (80 y.o. female)  Admission Date: 05/30/2020 [facility] Day 2)      Recent Labs   Lab 05/31/20  0400   Sodium 137   Potassium 4.0   Chloride 103   CO2 26   BUN 15.0   Creatinine 0.6   EGFR >60.0   Glucose 104*   Calcium 7.7*       Recent Labs   Lab 05/31/20  0400   WBC 12.78*   Hgb 8.9*   Hematocrit 29.1*   Platelets 186         Patient Lines/Drains/Airways Status       Active Lines, Drains and Airways       Name Placement date Placement time Site Days    Peripheral IV 05/30/20 18 G Right Antecubital 05/30/20    Antecubital  2                       Braden Scale Score: 22 (05/31/20 2000)      Skin Integrity: Bruising,Abrasion  Abrasion Skin Location: scattered  Bruising Skin Location: scattered         Safety Checklist   1:1 Sitter no    Avasys no     ORAL CARE  yes x1   RESTRAINTS no      Interpreter Services:  Does the patient require an Interpreter? no    If yes, what form of interpreter services was used? N/a    If family was utilized, is interpreter waiver form signed and in the chart? N/a      ASSESSMENT/PLAN:    Last BM: 05/30/20    Pending Orders: discharge     Discharge Plan:home     Social/Family visits: none     POC Update: no new updates over night     Shift Note: Pt A&O x4, FC, speech clear. MAE- generalized weakness. Can over comes resistance in all extremities. Sensation intact. OOB with one person stand by. No C/o pain over night. VSS  On 2L NC, satting at 97%.   On tele with sinus arrhythmia, and frequent PVCs, HR in 50s    Tolerating HH diet with thin liquid, takes pills whole with water  Continent X2, no BM over night   Precautions in place, will continue to monitor via purposeful hourly rounding     PPE worn in patient's room: goggles, facemask, gloves

## 2020-06-01 NOTE — Discharge Summary (Signed)
MEDICINE DISCHARGE SUMMARY    Date Time: 06/01/20 3:21 PM  Patient Name: Sabrina Church  Attending Physician: No att. 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 Micro / Labs / Path pending:   [employer]     None          Recent Labs:       Results     Procedure Component Value Units Date/Time    Basic Metabolic Panel [[id number]]  (Abnormal) Collected: 06/01/20 0334    Specimen: Blood Updated: 06/01/20 0458     Glucose 114 mg/dL      BUN 9.0 mg/dL      Creatinine 0.6 mg/dL      Calcium 8.7 mg/dL      Sodium 409 mEq/L      Potassium 4.5 mEq/L      Chloride 103 mEq/L      CO2 29 mEq/L      Anion Gap 6.0    GFR [[id number]] Collected: 06/01/20 0334     Updated: 06/01/20 0458     EGFR >60.0    CBC and differential [[id number]]  (Abnormal) Collected: 06/01/20 0334    Specimen: Blood Updated: 06/01/20 0439     WBC 6.92 x10 3/uL      Hgb 9.7 g/dL      Hematocrit 57.8 %      Platelets 184 x10 3/uL      RBC 3.35 x10 6/uL      MCV 95.8 fL      MCH 29.0 pg      MCHC 30.2 g/dL      RDW 14 %      MPV 10.9 fL      Neutrophils 75.7 %      Lymphocytes Automated 14.5 %      Monocytes 8.8 %      Eosinophils Automated 0.0 %      Basophils Automated 0.1 %      Immature Granulocytes 0.9 %      Nucleated RBC 0.0 /100 WBC      Neutrophils Absolute 5.24 x10 3/uL      Lymphocytes Absolute Automated 1.00 x10 3/uL      Monocytes Absolute Automated 0.61 x10 3/uL      Eosinophils Absolute Automated 0.00 x10 3/uL      Basophils Absolute Automated 0.01 x10 3/uL      Immature Granulocytes Absolute 0.06 x10 3/uL      Absolute NRBC 0.00 x10 3/uL     Culture Blood  Aerobic and Anaerobic [[id number]] Collected: 05/30/20 1645    Specimen: Arm from Blood, Venipuncture Updated: 05/31/20 2121    Narrative:      ORDER#: [id number]                                    ORDERED BY: Baldwin Jamaica  SOURCE: Blood, Venipuncture SP RAC  COLLECTED:  05/30/20 16:45  ANTIBIOTICS AT COLL.:                                RECEIVED :  05/30/20 20:49  Culture Blood Aerobic and Anaerobic        PRELIM      05/31/20 21:21  05/31/20   No Growth after 1 day/s of incubation.      Culture Blood Aerobic and Anaerobic [[id number]] Collected: 05/30/20 1656    Specimen: Arm from Blood, Venipuncture Updated: 05/31/20 1921    Narrative:      ORDER#: [id number]                                    ORDERED BY: Baldwin Jamaica  SOURCE: Blood, Venipuncture R AC                     COLLECTED:  05/30/20 16:56  ANTIBIOTICS AT COLL.:                                RECEIVED :  05/30/20 18:49  Culture Blood Aerobic and Anaerobic        PRELIM      05/31/20 19:21  05/31/20   No Growth after 1 day/s of incubation.      S. PNEUMONIAE, RAPID URINARY ANTIGEN [[id number]] Collected: 05/31/20 0022    Specimen: Urine, Clean Catch Updated: 05/31/20 0946    Narrative:      ORDER#: [id number]                                    ORDERED BY: Williams Che, NIBR  SOURCE: Urine, Clean Catch                           COLLECTED:  05/31/20 00:22  ANTIBIOTICS AT COLL.:                                RECEIVED :  05/31/20 06:06  S. pneumoniae, Rapid Urinary Antigen       FINAL       05/31/20 09:46  05/31/20   Negative for Streptococcus pneumoniae Urinary Antigen             Note:             This is a presumptive test for the direct qualitative             detection of bacterial antigen. This test is not intended as             a substitute for a gram stain and bacterial culture. Samples             with extremely low levels of antigen may yield negative             results.             Reference Range: Negative      Legionella antigen,  urine [[id number]] Collected: 05/31/20 0022    Specimen: Urine, Clean Catch Updated: 05/31/20 0946    Narrative:      ORDER#: [id number]  ORDERED BY: CHOWDHURY, NIBR  SOURCE: Urine, Clean Catch                           COLLECTED:  05/31/20 00:22  ANTIBIOTICS AT COLL.:                                RECEIVED :  05/31/20 06:06  Legionella, Rapid Urinary Antigen          FINAL       05/31/20 09:46  05/31/20   Negative for Legionella pneumophila Serogroup 1 Antigen             Limitations of Test:             1. Negative results do not exclude infection with Legionella                pneumophila Serogroup 1.             2. Does not detect other serogroups of L. pneumophila                or other Legionella species.             Test Reference Range: Negative      Vancomycin, random [[id number]] Collected: 05/31/20 0400    Specimen: Blood Updated: 05/31/20 0534     Vancomycin Random 9.0 ug/mL      Vancomycin Time of Last Dose unk     Vancomycin Date of Last Dose 05/30/2020    Basic Metabolic Panel [[id number]]  (Abnormal) Collected: 05/31/20 0400    Specimen: Blood Updated: 05/31/20 0504     Glucose 104 mg/dL      BUN 78.2 mg/dL      Creatinine 0.6 mg/dL      Calcium 7.7 mg/dL      Sodium 956 mEq/L      Potassium 4.0 mEq/L      Chloride 103 mEq/L      CO2 26 mEq/L      Anion Gap 8.0    GFR [[id number]] Collected: 05/31/20 0400     Updated: 05/31/20 0504     EGFR >60.0    CBC and differential [[id number]]  (Abnormal) Collected: 05/31/20 0400    Specimen: Blood Updated: 05/31/20 0502     WBC 12.78 x10 3/uL      Hgb 8.9 g/dL      Hematocrit 41.3 %      Platelets 186 x10 3/uL      RBC 3.11 x10 6/uL      MCV 93.6 fL      MCH 28.6 pg      MCHC 30.6 g/dL      RDW 15 %      MPV 10.3 fL      Neutrophils 83.6 %      Lymphocytes Automated 9.6 %      Monocytes 5.9 %      Eosinophils Automated 0.1 %      Basophils Automated 0.2 %      Immature Granulocytes 0.6 %      Nucleated RBC 0.0 /100 WBC       Neutrophils Absolute 10.67 x10 3/uL      Lymphocytes Absolute Automated 1.23 x10 3/uL      Monocytes Absolute Automated 0.76 x10 3/uL      Eosinophils Absolute Automated 0.01 x10 3/uL      Basophils Absolute Automated 0.03  x10 3/uL      Immature Granulocytes Absolute 0.08 x10 3/uL      Absolute NRBC 0.00 x10 3/uL     Urinalysis Reflex to Microscopic Exam- Reflex to Culture [[id number]]  (Abnormal) Collected: 05/31/20 0022     Updated: 05/31/20 0059     Urine Type Urine, Clean Ca     Color, UA Yellow     Clarity, UA Clear     Specific Gravity UA 1.012     Urine pH 6.0     Leukocyte Esterase, UA Trace     Nitrite, UA Negative     Protein, UR 30     Glucose, UA Negative     Ketones UA Negative     Urobilinogen, UA Normal mg/dL      Bilirubin, UA Negative     Blood, UA Negative     WBC, UA 0 - 5 /hpf      Squamous Epithelial Cells, Urine 0 - 5 /hpf     Procalcitonin [[id number]]  (Abnormal) Collected: 05/30/20 1546     Updated: 05/30/20 2351     Procalcitonin 13.05    Creatine Kinase (CK) [[id number]] Collected: 05/30/20 1546    Specimen: Blood Updated: 05/30/20 2214     Creatine Kinase (CK) 46 U/L     COVID-19 (SARS-CoV-2) and Influenza A/B, NAA [[id number]] Collected: 05/30/20 1645    Specimen: Culturette from Nasopharyngeal Updated: 05/30/20 1758     Purpose of COVID testing Diagnostic -[facility]     SARS-CoV-2 Specimen Source Nasal Swab     SARS CoV 2 Overall Result Not Detected     Influenza A Not Detected     Influenza B Not Detected    Narrative:      o Collect and clearly label specimen type:  o PREFERRED-Upper respiratory specimen: One Nasal Swab in  Transport Media.  o Hand deliver to laboratory ASAP  Diagnostic -[facility]    Lactic Acid [[id number]] Collected: 05/30/20 1645    Specimen: Blood Updated: 05/30/20 1711     Lactic Acid 1.1 mmol/L     Troponin I [[id number]] Collected: 05/30/20 1546    Specimen: Blood Updated: 05/30/20 1636     Troponin I 0.02 ng/mL     Comprehensive metabolic panel [[id number]]  (Abnormal)  Collected: 05/30/20 1546    Specimen: Blood Updated: 05/30/20 1635     Glucose 137 mg/dL      BUN 74.2 mg/dL      Creatinine 1.0 mg/dL      Sodium 595 mEq/L      Potassium 4.1 mEq/L      Chloride 95 mEq/L      CO2 28 mEq/L      Calcium 8.6 mg/dL      Protein, Total 6.2 g/dL      Albumin 2.9 g/dL      AST (SGOT) 14 U/L      ALT 9 U/L      Alkaline Phosphatase 97 U/L      Bilirubin, Total 0.9 mg/dL      Globulin 3.3 g/dL      Albumin/Globulin Ratio 0.9     Anion Gap 12.0    Magnesium [[id number]] Collected: 05/30/20 1546    Specimen: Blood Updated: 05/30/20 1635     Magnesium 2.4 mg/dL     GFR [[id number]] Collected: 05/30/20 1546     Updated: 05/30/20 1635     EGFR 53.2    PT/APTT [[id number]] Collected: 05/30/20 1546     Updated: 05/30/20 1623  PT 12.7 sec      PT INR 1.1     PTT 30 sec     CBC and differential [[id number]]  (Abnormal) Collected: 05/30/20 1546    Specimen: Blood Updated: 05/30/20 1608     WBC 21.78 x10 3/uL      Hgb 11.0 g/dL      Hematocrit 40.9 %      Platelets 241 x10 3/uL      RBC 3.87 x10 6/uL      MCV 92.8 fL      MCH 28.4 pg      MCHC 30.6 g/dL      RDW 15 %      MPV 10.0 fL      Neutrophils 85.4 %      Lymphocytes Automated 7.4 %      Monocytes 5.0 %      Eosinophils Automated 0.0 %      Basophils Automated 0.1 %      Immature Granulocytes 2.1 %      Nucleated RBC 0.0 /100 WBC      Neutrophils Absolute 18.60 x10 3/uL      Lymphocytes Absolute Automated 1.61 x10 3/uL      Monocytes Absolute Automated 1.09 x10 3/uL      Eosinophils Absolute Automated 0.00 x10 3/uL      Basophils Absolute Automated 0.03 x10 3/uL      Immature Granulocytes Absolute 0.45 x10 3/uL      Absolute NRBC 0.00 x10 3/uL           Procedures/Radiology performed:   Radiology: all results from this admission  CT Head WO Contrast    Result Date: 05/30/2020  No CT evidence of acute intracranial abnormality. Mild global volume loss and chronic small vessel ischemic changes. MRI would be more sensitive for detection of acute  infarct if clinically indicated. Gaylyn Rong, MD  05/30/2020 4:49 PM    CT Chest WO Contrast    Result Date: 05/24/2020   New multifocal bilateral pulmonary opacities possibly infectious. Follow-up imaging is recommended to document resolution. Rocky Crafts, MD  05/24/2020 11:21 AM    XR Chest  AP Portable    Result Date: 05/30/2020    Increasing right basilar airspace disease, stable left-sided airspace disease. Geanie Cooley, MD  05/30/2020 4:00 PM    XR Chest  AP Portable    Result Date: 05/15/2020   Asymmetric left greater than right-sided infiltrates versus edema and perhaps effusions. Recommend follow-up to ensure clearing. Prince Solian, MD  05/15/2020 6:47 PM       Hospital Course:     Reason for admission:     Sepsis     Hospital Course:     Mrs. Feng is a 80 yo F with history of HTN and chronic bronchitis/asthma (on 2LNC at home), paroxysmal atrial fibrillation, and recent hospitalization for UTI who presented with reported altered mental status by her aides. On presentation to ED she was found to be SIRS+ with suspected source being PNA. She was also noted to be in a COPD exacerbation, with wheezing and increased WOB. Her AMS improved dramatically within several hours of admission and IV antibiotics. She was transitioned to oral levofloxacin 500 mg for 5 days (for DRIP 4 and atypical coverage) and prednisone 20 mg daily for 5 days.       Discharge Day Physical Exam:  Temp:  [97.6 F (36.4 C)-97.8 F (36.6 C)] 97.8 F (36.6 C)  Heart Rate:  [50-82] 56  Resp Rate:  [16-18] 16  BP: (110-146)/(47-65) 120/50    Resting comfortably in bed, in NAD  Rate regular, irregularly irregular rhythm, S1 and S2  Lungs CTAB, faint wheezing  Abdomen soft    Wounds/decutibus ulcers/stage: N/A    Consultations:     Treatment Team:   Resident: Thane Edu, Ernestene Mention, MD  Resident: Rob Hickman, MD  Resident: Carney Bern, MD    Discharge Condition:     Home with supervision, already has oxygen    Discharge  Instructions & Follow Up Plan for Patient:     Diet: heart healthy    Activity/Weight Bearing Status: as tolerated      Patient was instructed to follow up with:      Follow-up Information     Hazle Coca, MD Follow up.    Specialty: Internal Medicine  Contact information:  [address]  [place] [address]  [phone]                           Discharge Code Status: NO CPR W/SUP (no intubation or BiPAP, vasopressors OK)  Patient Emergency Contact: Norberta Keens, POA, [phone]    Complete instructions and follow up are in the patient's After Visit Summary    Minutes spent coordinating discharge and reviewing discharge plan: 45 minutes    Discharge Medications:        Discharge Medication List      Taking    aspirin EC 81 MG EC tablet  Dose: 81 mg  Take 81 mg by mouth daily.     azelastine 0.1 % nasal spray  Dose: 2 spray  Commonly known as: ASTELIN  2 sprays by Nasal route 2 (two) times daily Use in each nostril as directed     carvedilol 25 MG tablet  Commonly known as: COREG  TAKE 1 TABLET TWICE A DAY  (ADDITIONAL REFILLS FOLLOWING 03/2020 APPOINTMENT)     cyanocobalamin 500 MCG tablet  Dose: 500 mcg  Commonly known as: VITAMIN B12  Take 1 tablet (500 mcg total) by mouth daily     ferrous sulfate 324 (65 FE) MG Tbec  Dose: 324 mg  Take 1 tablet (324 mg total) by mouth every morning with breakfast     fluticasone 50 MCG/BLIST diskus inhaler  Dose: 220 mcg  Commonly known as: FLOVENT DISKUS  Inhale 220 mcg into the lungs 2 (two) times daily      furosemide 20 MG tablet  Dose: 20 mg  Commonly known as: LASIX  Take 1 tablet (20 mg total) by mouth daily With as needed increase to 40mg  if 2+ lb weight gain overnight or increased dysnpea     levalbuterol 45 MCG/ACT inhaler  Dose: 1-2 puff  Commonly known as: XOPENEX HFA  Inhale 1-2 puffs into the lungs every 4 (four) hours as needed.     levoFLOXacin 500 MG tablet  Dose: 500 mg  Commonly known as: LEVAQUIN  Take 1 tablet (500 mg total) by mouth daily for 3  days     magnesium oxide 400 MG tablet  Dose: 400 mg  Commonly known as: MAG-OX  Take 1 tablet (400 mg total) by mouth daily     predniSONE 20 MG tablet  Dose: 20 mg  Commonly known as: DELTASONE  Take 1 tablet (20 mg total) by mouth every morning with breakfast for 3 days     rosuvastatin 20 MG tablet  Commonly known as:  CRESTOR  TAKE 1 TABLET AT BEDTIME (ADDITIONAL REFILLS FOLLOWING 03/2020 APPOINTMENT)     tiotropium 18 MCG inhalation capsule  Dose: 18 mcg  Commonly known as: SPIRIVA  Place 18 mcg into inhaler and inhale daily             [facility]   Department of Medicine   P: [phone]   F: [phone]    Signed by: Jamesetta So, MD, MD    CC: Hazle Coca, MD

## 2020-06-01 NOTE — Progress Notes (Signed)
Patient alert/orientedx4, calm, cooperative, compliant, stand by assist OOB due to O2 extension line and risk of tripping, otherwise independent. Telemetry: sinus arrhythmia with frequent PVCs, HR controlled, able to give metoprolol 12.5mg  AM dose/B/P WNL. 3L NC at this time, productive cough, Patient states she is feeling better. Home med list and allergies updated in Epic, Son visited and took home meds held in med room. Oral antibiotics and Prednisone started, possible discharge to home tomorrow.

## 2020-06-03 NOTE — Progress Notes (Signed)
Ambulatory Care Management  Writer placed call to patient at [phone] and [phone]  for hospital follow up, introduction to program, and initiation of disease education and management. Left voice messages with my name, contact information and purpose of call. Return call was requested.     Placed call to son Jakiera Ehler at [phone]. Left a voice message advising of purpose of my call and requested return call. Will follow up.    Elpidio Galea, RN, BSN, ACM-RN  Case Manager II  [facility]  [facility]  [address]  [address]  [place], [place] [address]  (T) [phone]

## 2020-06-04 NOTE — Progress Notes (Signed)
Transitional Care ManagementPneumonia     Received voice message from pt's son Donja Tipping returning my call.     Enrollment    Name/Number of person who participated in call:  Reached patient's son Egypt Welcome at [phone] for introduction to program and hospital follow up.     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 Has hx of recurrent PNA's.    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    Pulmonary fibrosis     Pulmonary HTN     Seasonal allergic rhinitis     Stress-induced cardiomyopathy     Ventricular tachycardia 10/27/2019     Patient Active Problem List   Diagnosis    COPD (chronic obstructive pulmonary disease)    Microscopic hematuria    Fecal urgency    Premature heartbeats NOS    Occlusion of extracranial carotid artery, bilateral    Primary pulmonary hypertension    SOB (shortness of breath)    Primary familial cardiomyopathy    Lower extremity edema    Primary restrictive cardiomyopathy    Rhabdomyolysis    Iron deficiency anemia, unspecified iron deficiency anemia type    TIA (transient ischemic attack)       Pt was admitted to [facility] 1/27- 1/29. Pt with history of COPD on home O2- treated for pneumonia.     All eligible TCM Diagnoses  (include A1C, EF, weight):        PNA, COPD  Wt 134 pounds    How are you feeling? Son reports that pt is doing well. He reports that her O2 sats are at baseline low 90s. Pt has O2 which she wears at night or when needed if short of breath or if decreased O2 sats.       Does the patient understand why s/he was in the hospital (specifically what the diagnosis was): Yes      Medication Reconciliation    Medication List:  Current Outpatient Medications   Medication Sig Dispense Refill    aspirin EC 81 MG EC tablet Take 81 mg by mouth daily.      azelastine (ASTELIN) 0.1 % nasal spray 2 sprays by Nasal route 2 (two) times daily Use in each nostril as directed      carvedilol (COREG) 25 MG tablet TAKE 1 TABLET TWICE A DAY  (ADDITIONAL REFILLS FOLLOWING 03/2020 APPOINTMENT) 180 tablet 0    ferrous sulfate 324 (65 FE) MG Tablet Delayed Response Take 1 tablet (324 mg total) by mouth every morning with breakfast 30 tablet 0    fluticasone (FLOVENT DISKUS) 50 MCG/BLIST diskus inhaler Inhale 220 mcg into the lungs 2 (two) times daily  furosemide (LASIX) 20 MG tablet Take 1 tablet (20 mg total) by mouth daily With as needed increase to 40mg  if 2+ lb weight gain overnight or increased dysnpea 180 tablet 0    levalbuterol (XOPENEX HFA) 45 MCG/ACT inhaler Inhale 1-2 puffs into the lungs every 4 (four) hours as needed.      levoFLOXacin (LEVAQUIN) 500 MG tablet Take 1 tablet (500 mg total) by mouth daily for 3 days 3 tablet 0    magnesium oxide (MAG-OX) 400 MG tablet Take 1 tablet (400 mg total) by mouth daily 90 tablet 1    predniSONE (DELTASONE) 20 MG tablet Take 1 tablet (20 mg total) by mouth every morning with breakfast for 3 days 3 tablet 0    rosuvastatin (CRESTOR) 20 MG tablet TAKE 1 TABLET AT BEDTIME (ADDITIONAL REFILLS FOLLOWING 03/2020 APPOINTMENT) 90 tablet 0    tiotropium (SPIRIVA) 18 MCG inhalation capsule Place 18 mcg into inhaler and inhale daily         vitamin B-12 (VITAMIN B12) 500 MCG tablet  Take 1 tablet (500 mcg total) by mouth daily 30 tablet 0     No current facility-administered medications for this visit.       Were the medications reviewed with the patient: [facility] nurse was in for visit earlier today- meds reviewed. Son confirmed that pt had obtained all prescriptions. He reports that last dose of Prednisone and Levaquin were today- both completed.      Son reports that pt is having issue with constipation and inquired if this could be due to antibiotic. CM notes that pt started on Ferrous Sulfate- Advised son that constipation is common side effect when taking Fe. Reviewed use of prune juice, cranberry juice, warm liquids to help with constipation. Reviewed OTC stool softeners. Encouraged family to contact PCP for recommendation. Advised  Call to PCP if pt has contd issue and/ or for abdominal distention, nausea/ vomitiing, or pain.       Physician Follow Up    Plan for follow up with PCP or pulmonologist within 3-5 days of discharge: Son reports that family will call PCP to schedule appt. He confirmed that pt has a Pulmonologist and reports appt is scheduled for the next few weeks- exact date unknown at this time.       Name/number of PCP: Payton Doughty    Son reports that patient will be moving to [place], [place] on 3/10. He has already found a PCP as well as Cardiologist and Pulmonologist for her to see. Family is in the process of having medical record obtained for transfer. CM did advise that pt should be seen by specialist prior to moving. Encouraged family to try and schedule appts with new PCP and specialist for as soon after move as possible to establish care.     Home Health Co-Management    Is patient being followed by Home Health: Yes     If yes:    Name/number of Home Health agency: [facility]- Son reports that [facility] nurse in for visit today.            PNA Management/ COPD Management    Pt has history of COPD, on home O2. Son reports that she uses O2 at night at 2 Liters and intermittently  as needed during the day. She has an O2 concentrator as well as portable Inogen. Reviewed PNA diagnosis. Pt has completed Prednisone and Levaquin. Discussed importance of monitoring for any new or worsening symptoms.     Does  the patient understand that s/he is at risk for pneumonia: Yes     Does the patient understand how long pneumonia recovery can take: Yes     Does the patient use inhalers, nebulizers, or both:Pt has inhalers- maintenance and rescue.       Was the patient educated on the PNA red flags/symptoms:  Yes   Sputum (phlegm) that increases in amount of color or becomes thicker than usual  More short of breath than usual  Profound fatigue; headache  Increased cough even after you have taken your medicine and it has time to work  Fever of 100.5 oral or 99.5 F under your arm   Less energy for activities of daily living   Poor sleep or bothered by symptoms during the night   Poor appetite   Medicines are not helping  (All bulleted symptoms can be part of 2 weeks recovery time)    Is the patient currently experiencing any of the PNA red flags/symptoms: No        Was the patient instructed on what to do if he/she is experiencing any of the PNA red flags/symptoms:  Yes     Son reports that pt has pulse oximeter and checks her O2 sats regularly, multiple times/ day. She also monitors BP. He reports that she is quite diligent in caring for herself. She manager her own medications. Pt's daughter Olegario Messier is in town and plans to stay with pt for 2 weeks to help with cares/ transition. Kathy's contact number provided- [phone].       Call Summary Notes Reached pt's son Jillyn Hidden for ambulatory care management follow up. Jillyn Hidden reports that pt is doing fairly well. He reports that she has done well post hospital discharge. O2 sats are reported at her baseline of low 90s. Pt has home O2 which she reportedly uses at night and as needed. She has concentrator as well as Audiological scientist.  HH nurse was in today for visit.  No medication concerns identified. Family to call to schedule follow up appts.   Reviewed PNA red flags with son- no immediate concerns identified.   Son reports that pt will be moving to [place], [place] on 3/10 and will be living beside of him so that family can be of more assistance. He reports that his sister Olegario Messier is currently staying with pt to help with needs and assist with transition. Kathy's contact number is [phone]. Family is in process of transferring medical record to new PCP and Specialist in [place]. Son confirmed that pt will see her PCP and Specialist her prior to move.   Son denies having any needs at this time. Provided him with my contact information and encouraged him to call with any needs/ concerns. Will cont to follow.     Elpidio Galea, RN, BSN, ACM-RN  Case Manager II  [facility]  [facility]  [address]  [address]  [place], [place] [address]  (T) [phone]

## 2020-06-05 ENCOUNTER — Encounter (INDEPENDENT_AMBULATORY_CARE_PROVIDER_SITE_OTHER): Payer: Self-pay | Admitting: Physician Assistant

## 2020-06-05 DIAGNOSIS — N179 Acute kidney failure, unspecified: Secondary | ICD-10-CM

## 2020-06-06 ENCOUNTER — Other Ambulatory Visit (INDEPENDENT_AMBULATORY_CARE_PROVIDER_SITE_OTHER): Payer: Self-pay

## 2020-06-06 DIAGNOSIS — I425 Other restrictive cardiomyopathy: Secondary | ICD-10-CM

## 2020-06-06 MED ORDER — CARVEDILOL 25 MG PO TABS
ORAL_TABLET | ORAL | 3 refills | Status: AC
Start: 2020-06-06 — End: ?

## 2020-06-06 MED ORDER — ROSUVASTATIN CALCIUM 20 MG PO TABS
ORAL_TABLET | ORAL | 3 refills | Status: AC
Start: 2020-06-06 — End: ?

## 2020-06-06 NOTE — Progress Notes (Signed)
Ambulatory Care Management    Reached patient's daughter Olegario Messier at [phone] for check in call. She reports that pt is doing well. She denies any new concerns. She reports that constipation has resolved.   Daughter reports that she has scheduled appt at PCP office with Linna Hoff on 2/7. She has Cardiology follow up on 2/23. Daughter reports that she is scheduled to have labs drawn tomorrow 2/4 as were ordered by Cardiologist prior to hospialization- She questioned if these should still be drawn. CM made outreach to ordering PA Clois Comber and inquired if pt should have labs drawn tomorrow or wait until later date as she had many lab checks during last hospitalization. Awaiting response and will contact Olegario Messier once receive clarification. Olegario Messier denies having any additional needs at this time. Will follow up.     Elpidio Galea, RN, BSN, ACM-RN  Case Manager II  [facility]  [facility]  [address]  [address]  [place], [place] [address]  (T) [phone]

## 2020-06-07 NOTE — Progress Notes (Signed)
Ambulatory Care Management    Cm had messaged PA Clois Comber inquiring if pt should have labs drawn today 2/4 or wait as she had been readmitted to hospital last week and labs were obtained at that time. Received message advising that pt should wait and have ordered labs drawn in 1-2 weeks.     Reached patient's daughter Olegario Messier at [phone] and advised her of above. She confirmed that she would reschedule labs. Olegario Messier reports that pt is doing well this AM. She denies any concerns at this time.  Olegario Messier has my contact information and was encouraged to call with any questions or concerns. Will continue to follow.    Rudolpho Sevin, RN, BSN, ACM-RN  Case Manager II  [facility]  [facility]  [address]  [address]  [place], [place] [address]  (T) [phone]

## 2020-06-12 NOTE — Progress Notes (Signed)
Transitional Care Management    Reached patient's daughter Sabrina Church for continued follow up. Sabrina Church reports that pt is doing OK. She reports that she continues to tire easily, but feels overall things are going OK.       PNA Management-Teachback--    Can the patient identify the signs and symptoms that PNA is worsening: Yes     Can the patient identify what to do if he/she experiences these symptoms: Yes      Physician Follow Up/Medication Follow Up--    Was the patient seen by PCP or pulmonologist within 3-5 days of Church discharge? Pt was seen by her PCP on 2/7. She has an appt with her Pulmonologist next week and the following week will see her Cardiologist.       Were any medications changed at follow up medical appointment: No        Depression Screen-- Daughter reports that pt is continuing to plan to move to [place] 3/11. She reports that at times she does seem sad about move. She reports that family has offered options for pt to stay in her home if desired and she has chosen to move. Family is supporting pt through move.     CM placed call to patient to speak directly with her at recommendation of daughter. Pt reports that she is feeling well. She reports her Oxygen level throughout the day is mid 90s. She does use supplemental O2 at night as she had been advised by MD that her O2 levels drop when she is sleeping. Pt reports that she has been busy with PT/OT appts, but states she was advised today that these are ending. She confirmed that she feels stronger- She does report that she gets quite tired if she overdoes it.   Pt reports that she will be moving next month. She does state she has mixed emotions regarding move as she has lived in this area most of her life. She states she realizes that it is time for her to move as she cannot maintain her home on her own anymore. She says that she prefers to make this decision on her own while she can and not wait to be told what she has to do. She was able  to identify benefits of move as she will be close to family. She hopes to be able to travel and see her other children, grandchildren as well. Pt expressed that she tries to remain as independent as possible.         Gaps in Care/Resource Connection--    Living situation (alone, family, home environment, etc): Pt's daughter Sabrina Church is staying through the weekend and her son Sabrina Church will come to stay with her when Sabrina Church leaves. He will stay with her until the move. Family is assisting in all needs related to move. They have obtained copies of scans, xrays, etc... and have established new MDs for pt to see.     No needs identified at this time.       Call Summary Notes-- Spoke with patient and her daughter Sabrina Church for follow up. Pt reports that she is doing OK. She reports that her breathing is at baseline. O2 sats are checked periodically throughout the day and she reports that they are mid 90s. She reports that she does wear her O2 at night as she has had sleep studies which show her O2 sats drop when sleeping. She confirmed that she is feeling strong- does get tired if she "overdoes it". Pt  reports that she likes to remain independent and wants to continue to do what she can.   Pt was seen by her PCP earlier this week and has Pulmonology follow up next week and Cardiology follow up the following week on 2/23.   Pt continues to plan for move to [place], [place] next month. Family is staying with pt and assisting with all needs for move. No needs identified at this time.   Will continue to follow.    Sabrina Galea, RN, BSN, ACM-RN  Case Manager II  [facility]  Sabrina Church  [address]  [address]  [place], [place] [address]  (T) [phone]

## 2020-06-14 ENCOUNTER — Telehealth (INDEPENDENT_AMBULATORY_CARE_PROVIDER_SITE_OTHER): Payer: Self-pay

## 2020-06-14 NOTE — Telephone Encounter (Signed)
BMP lab order from 01/19 OV faxed to [facility] fx: [phone] per daughter's request. Daughter reported they are currently at [facility] but do not have the lab requisition.

## 2020-06-15 LAB — BASIC METABOLIC PANEL
BUN: 19 mg/dL (ref 7–25)
CO2: 36 mmol/L — ABNORMAL HIGH (ref 20–32)
Calcium: 8.7 mg/dL (ref 8.6–10.4)
Chloride: 100 mmol/L (ref 98–110)
Creatinine: 0.77 mg/dL (ref 0.60–0.88)
EGFR African American: 84 mL/min/{1.73_m2} (ref >=60)
Glucose: 119 mg/dL (ref 65–139)
NON-AFRICAN AMERICA EGFR: 72 mL/min/{1.73_m2} (ref >=60)
Potassium: 4.5 mmol/L (ref 3.5–5.3)
Sodium: 142 mmol/L (ref 135–146)

## 2020-06-19 NOTE — Progress Notes (Signed)
Ambulatory Care Management  Placed call to patient at [phone] (home) for continued ambulatory care management follow up. Left a message advising of purpose of my call and encouraged return call to myself at [phone]. Will follow up.     Elpidio Galea, RN, BSN, ACM-RN  Case Manager II  [facility]  [facility]  [address]  [address]  [place], [place] [address]  (T) [phone]

## 2020-06-26 ENCOUNTER — Ambulatory Visit (INDEPENDENT_AMBULATORY_CARE_PROVIDER_SITE_OTHER): Payer: Medicare Other | Admitting: Physician Assistant

## 2020-06-26 ENCOUNTER — Encounter (INDEPENDENT_AMBULATORY_CARE_PROVIDER_SITE_OTHER): Payer: Self-pay | Admitting: Physician Assistant

## 2020-06-26 VITALS — BP 122/70 | HR 80 | Ht 63.0 in | Wt 129.0 lb

## 2020-06-26 DIAGNOSIS — J42 Unspecified chronic bronchitis: Secondary | ICD-10-CM

## 2020-06-26 DIAGNOSIS — I5032 Chronic diastolic (congestive) heart failure: Secondary | ICD-10-CM

## 2020-06-26 DIAGNOSIS — I420 Dilated cardiomyopathy: Secondary | ICD-10-CM

## 2020-06-26 NOTE — Progress Notes (Signed)
[facility] C[facility] OFF[facility] PROGRESS NOTE    HRT [facility] OFFICE      [facility] OFF[facility] -CARDIOLOGY  [address]  [place] [address]  Dept: [phone]  Dept Fax: [phone]       Patient Name: Sabrina Church    Date of Visit:  June 26, 2020  Date of Birth: 1940-10-14  AGE: 80 y.o.  Medical Record #: [id number]  Requesting Physician: Hazle Coca, MD      CHIEF COMPLAINT: No chief complaint on file.      HISTORY OF PRESENT ILLNESS:    She is a pleasant 80 y.o. female who presents today in company of son for hospital f/u after recent admission Jan 2022 with sepsis from PNA, COPD exacerbation. Still convalescing. Has cough persisting. Saw pulmonologist in follow up recently. She is moving to [place] permanently in the next 2 weeks and already has f/u arranged with cardiologist there.  Wt is down several lbs compared to prior - she has been trying to lose weight.       PAST MEDICAL HISTORY: She has a past medical history of Arrhythmia-PSVT, Arrhythmia-PVCs, Asthma, Broken heart syndrome (2013), Carotid artery disease, COPD (chronic obstructive pulmonary disease), Dyspnea on exertion, Echocardiogram (02/2012;11/2012;08/01/13;02/2016;05/2017), Holter Monitor (03/2012;02/2017), Hypertension, Lower extremity edema (10/27/2019), Myocardial perfusion scan (02/12/2012), OSA (obstructive sleep apnea) (07/19/2012), Primary pulmonary hypertension (10/27/2019), Primary restrictive cardiomyopathy (10/27/2019), Pulmonary fibrosis, Pulmonary HTN, Seasonal allergic rhinitis, Stress-induced cardiomyopathy, and Ventricular tachycardia (10/27/2019). She has a past surgical history that includes Hysterectomy; proaspe; Tubal ligation (Bilateral); and Cardiac catheterization (Decemeber 2013).    ALLERGIES:   Allergies   Allergen Reactions    Ceclor [Cefaclor] Hives and Rash     Tolerated cefepime Jan 2022    Amoxicillin Diarrhea and Nausea And Vomiting       MEDICATIONS:   Current  Outpatient Medications:     aspirin EC 81 MG EC tablet, Take 81 mg by mouth daily., Disp: , Rfl:     azelastine (ASTELIN) 0.1 % nasal spray, 2 sprays by Nasal route 2 (two) times daily Use in each nostril as directed, Disp: , Rfl:     carvedilol (COREG) 25 MG tablet, TAKE 1 TABLET TWICE A DAY, Disp: 180 tablet, Rfl: 3    fluticasone (FLOVENT DISKUS) 50 MCG/BLIST diskus inhaler, Inhale 220 mcg into the lungs 2 (two) times daily  , Disp: , Rfl:     furosemide (LASIX) 20 MG tablet, Take 1 tablet (20 mg total) by mouth daily With as needed increase to 40mg  if 2+ lb weight gain overnight or increased dysnpea, Disp: 180 tablet, Rfl: 0    levalbuterol (XOPENEX HFA) 45 MCG/ACT inhaler, Inhale 1-2 puffs into the lungs every 4 (four) hours as needed., Disp: , Rfl:     magnesium oxide (MAG-OX) 400 MG tablet, Take 1 tablet (400 mg total) by mouth daily, Disp: 90 tablet, Rfl: 1    rosuvastatin (CRESTOR) 20 MG tablet, TAKE 1 TABLET AT BEDTIME, Disp: 90 tablet, Rfl: 3    tiotropium (SPIRIVA) 18 MCG inhalation capsule, Place 18 mcg into inhaler and inhale daily  , Disp: , Rfl:      FAMILY HISTORY: family history includes COPD in her mother; Cardiomyopathy in her brother; Diabetes type II in her mother; Heart disease in her father, mother, and sister; Heart failure in her mother; Myocardial Infarction in her father.    SOCIAL HISTORY: She reports that she quit smoking about 21 years ago. She has a  60.00 pack-year smoking history. She has never used smokeless tobacco. She reports that she does not drink alcohol and does not use drugs.    PHYSICAL EXAMINATION    Visit Vitals  BP 122/70 (BP Site: Left arm, Patient Position: Sitting, Cuff Size: Medium)   Pulse 80   Ht 1.6 m (5\' 3" )   Wt 58.5 kg (129 lb)   BMI 22.85 kg/m       General Appearance:  A well-appearing female in no acute distress.    Skin: Warm and dry to touch   Head: Normocephalic   Eyes: EOMI  ENT: masked  Neck: JVP normal     Chest: wet, cough,  rales  Cardiovascular: Regular rhythm, S1 normal, S2 normal, No S3 or S4. Apical impulse not displaced. No murmur. No gallops or rubs detected   Abdomen: no guarding  Extremities: Warm without edema.    Neuro: Alert and oriented x3.            LABS:   Lab Results   Component Value Date    WBC 6.92 06/01/2020    HGB 9.7 (L) 06/01/2020    HCT 32.1 (L) 06/01/2020    PLT 184 06/01/2020     Lab Results   Component Value Date    GLU 119 06/14/2020    BUN 19 06/14/2020    CREAT 0.77 06/14/2020    NA 142 06/14/2020    K 4.5 06/14/2020    CL 100 06/14/2020    CO2 36 (H) 06/14/2020    AST 14 05/30/2020    ALT 9 05/30/2020     Lab Results   Component Value Date    MG 2.4 05/30/2020    TSH 2.23 05/16/2020    HGBA1C 6.1 (H) 02/09/2012    BNP 175 (H) 05/15/2020     Lab Results   Component Value Date    CHOL 127 10/12/2018    TRIG 94 10/12/2018    HDL 54 10/12/2018    LDL 55 10/12/2018         IMPRESSION:  Ms.Mogeris a 80 y.o.femalewith the following problems:       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 TTE Dec 2021 EF preserved, moderate MR, mild pulmonary HTN, G2DD   Frequent PVCs,4% burden by Nov 2021 ambulatory monitor   ? ECG taken after our visit with no PVCs, bifascicular block right bundle/left anterior fascicle  ? 24-hour Holter Oct 2018 PVC burden 2%, average heart rate 58 bpm  ? HolterDec2014 PVC burden 6.4% single 4 beat NSVT  ? HolterJuly2014 PVC burden 6.1%, 2 runs NSVT longest 9 beats  ? HolterApr2014 PVC burden 13.3%, single 12 beat run NSVT  ? Holter Nov 2013PVC burden 21%, 6 runs NSVT longest 12 beats  ? Holter January 2012 PVC burden 19.7%, 36 runs NSVT longest 10 beats      Non-ischemic cardiomyopathywith recovered EF  ? Likely PVC related, 2013vs stressTakotsuboCM  ? Echo May 2020LV size function normal EF 55%, normal RV, aortic sclerosis, trace TR  ? 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 Birdie Sons retirement, triggered by allergy or change in weather, not frequently requiring inhaler but twice treated with short course steroids and antibiotics in the last 3 to 6 months  Symptomatic palpitations with a history of PSVT and suggestive of the same, broken easily with vagal maneuver and with rare triggers   Asymptomatic moderatecarotid atherosclerosis  ? Carotid duplex November 2020 bilateral moderate plaque w/bilateral50 to 69%,no change from 2019   Intolerance to high-dose atorvastatin   Angiographically normal coronaries in 2013 with no interim angina or progressive effort intolerance and no interim ischemic eval.      RECOMMENDATIONS:     Continue current cardiac medicines   Notify if edematous or wt up precipitously   Keep close appointment already arranged to establish with cardiologist in [facility]                                                 No orders of the defined types were placed in this encounter.      No orders of the defined types were placed in this encounter.      SIGNED:    Lewanda Rife, PA          This note was generated by the Dragon speech recognition and may contain errors or omissions not intended by the user. Grammatical errors, random word insertions, deletions, pronoun errors, and incomplete sentences are occasional consequences of this technology due to software limitations. Not all errors are caught or corrected. If there are questions or concerns about the content of this note or information contained within the body of this dictation, they should be addressed directly with the author for clarification.

## 2020-06-26 NOTE — Progress Notes (Signed)
Ambulatory Care Management   Cm notes that pt was seen at [facility] today for follow up. Reviewed notes from visit. Will plan to make outreach to pt later this week.     Elpidio Galea, RN, BSN, ACM-RN  Case Manager II  [facility]  [facility]  [address]  [address]  [place], [place] [address]  (T) [phone]

## 2020-06-27 ENCOUNTER — Other Ambulatory Visit (INDEPENDENT_AMBULATORY_CARE_PROVIDER_SITE_OTHER): Payer: Self-pay | Admitting: Cardiovascular Disease

## 2020-06-27 DIAGNOSIS — I425 Other restrictive cardiomyopathy: Secondary | ICD-10-CM

## 2020-06-28 NOTE — Progress Notes (Signed)
Ambulatory Care Management  Reached patient for continued follow up. Patient reports  That she is doing OK. She did report that she had been a little more congested. She reports she is coughing up phlegm that is clear- white. She denies feeling more short of breath. Writer did note pt to be coughing during call. Inquired about O2 sats and pt reported that she had not checked today. She checked O2 sats during call which were initially 79% and did got to low 80s on RA. Had pt warm her hands with no improvement in numbers. Pt put on supplemental O2 at 2 L and O2 sats up to 92% after a few minutes. Cm expressed concern as this is a change for pt as she does not typically require O2 during the day and she had admitted to having more productive cough. Advised that I felt Pulmonologist should be contacted so that she could be assessed and she declined. She reported that a [facility] [place] nurse was on the way to see her and she reported feeling OK. Inquired if she had tried he rescue inhaler since she had noticed some new symptoms and she had not. Reviewed use of Xopenex to help when she is having active symptoms and encouraged use. She confirmed that she has been doing her maintenance inhalers. Discussed importance of notifying MD early on when symptoms start to try and avoid a severe exacerbation. She stated that she preferred to wait until nurse in for evaluation today and states she should be there within the hour. Requested that she have [facility] nurse call me during visit so that we can coordinate care. Advised pt that she should go to ED if she begins having difficulty breathing and/ or O2 sats less than 90.  Will plan to follow up today and assess needs. Will recommend [facility] referral is appropriate per nurse's evaluation if pt agrees.    Elpidio Galea, RN, BSN, ACM-RN  Case Manager II  [facility]  [facility]  [address]  [address]  [place], [place]  [address]  (T) [phone]

## 2020-06-28 NOTE — Progress Notes (Signed)
Ambulatory Care Management  Returned call to patient to follow up around 2:30 PM. Spoke with [facility] nurse who was in home at time of call for her visit. She reports that patient is doing OK. She stated that she felt that pt was having some symptoms due to change in weather but did not express concern. Cm again spoke with pt and advised that I felt it was important to contact Pulmonologist office in order to help prevent any worsening exacerbation. She agreed to me calling.   Placed call to Dr. Johnn Hai office and was transferred to nurse Triage line. Left a voice message advising of concerns and requested call to patient and to myself for follow up.     Returned call to pt to insure follow up. She confirmed that she hd spoken with Dr. Wilma Flavin and reports that she was advised to start Allegra and Nasocort for symptoms. She reports  That a friend had already been to pharmacy and picked up medications for her to start. Pt denies feeling short of breath at time of call. Asked her to check O2 sats- Sats were 88- 89% on RA. Advised her to wear her O2 to keep sats > 90%.   Advised pt of services of [facility] and provided her with contact information. Strongly encouraged her to call if she began feeling any worse. Advised her to call 911 or return to ED if having breathing difficulty or other emergent symptoms. Pt stated she does not want to return to hospital. Re-iterated importance of trying to manage symptoms early and again encouraged her to call [facility] for evaluation if she has any worsening symptoms or concerns. Pt confirmed that she would.      Pt has my contact information and was encouraged to call with any questions or concerns. Will continue to follow.    Rudolpho Sevin, RN, BSN, ACM-RN  Case Manager II  [facility]  [facility]  [address]  [address]  [place], [place] [address]  (T) [phone]

## 2020-07-01 NOTE — Progress Notes (Signed)
Ambulatory Care Management  Placed call to check in on patient. She reports that she is feeling a little better. No difficulty breathing. O2 sats today 91% on RA. She reports cough has improved. She did report that she had been advised on Friday by Pulmonologist to start Allegra and Nasocort which she states may have helped "a little". Pt does report that she had 2 instances over the weekend when her o2 sats dropped with exertion. She reports she recovered to above 90% with supplemental O2. Advised pt to wear O2 when she will be exerting herself to avoid having these drops in oxygenation and to help not feel as short of breath and tired.   Pt states she was very appreciative to have the number for [facility] and states she will call them in future if she feels she needs to be seen that day. Pt denies needs at this time. Will plan to follow up in a few days to insure no concerns/ needs.    Elpidio Galea, RN, BSN, ACM-RN  Case Manager II  [facility]  [facility]  [address]  [address]  [place], [place] [address]  (T) [phone]

## 2020-07-04 NOTE — Progress Notes (Signed)
Ambulatory Care Management    Reached patient for continued ambulatory care management. Pt reports that she is feeling OK. She confirmed that she is feeling better, cough has improved but does report feeling tired at times. She reports that she is in the midst of packing her things in preparation for her move next week. She denies feeling short of breath. Inquired about O2 sats and she had not checked today. She checked O2 sats during call and they were 88%. She did put on supplemental o2 and O2 sats up to 94%. Reminded her of importance of monitoring O2 sats at home and using O2 as needed.         Wellness--      What is the patients current/history of tobacco use/abuse: Per history, pt was a former smoker- quit in 2000.    How often/much does the patient exercise: Pt reports that she does some exercise around the house. No reg exercise routine identified.        PNA Self management/Health Maintenance--    Pt has scheduled follow up with new providers for after her move. She rpeorts that medical records have been sent for continuation of cares.     Advance Directives--    Has the patient participated in an advance care planning process: Yes     Has the patient completed an advance care planning document: Yes    Do all interested parties have a copy of the advance care planning document: No- Pt confirmed she has extra copies and will insure her son has a copy.         Gaps in Care/Resource Connection--    Pt will be moving 3/10 to [place] to an apartment beside her son. Her family will be coming this weekend to assist with move. She has already established care for after move.  She denies any needs at this time.       Call Summary Notes-- Reached pt for final scheduled follow up call. Pt reports that she is feeling some better. She reports cough has improved. She denies shortness of breath. O2 sats were initially 88% on RA when checked during call. She put on her supplemental O2 and sats up to 94%.  Reminded her of importance of monitoring sats and keeping sats > 90%.   Pt reports that she will be moving next week to [place]. She has established appts with new providers. Family is coming this weekend to assist with any needs with move.   Pt denies having any needs at this time. Pt has completed 30 days post hospital discharge- case will be closed. Encouraged pt to call me for any questions/ needs.    Elpidio Galea, RN, BSN, ACM-RN  Case Manager II  [facility]  [facility]  [address]  [address]  [place], [place] [address]  (T) [phone]

## 2020-08-06 NOTE — Progress Notes (Signed)
[facility] OFFICE   [address] [place], [place] [address]     TELEMEDICINE VISIT       Sabrina Church, Sabrina Church    Date of Visit:  09/15/2018  Date of Birth: 1940/06/19  Age: 80 yrs.   Medical Record Number: [id number]  __  CURRENT DIAGNOSES     1. Primary pulmonary hypertension,  I27.0  2. Premature beats NOS, I49.40  3. 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 Shortness Of Breath, R06.02  5. Edema, lower extremity, R60.0  6. Cardiomyopathy, unspecified, I42.9  7. Ventricular tachycardia,  I47.2  8. Ventricular premature depolarization, I49.3  9. Congestive Heart Failure, 428.0  10. 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 Pre-op cardiovascular examination, Z01.810  __   ALLERGIES    Cefaclor, Hives and/or rash  __   MEDICATIONS     1. Spiriva with HandiHaler 18 mcg capsule, w/inhalation device, 2 puffs qam  2. aspirin 81 mg chewable tablet, 1 tabp o daily  3. magnesium oxide 400 mg (241.3 mg magnesium)  tablet, 1 po qd  4. Lasix 20 mg tablet, 1 pill everyother day alternating with 2 pills everyother day  5. lisinopril 2.5 mg tablet, 1 po q am  6. rosuvastatin 20 mg tablet, 1 po qhs  7. carvedilol 25 mg tablet, 1 po bid    __   HISTORY OF PRESENT ILLNESS  The visit today was conducted via telemedicine due to COVID-19 precautions. The patient was in their home and was informed and gave verbal consent to proceed.    She  has ongoing cough that fluctuates with the weather. She hasn't had sustained palpitations but had self limited frequent pSVT and single 13b NSVT on monitor in April. She is doing light weights and band exercises but inconsistently    __   PAST HISTORY     Past Medical Illnesses: Asthma, COPD, pulmonary HTN;   Past Cardiac Illnesses: Arrhythmia-PSVT, Arrhythmia-PVCs, Cardiomyopathy, carotid artery disease;  Infectious Diseases: No previous history of significant infectious diseases.; Surgical Procedures: Hysterectomy,  Tubal Ligation; Trauma History: No previous history  of significant trauma.; Cardiology Procedures-Invasive : Cardiac Cath December 2013; Cardiology Procedures-Noninvasive: Echocardiogram, MPI 02/12/12, Echocardiogram October 2013, Holter Monitor November 2013,  Echocardiogram July 2014, Limited echocardiogram 08/01/13, Echocardiogram October 2017, Holter Monitor October 2018, Echocardiogram January 2019; Cardiac Cath Results : 04/2012: Normal coronary arteries, moderate left ventricular dysfunction, ejection fraction 40%, and moderate pulmonary hypertension.; Left Ventricular Ejection Fraction : LVEF of 55% documented via echocardiogram on 05/28/2017; Peripheral Vascular Procedures: Carotid 12/21/12, Carotid NIVA 2015, Carotid NIVA October 2018   ___  FAMILY HISTORY  Brother -- Cardiomyopathy  Father --  Myocardial infarct in 1st degree female relative 32 (MI at age 50)  Mother -- Congestive heart failure, Chronic obstructive lung disease, Type 2 Diabetes     __  CARDIAC RISK FACTORS     Tobacco Abuse: used to smoke, but quit; Family History of  Heart Disease: strong family history of heart disease, Male55 y/o; Hyperlipidemia: positive;  Hypertension: negative;  Diabetes Mellitus: negative;  Prior History of Heart Disease: negative; Obesity: positive;  Sedentary Life Style:negative; JYN:[id number]; Menopausal :positive  __  SOCIAL HISTORY    Alcohol  Use: Does not use alcohol; Smoking: Used to smoke but quit; Former smoker [phone]);  Diet: Regular diet and Caffeine use-1-2 per day; Exercise: walking in house, fit bit  2.25miles/d, 3100  steps/d;   __  PHYSICAL EXAMINATION    Vital Signs: 09/15/18 107/52 152.80  64.00 73 26.23 09/15/18 13:51:44 107 52 13:51:44 KSWIDAN 09/15/18  13:51:44 1 KSWIDAN P 79        Constitutional: Well developed, well nourished, in no acute distress   Head: normocephalic, normal female hair pattern Eyes : EOMS intact, conjunctivae and lids unremarkable ENT: No pallor or cyanosis Neck : no JVD Chest: normal respiratory effort Neurological : oriented  to time, person and place, affect appropriate   __        IMPRESSIONS:  1. Symptomatic palpitations with frequent pSVT and single 13b NSVT on recent ambulatory monitor worn late April 2020.  2. Prior cardiomyopathy,  normalized EF, stable on repeat echo 09/09/2018.  3. Chronic dyspnea with recurrent chronic bronchitis and nocturnal 2L 02. Followed by Dr Brynda Rim  4. Chronic heart failure preserved EF with normalized EF and low filling pressures by echo 5/8 and  no overt heart failure   5. Prior PVC now very rare, prior PVC- related cardiomyopathy   6. Moderate bilateral carotid atherosclerosis 50 to 69%, stable on duplex performed last October 2019  7. Intolerance to high-dose atorvastatin  8.  Angiographically normal coronaries in 2013 with no interim angina or progressive effort intolerance and no interim ischemic evaluation.   9. Normal TSH on recent labs       RECOMMENDATIONS:  1. Follow up labs. Seems BNP not performed  and OK as echo does not reflect any clinical heart failure and dyspnea more pulmonary clinically. If progressive, may send for BNP   2. She wants to pare back diuretic dosing and I think this should be done in the future but hesitate to do so in the  midst of the COVID peak and while she is having ongoing pulmonary issues that mild heart failure exacerbation would complicate further.  3. Reassuringly stable EF, low risk NSVT but need continue BB and insure stable electroytes. She hasn't had recurrent  symptoms but will call if they recur.   4. Again underscored COVID precautions given her comorbidities  5. Repeat carotid duplex in fall.    Barring progressive palpitations or symptoms, follow up in the office in 4months after repeat carotid  duplex.    Johnell Comings, M.D.      cc: Hazle Coca MD

## 2020-08-06 NOTE — Progress Notes (Signed)
[facility] OFFICE   [address] [place], [place] [address]     TELEMEDICINE VISIT       Sabrina Church, Sabrina Church    Date of Visit:  08/26/2018  Date of Birth: August 15, 1940  Age: 80 yrs.   Medical Record Number: [id number]  __  CURRENT DIAGNOSES     1. Primary pulmonary hypertension,  I27.0  2. Premature beats NOS, I49.40  3. Shortness Of Breath, R06.02  4. Edema, lower extremity, R60.0  5. 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 Cardiomyopathy, unspecified, I42.9  7. Ventricular premature  depolarization, I49.3  8. Congestive Heart Failure, 428.0  9. 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 Pre-op cardiovascular examination, Z01.810  __  ALLERGIES     Cefaclor, Hives and/or rash  __  MEDICATIONS     1. Spiriva with HandiHaler 18 mcg capsule,  w/inhalation device, 2 puffs qam  2. aspirin 81 mg chewable tablet, 1 tabp o daily  3. magnesium oxide 400 mg (241.3 mg magnesium) tablet, 1 po qd  4. Lasix 20 mg tablet, 1 pill everyother day alternating with 2 pills everyother day  5.  lisinopril 2.5 mg tablet, 1 po q am  6. rosuvastatin 20 mg tablet, 1 po qhs  7. carvedilol 25 mg tablet, 1 po bid  8. Flovent inhaled    __  HISTORY OF PRESENT ILLNESS   The visit today was conducted via telemedicine due to COVID-19 precautions. The patient was in their home and was informed and gave verbal consent to proceed.      She has had recurrent bronchitis since November seen by [facility] on a few courses  of antibiotics. She wants to decrease her lasix because of urinary frequency but also has just began treating a UTI and she was evaluated by a urologist and declined cystoscopy.     She has gradual weight loss, down 12lb since our 02/2018 visit.  She denies orthostatic lightheadedness.  __  PAST HISTORY     Past Medical Illnesses : Asthma, COPD, pulmonary HTN;  Past Cardiac Illnesses: Arrhythmia-PSVT, Arrhythmia-PVCs, Cardiomyopathy,  carotid artery disease; Infectious Diseases: No previous history of  significant infectious diseases.;  Surgical Procedures: Hysterectomy, Tubal Ligation; bladder prolapse surgery Trauma History: No previous  history of significant trauma.; Cardiology Procedures-Invasive: Cardiac Cath December 2013; Cardiology  Procedures-Noninvasive: Echocardiogram, MPI 02/12/12, Echocardiogram October 2013, Holter Monitor November 2013, Echocardiogram July 2014, Limited echocardiogram 08/01/13, Echocardiogram October 2017, Holter  Monitor October 2018, Echocardiogram January 2019; Cardiac Cath Results: 04/2012: Normal coronary arteries, moderate left ventricular dysfunction, ejection  fraction 40%, and moderate pulmonary hypertension.; Left Ventricular Ejection Fraction: LVEF of 55% documented via echocardiogram on 05/28/2017;  Peripheral Vascular Procedures: Carotid 12/21/12, Carotid NIVA 2015, Carotid NIVA October 2018  ___   FAMILY HISTORY  Brother -- Cardiomyopathy  Father --  Myocardial infarct in 1st degree female relative 17 (MI at age 52)  Mother -- Congestive heart failure, Chronic obstructive lung disease, Type 2 Diabetes     __  CARDIAC RISK FACTORS     Tobacco Abuse: used to smoke, but quit; Family History of  Heart Disease: strong family history of heart disease, Male55 y/o; Hyperlipidemia: positive;  Hypertension: negative;  Diabetes Mellitus: negative;  Prior History of Heart Disease: negative; Obesity: positive;  Sedentary Life Style:negative; WGN:[id number]; Menopausal :positive  __  SOCIAL HISTORY    Alcohol  Use: Does not use alcohol; Smoking: Used to smoke but quit; Former smoker [phone]);  Diet: Regular diet and Caffeine  use-1-2 per day; Exercise: walking in house, fit bit  2.12miles/d, 3100  steps/d;   __  PHYSICAL EXAMINATION    Vital Signs:  Blood Pressure:   128/73 Sitting, Right arm, regular cuff taken 0    Weight: 152.00 lbs.  Height:  64.00"  BMI: 26.09   Pulse: 70/min. Brachial        Constitutional: Well developed, well nourished, in no acute distress  Head : normocephalic,  normal female hair pattern Eyes: EOMS intact, conjunctivae and lids unremarkable  ENT: No pallor or cyanosis Neck: no JVD Chest : normal respiratory effort Neurological: oriented to time, person and place, affect appropriate Extremities no edema  __      IMPRESSIONS:   1. Chronic dyspnea with recurrent chronic bronchitis and nocturnal 2L 02.  2. Chronic heart failure preserved EF with normalized EF on last echocardiogram May 28, 2017, euvolemic on current Lasix dosing  2. Prior PVC now very rare, prior PVC-  related cardiomyopathhy relieved with deep breathing.  3. 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 Moderate bilateral carotid atherosclerosis 50 to  69%, stable on duplex performed in our office last week October 2019  5. Intolerance to high-dose atorvastatin  6. Angiographically normal coronaries in 2013 with no interim angina or progressive effort intolerance and no interim ischemic eval.    7. Chronic bronchitis followed by Dr. Brynda Rim    RECOMMENDATIONS:  1. Counseled the importance of sheltering in place and social distancing to limit COVID19 exposure particularly given her comorbidities  2. Continue current meds and lasix  with daily weight monitoring. Repeat labs in 3-4 months  3. Repeat carotid duplex in fall prior to follow up.    Clinical followup with me at 6 months.  Johnell Comings, M.D.      cc: Hazle Coca MD

## 2020-08-06 NOTE — Progress Notes (Signed)
[facility] OFFICE  [address] [place], [place] [address]     [name]    Date of Visit:  02/14/2016  Date of Birth: October 05, 1940  Age: 80 yrs.   Medical Record Number: [id number]  __  CURRENT DIAGNOSES     1. Primary pulmonary hypertension,  I27.0  2. Cardiomyopathy, unspecified, I42.9  3. Ventricular premature depolarization, I49.3  4. Occlusion and stenosis of bilateral carotid arteries, I65.23  5. Shortness of breath, R06.02  6. Congestive Heart Failure, 428.0   __  ALLERGIES    Cefaclor, Hives and/or rash   __  MEDICATIONS     1. Flovent Diskus 250 mcg/actuation disk with device, 1 puff qd  2. Xopenex HFA 45 mcg/actuation HFA aerosol inhaler, prn  only  3. Spiriva with HandiHaler 18 mcg capsule, w/inhalation device, 2 puffs qam  4. magnesium oxide 400 mg tablet, 1 po qd  5. aspirin 81 mg tablet,delayed release, 1 po qd  6. Atorvastatin 80 Mg Tablet, 1 po qd  7. carvedilol 25  mg tablet, 1 po bid  8. Lasix 20 mg tablet, 1 pill everyother day alternating with 2 pills everyother day  9. 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 I actually last saw her in 2015 because she was seen by my colleague Harvin Hazel in August 2016. She has had some stress  related to family and particularly the passing of her brother-in-law in July and her sister's dementia. Overall, however, she tries to stay reasonably active, goes for walks and notes stable exercise tolerance and rare occasional irregularity of her heartbeat.  She has had two bouts of bronchitis requiring steroids, but has not had any hospitalizations for chronic bronchitis for which she follows with Dr. Brynda Rim. She has had some weight gain of about five pounds, but shows no signs of fluid overload and continues  on her chronic Lasix regimen of 20 alternating with 40. Last labs were in  2016 with Dr. Delia Heady. She does have a rash on presentation here that looked like poison ivy with possible superinfection cellulitis of the right forearm. Repeat ECG shows bifascicular  block, unchanged from her baseline.  __  PAST HISTORY     Past Medical Illnesses : Asthma, COPD, pulmonary HTN;  Past Cardiac Illnesses: Arrhythmia-PSVT, Arrhythmia-PVCs, Cardiomyopathy,  carotid artery disease; Infectious Diseases: No previous history of significant infectious diseases.;  Surgical Procedures: Hysterectomy, Tubal Ligation; Trauma History: No previous history of significant trauma.;  Cardiology Procedures-Invasive: Cardiac Cath December 2013; Cardiology Procedures-Noninvasive: Echocardiogram,  MPI 02/12/12, Echocardiogram October 2013, Holter Monitor November 2013, Echocardiogram July 2014, Limited echocardiogram 08/01/13; Cardiac Cath Results : 04/2012: Normal coronary arteries, moderate left ventricular dysfunction, ejection fraction 40%, and moderate pulmonary hypertension.; Left Ventricular Ejection Fraction : LVEF of 50% documented via Limited echocardiogram on 08/01/2013; Peripheral Vascular Procedures: Carotid 12/21/12, Carotid NIVA 2015   ___  FAMILY HISTORY  Brother -- Cardiomyopathy   Father -- Myocardial infarct in 1st degree female relative 49 (MI at age 23)  Mother -- Congestive heart  failure, Chronic obstructive lung disease, Type 2 Diabetes    __  CARDIAC RISK FACTORS      Tobacco Abuse: used to smoke, but quit; Family History of Heart Disease: strong family history of heart  disease, Male55 y/o; Hyperlipidemia: positive; Hypertension : negative;  Diabetes Mellitus: negative; Prior History  of Heart Disease: negative; Obesity: positive; Sedentary  Life Style:negative; [id number]; Menopausal :positive  __  SOCIAL HISTORY    Alcohol Use : Does not use alcohol; Smoking: Used to smoke but quit; Former smoker [phone]); Diet : Regular diet and Caffeine use-1-2 per day; Exercise: No regular exercise;    __  PHYSICAL EXAMINATION     Vital Signs:  Blood Pressure:  128/76 Sitting, Left arm, regular cuff  136/82 Sitting, Right arm,  regular cuff    Weight: 181.20 lbs.  Height: 64"   BMI: 31   Pulse: 63/min. Apical Regular   Respirations:  20/min.       Constitutional: Well developed, well nourished, in no acute distress  Skin: rash dense erythema induration R forearm with vesicular rash R upper arm, L wrist Head:  normocephalic, normal female hair pattern Eyes: EOMS intact, conjunctivae and lids unremarkable ENT : No pallor or cyanosis Neck: no JVD  Chest: clear to auscultation bilaterally, normal A-P diameter, normal respiratory excursion Cardiac: Regular  rhythm, S1 normal, S2 normal, No S3 or S4, no extrasystoles, Apical impulse not displaced, no murmurs Abdomen: abdomen normal  Peripheral Pulses: radial pulse(s) 2+ Extremities/Back : Normal muscle strength and tone., No clubbing, cyanosis or edema Neurological: oriented to time, person  and place   __    Medications added today by the physician:      IMPRESSIONS:    1. 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  2. Chronic bronchitis with pulmonary hypertension by last echo, but normal right heart. No signs  of heart failure.   3. Chronic bifascicular block.  4. Prior PVCs with low background burden of ectopy by last Holter worn in 2014.  5. Angiographically normal coronaries in 2013.  6. Moderate nonobstructive carotid atherosclerosis by last duplex in August 2016.   7. Hypercholesterolemia tightly controlled, but 2016 labs on high-dose atorvastatin.  8. Cephalosporin severe allergy with possible hives in the remote past.    RECOMMENDATIONS:    1. Repeat echocardiogram to ensure stable LV function and reassess her pulmonary pressures  and right heart.  2. If EF is again down or if she notes subjective palpitations p.r.n. Holter monitor request in place.  3. I urged her to  call Dr. Delia Heady of  office for followup of suspected superinfection or cellulitis in the  right forearm. In the interim should she have any further extension of the area of erythema or  low grade fever, she should seek evaluation in urgent care.    4. Broad repeat labs including CMP, lipids, and CBC.  5. Repeat carotid duplex at one year on the two year anniversary of her last study.  6. Barring any new effort intolerance or change in echo or other concerns, clinical followup with me   at one year.    Johnell Comings, MD, [facility]     Tid: [id number]    cc: Hazle Coca MD    eca   ____________________________   TODAYS ORDERS  2D, color flow, doppler At Patient Convenience  Holter Monitor prn  Return Visit 30 MIN 1 year  Lipid Panel At Patient Convenience  Comprehensive Metabolic Panel At Patient  Convenience  CBC without Differential At Patient Convenience  Magnesium At Patient Convenience  Carotid Duplex - PVL 1 year  12 Lead ECG Today  Diet_mgmt_edu,_guidance_and_counseling TODAY

## 2020-08-06 NOTE — Progress Notes (Signed)
[facility] OFFICE  [address] [place], [place] [address]     [name]    Date of Visit:  02/21/2018  Date of Birth: [birth year]  Age: 80 yrs.   Medical Record Number: [id number]  __  CURRENT DIAGNOSES     1. Primary pulmonary hypertension,  I27.0  2. Shortness Of Breath, R06.02  3. Edema, lower extremity, R60.0  4. Congestive Heart Failure, 428.0  5. Cardiomyopathy, unspecified, I42.9  6. Ventricular premature depolarization, I49.3  7. Occlusion and stenosis of bilateral  carotid arteries, I65.23  8. 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 Pre-op cardiovascular examination, Z01.810  __  ALLERGIES     Cefaclor, Hives and/or rash  __  MEDICATIONS     1. aspirin 81 mg chewable tablet, 1 tabp o  daily  2. carvedilol 25 mg tablet, 1 po bid  3. Lasix 20 mg tablet, 1 pill everyother day alternating with 2 pills everyother day  4. lisinopril 2.5 mg tablet, 1 po q am  5. magnesium oxide 400 mg (241.3 mg magnesium) tablet, 1 po qd   6. rosuvastatin 20 mg tablet, 1 po qhs  7. Spiriva with HandiHaler 18 mcg capsule, w/inhalation device, 2 puffs qam    __  HISTORY OF PRESENT ILLNESS   Sabrina Church is here for routine follow-up. She struggles with a diuretic and feels housebound especially on the days that she takes 2 pills which is every other day. She has not had any orthopnea PND or lower extremity edema and appears euvolemic today  with mild 4 pound weight loss since our April visit. She has been cutting back in her calorie intake and drinking a lot of water which may in part account for the weight loss. She has rare heart racing that breaks with bearing down sometimes triggered  by the stress of visiting her sister who has advancing dementia and a complicated home situation.  __  PAST HISTORY      Past Medical Illnesses: Asthma, COPD, pulmonary HTN;  Past Cardiac Illnesses : Arrhythmia-PSVT, Arrhythmia-PVCs, Cardiomyopathy, carotid artery disease; Infectious Diseases: No previous history of significant  infectious diseases.;  Surgical Procedures: Hysterectomy, Tubal Ligation; Trauma History: No previous history of significant trauma.;  Cardiology Procedures-Invasive: Cardiac Cath December 2013; Cardiology Procedures-Noninvasive: Echocardiogram,  MPI 02/12/12, Echocardiogram October 2013, Holter Monitor November 2013, Echocardiogram July 2014, Limited echocardiogram 08/01/13, Echocardiogram October 2017, Holter Monitor October 2018, Echocardiogram January 2019;  Cardiac Cath Results: 04/2012: Normal coronary arteries, moderate left ventricular dysfunction, ejection fraction 40%, and moderate pulmonary hypertension.;  Left Ventricular Ejection Fraction: LVEF of 55% documented via echocardiogram on 05/28/2017; Peripheral Vascular Procedures : Carotid 12/21/12, Carotid NIVA 2015, Carotid NIVA October 2018  ___  FAMILY HISTORY   Brother -- Cardiomyopathy  Father -- Myocardial infarct in 1st degree female relative 22 (MI at age 38)   Mother -- Congestive heart failure, Chronic obstructive lung disease, Type 2 Diabetes    __  CARDIAC RISK FACTORS      Tobacco Abuse: used to smoke, but quit; Family History of Heart Disease: strong family history of heart  disease, Male55 y/o; Hyperlipidemia: positive; Hypertension : negative;  Diabetes Mellitus: negative; Prior History  of Heart Disease: negative; Obesity: positive; Sedentary  Life Style:negative; [id number]; Menopausal :positive  __  SOCIAL HISTORY    Alcohol Use : Does not use alcohol; Smoking: Used to smoke but quit; Former smoker [phone]); Diet : Regular diet and Caffeine use-1-2 per day; Exercise: walking in house, fit bit  2.54miles/d, 3100 steps/d;   __   PHYSICAL EXAMINATION    Vital Signs:  Blood Pressure:   120/60 Sitting, Left arm, large cuff  130/60 Sitting, Right arm, large cuff    Weight: 164.60 lbs.   Height: 64.00"  BMI: 28.25   Pulse:  68/min. Apical Regular       Constitutional: Well developed, well nourished, in no acute distress  Skin: rash dense erythema  induration R forearm with vesicular rash R upper arm, L wrist Head:  normocephalic, normal female hair pattern Eyes: EOMS intact, conjunctivae and lids unremarkable ENT : No pallor or cyanosis Neck: no JVD  Chest: clear to auscultation bilaterally, normal A-P diameter, normal respiratory excursion Cardiac: Regular  rhythm, S1 normal, S2 normal, No S3 or S4, rare extrasystoles, 2/6 holosystolic murmur abdomen: abdomen normal  Peripheral Pulses: radial pulse(s) 2+ Extremities/Back : No edema neurological: oriented to time, person and place   __       IMPRESSIONS:  1. Chronic heart failure preserved EF with normalized EF on last echocardiogram May 28, 2017, euvolemic on current Lasix dosing  2. Prior PVC related cardiomyopathy with frequent  PVCs noted at our April follow-up and more infrequent on today's exam  3. 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 Moderate bilateral carotid atherosclerosis  50 to 69%, stable on duplex performed in our office last week October 2019  5. Intolerance to high-dose atorvastatin  6. Angiographically normal coronaries in 2013 with no interim angina or progressive effort intolerance and no interim ischemic  eval.   7. Chronic bronchitis followed by Dr. Brynda Rim    RECOMMENDATIONS:  1. Repeat Holter for 48 hours at her convenience to reassess the  burden of ectopy.  2. Continue vagal maneuvers for periodic palpitations and sooner follow-up if increased burden or new effort intolerance.  3. If recurrent significant ectopy, we will repeat her echocardiogram prior to 15-month follow-up.   4. Continue aspirin therapy and monitoring for any interim neuro events. Barring this, would ensure lipids are tightly controlled as they had been in January and repeat her carotid duplex on the anniversary of the study next October 2020.    Barring  any new internal effort intolerance or concerns, clinical  follow-up with me at 6 months.    This note was generated by the [facility] EMR system/Dragon speech recognition and may contain errors or omissions not intended by the user. Grammatical errors,  random word insertions, deletions, pronoun errors, and incomplete sentences are occasional consequences of this technology due to software limitations. Not all errors are caught or corrected. If there are questions or concerns about the content of this  note or information contained within the body of this dictation, they should be addressed directly with the author for clarification.    Johnell Comings, M.D.    cc: Hazle Coca MD  ____________________________   Christianne Dolin  Return Visit 15 MIN 6 months

## 2020-08-06 NOTE — Progress Notes (Signed)
[facility] OFFICE  [address] [place], [place] [address]     [name]    Date of Visit:  03/02/2017  Date of Birth: 09/26/1940  Age: 80 yrs.   Medical Record Number: [id number]  __  CURRENT DIAGNOSES     1. Primary pulmonary hypertension,  I27.0  2. Shortness of breath, R06.02  3. Edema, lower extremity, R60.0  4. Cardiomyopathy, unspecified, I42.9  5. Ventricular premature depolarization, I49.3  6. 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  Congestive Heart Failure, 428.0  __  ALLERGIES    Cefaclor, Hives and/or rash   __  MEDICATIONS     1. Flovent Diskus 250 mcg/actuation disk with device, 1 puff qd  2. Xopenex HFA 45 mcg/actuation HFA aerosol inhaler, prn  only  3. Spiriva with HandiHaler 18 mcg capsule, w/inhalation device, 2 puffs qam  4. magnesium oxide 400 mg tablet, 1 po qd  5. aspirin 81 mg chewable tablet, 1 tabp o daily  6. Lasix 20 mg tablet, 1 pill everyother day alternating with  2 pills everyother day  7. carvedilol 25 mg tablet, 1 po bid  8. lisinopril 2.5 mg tablet, 1 po q am  9. 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 She has had increased respiratory difficulty, but not required hospitalization. She was recently prescribed  an internasal antihistamines for postnasal drip and has not really noted improvement She has a dry, nonproductive cough and is wearing oxygen nocturnally and notes that she has to elevate the head of her bed or she desaturates. She has increased edema  by exam, but no central heart failure in her lungs appear clear. Her blood pressure is controlled and her ECG shows stable bifascicular block, with underlying sinus rhythm. She is scheduling cataract surgery in early November.  __   PAST HISTORY     Past Medical Illnesses: Asthma, COPD, pulmonary HTN;   Past  Cardiac Illnesses: Arrhythmia-PSVT, Arrhythmia-PVCs, Cardiomyopathy, carotid artery disease;  Infectious Diseases: No previous history of significant infectious diseases.; Surgical Procedures: Hysterectomy,  Tubal Ligation; Trauma History: No previous history of significant trauma.; Cardiology Procedures-Invasive : Cardiac Cath December 2013; Cardiology Procedures-Noninvasive: Echocardiogram, MPI 02/12/12, Echocardiogram October 2013, Holter Monitor November 2013,  Echocardiogram July 2014, Limited echocardiogram 08/01/13, Echocardiogram October 2017, Holter Monitor October 2018; Cardiac Cath Results: 04/2012: Normal  coronary arteries, moderate left ventricular dysfunction, ejection fraction 40%, and moderate pulmonary hypertension.; Left Ventricular Ejection Fraction : LVEF of 55% documented via echocardiogram on 02/27/2016; Peripheral Vascular Procedures: Carotid 12/21/12, Carotid NIVA 2015, Carotid NIVA October 2018   ___  FAMILY HISTORY  Brother -- Cardiomyopathy   Father -- Myocardial infarct in 1st degree female relative 66 (MI at age 66)  Mother -- Congestive heart  failure, Chronic obstructive lung disease, Type 2 Diabetes    __  CARDIAC RISK FACTORS      Tobacco Abuse: used to smoke, but quit; Family History of Heart Disease: strong family history of heart  disease, Male55 y/o; Hyperlipidemia: positive; Hypertension : negative;  Diabetes Mellitus: negative; Prior History  of Heart Disease: negative; Obesity: positive; Sedentary  Life Style:negative; [id number]; Menopausal :positive  __  SOCIAL HISTORY    Alcohol Use : Does not use alcohol; Smoking: Used to smoke but quit; Former smoker [phone] Diet :  Regular diet and Caffeine use-1-2 per day; Exercise: No regular exercise;   __  PHYSICAL EXAMINATION     Vital Signs:  Blood Pressure:  134/66 Sitting, Left arm, regular cuff  138/66 Sitting, Right arm,  regular cuff    Weight: 170.60 lbs.  Height: 64.00"   BMI: 29   Pulse: 61/min. Apical         Constitutional: Well developed, well nourished, in no acute distress Skin: rash dense erythema induration  R forearm with vesicular rash R upper arm, L wrist Head: normocephalic, normal female hair pattern  Eyes: EOMS intact, conjunctivae and lids unremarkable ENT:  No pallor or cyanosis Neck: no JVD Chest : clear to auscultation bilaterally, normal A-P diameter, normal respiratory excursion Cardiac: Regular rhythm, S1 normal, S2 normal, No S3 or S4, no extrasystoles,  Apical impulse not displaced, no murmurs Abdomen: abdomen normal Peripheral Pulses : radial pulse(s) 2+ Extremities/Back: 1+ pitting pretibial  Neurological: oriented to time, person and place   __    Medications added today by the physician:   rosuvastatin 20 mg tablet, 1 po qhs, 30  atorvastatin 80 mg tablet, 1 po QOD, 90  rosuvastatin 20 mg tablet, 1 po qhs, 90      IMPRESSIONS:    1. Preop for cataract surgery.  2. Increased dyspnea, both at baseline and nocturnally, positional desaturation, increased lower  extremity edema, but no overt central heart failure.  3. Chronic bronchitis and pulmonary hypertension  with normal right heart by last echo October 2017.  4. Prior cardiomyopathy, likely PVC related with normalized EF again last October 2017.  5. Prior PVCs, largely suppressed on the background of Coreg at approximately 2% burden on  last Holter  worn earlier this month.  6. Angiographically normal coronaries in 2013.  7. Hypercholesterolemia with moderate nonobstructive carotid atherosclerosis.  8. Leg pains, intolerance to high-dose atorvastatin.  9. CEPHALOSPORIN ALLERGY.     RECOMMENDATIONS:   1. Low risk for upcoming cataract surgery.   2. Stop Lipitor. Begin Crestor 20 mg nightly. If recurrent myalgias, can decrease to 10 mg and  should call for dose adjustment  and repeat CMP, lipids, fasting at two months.  3. Labs including basic metabolic panel and BNP in repeat echocardiogram in view of the increased  edema and positional shortness  of breath, re-evaluate LV function.  4. If the above tests, BNP  and echo are reassuring with no worsening symptoms, I will see her for  clinical followup at six months.    Johnell Comings, MD, [facility]     Tid: [id number]    cc: Hazle Coca MD  Pat Patrick MD    MG   ____________________________  TODAYS ORDERS  2D, color flow, doppler At Patient Convenience  Diet mgmt edu, guidance and counseling TODAY   WU:[id number] Education ICD-10: I27.0 MedlinePlus Connect results for ICD-10 I27.0  Lipid Panel 2 months  Comprehensive Metabolic Panel 2 months  Magnesium 2 months  TSH 2 months  Basic Metabolic Panel Today  Brain Natriuretic Peptide  Today  Return Visit 15 MIN 6 months

## 2020-08-06 NOTE — Progress Notes (Signed)
[facility] OFFICE  [address] [place], [place] [address]     [name]    Date of Visit:  08/10/2017  Date of Birth: 06/07/1940  Age: 80 yrs.   Medical Record Number: [id number]  __  CURRENT DIAGNOSES     1. Primary pulmonary hypertension,  I27.0  2. Shortness of breath, R06.02  3. Edema, lower extremity, R60.0  4. Cardiomyopathy, unspecified, I42.9  5. Ventricular premature depolarization, I49.3  6. 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 Pre-op cardiovascular examination, Z01.810  9. Congestive Heart Failure, 428.0  __  ALLERGIES     Cefaclor, Hives and/or rash  __  MEDICATIONS     1. Flovent Diskus 250 mcg/actuation disk with  device, 1 puff qd  2. Spiriva with HandiHaler 18 mcg capsule, w/inhalation device, 2 puffs qam  3. aspirin 81 mg chewable tablet, 1 tabp o daily  4. carvedilol 25 mg tablet, 1 po bid  5. Lasix 20 mg tablet, 1 pill everyother day alternating  with 2 pills everyother day  6. lisinopril 2.5 mg tablet, 1 po q am  7. magnesium oxide 400 mg (241.3 mg magnesium) tablet, 1 po qd  8. 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 She has had a difficult winter as far  as her breathing with frequent bronchitis and respiratory infection. She did not get influenza or land in the hospital and she is followed closely with Dr. Brynda Rim, who she is due to see tomorrow. She describes a chronic cough and using the incentive  spirometer chronically. On exam she had likely atelectasis with abnormal basal lung sounds that cleared with deep inspiration and cough. She has not had any signs of heart failure or volume overload. Her weight is down two pounds from her last visit in  late October. She denies any subjective palpitations and has rare irregularity on my  exam today.  __  PAST HISTORY      Past Medical Illnesses: Asthma, COPD, pulmonary HTN;  Past Cardiac Illnesses : Arrhythmia-PSVT, Arrhythmia-PVCs, Cardiomyopathy, carotid artery disease; Infectious Diseases: No previous history of significant infectious diseases.;  Surgical Procedures: Hysterectomy, Tubal Ligation; Trauma History: No previous history of significant trauma.;  Cardiology Procedures-Invasive: Cardiac Cath December 2013; Cardiology Procedures-Noninvasive: Echocardiogram,  MPI 02/12/12, Echocardiogram October 2013, Holter Monitor November 2013, Echocardiogram July 2014, Limited echocardiogram 08/01/13, Echocardiogram October 2017, Holter Monitor October 2018, Echocardiogram January 2019;  Cardiac Cath Results: 04/2012: Normal coronary arteries, moderate left ventricular dysfunction, ejection fraction 40%, and moderate pulmonary hypertension.;  Left Ventricular Ejection Fraction: LVEF of 55% documented via echocardiogram on 05/28/2017; Peripheral Vascular Procedures : Carotid 12/21/12, Carotid NIVA 2015, Carotid NIVA October 2018  ___  FAMILY HISTORY   Brother -- Cardiomyopathy  Father -- Myocardial infarct in 1st degree female relative 79 (MI at age 41)   Mother -- Congestive heart failure, Chronic obstructive lung disease, Type 2 Diabetes    SOCIAL HISTORY     Alcohol Use: Does not use alcohol; Smoking: Used to smoke but quit; Former smoker [phone]);  Diet: Regular diet and Caffeine use-1-2 per day; Exercise: walking in house, fit bit  2.68miles/d, 3100  steps/d;   __  REVIEW OF SYSTEMS    General : 3lb wt loss; Integumentary: Denies  any change in hair  or nails, rashes, or skin lesions.; Eyes: wears eye glasses/contact lenses; Ears, Nose, Throat, Mouth : Denies any hearing loss, epistaxis, hoarseness or difficulty speaking.;Respiratory:  recurrent bronchitis this winter, follows w Lamberti, seeing him tomorrow, chronic cough, nocturnal o2, no sleep apnea; Cardiovascular:  see hpi; Abdominal : 1x diarrheal  illness;Musculoskeletal :Denies any history of venous insufficiency, arthritic symptoms or back problems.; Neurological  : Denies any history of recurrent strokes, TIA, or seizure disorder.; Psychiatric: Denies any history  of depression, substance abuse or change in cognitive functions.; Endocrine: Denies any history of weight  change, heat/cold intolerance, polydipsia, or polyuria; Hematologic/Immunologic: Denies any food allergies,  seasonal allergies, bleeding disorders.  __  PHYSICAL EXAMINATION    Vital Signs :  Blood Pressure:  122/70 Sitting, Left arm, large cuff  122/70 Sitting, Right arm, large cuff     Weight: 167.80 lbs.  Height: 64.00"  BMI:  29   Pulse: 60/min.       Constitutional:  Well developed, well nourished, in no acute distress Skin: rash dense erythema induration R forearm with vesicular rash R upper arm, L wrist  Head: normocephalic, normal female hair pattern Eyes:  EOMS intact, conjunctivae and lids unremarkable ENT: No pallor or cyanosis  Neck: no JVD Chest: clear to auscultation bilaterally,  normal A-P diameter, normal respiratory excursion Cardiac: Regular rhythm, S1 normal, S2 normal, No S3 or S4, no extrasystoles, Apical impulse not displaced,  no murmurs Abdomen: abdomen normal Peripheral Pulses : radial pulse(s) 2+ Extremities/Back: 1+ pitting pretibial  Neurological: oriented to time, person and place   __    Medications added today by the physician:   carvedilol 25 mg tablet, 1 po bid, 180  Lasix 20 mg tablet, 1 pill everyother day alternating with 2 pills everyother day, 180  lisinopril 2.5 mg tablet, 1 po q am, 90  magnesium oxide 400 mg (241.3 mg magnesium) tablet, 1 po qd, 90  rosuvastatin  20 mg tablet, 1 po qhs, 90    ECG:   Her repeat 12-lead ECG shows occasional PVCs with sinus rhythm and bifascicular block.     IMPRESSIONS:   1. Recurrent dyspnea predominantly pulmonary with chronic bronchitis, followed by Dr. Brynda Rim.   2. 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   3. 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   4. Angiographically normal coronaries in 2013.   5. Hypercholesterolemia, tightly controlled by January labs, with moderate  nonobstructive carotid   atherosclerosis bilateral 50% to 69% by October 2018 duplex.   6. Intolerance to high-dose atorvastatin (leg pains).     RECOMMENDATIONS:    1. Despite the recurrent PVCs, her worsening respiratory symptoms are overwhelmingly   pulmonary. If in future close followup with Dr. Brynda Rim and her primary doctor, Dr. Delia Heady, who   have urged her to follow more closely with as she has  not seen him in quite a while, her   respiratory status has worsened or significant ectopy noted or new signs of heart failure, I   would encourage her to have a repeat Holter and earlier followup with me.   2. Repeat carotid duplex as scheduled  in the fall in October 2019, followup of her moderate   stenosis.   3. I otherwise reassured her that the Coreg is necessary for PVC suppression and that she is not   very bronchospastic  and I do not think this is contributing to her pulmonary  exacerbation.   4. In the future also a BNP with any indeterminant symptoms may prove useful. It was borderline   elevated at 173 on her late October 2018 baseline, which can serve as a baseline for [place].   5. P.r.n. order for a repeat Holter  is in place, and I have cautioned her regarding heart failure   symptoms to call for sooner followup     Given the increased ectopy and her worsening respiratory status, I think a slightly closer followup with me at six months is warranted  and sooner with a repeat p.r.n. Holter with any worsening symptoms.    Johnell Comings, MD, [facility]     Tid: [id number]:[id number]:WJX:BJ/ Tid: [id number]:MMA:SDP:SL     cc: Hazle Coca MD    sj  ____________________________  Christianne Dolin   Return Visit 30 MIN 1 year

## 2020-08-06 NOTE — Progress Notes (Signed)
[facility] OFFICE  [address] [place], [place] [address]     Sabrina Church    Date of Visit:  12/19/2014  Date of Birth: 05-Aug-1940  Age: 80 yrs.   Medical Record Number: [id number]  __  CURRENT DIAGNOSES     1. 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 Primary pulmonary hypertension, I27.0  3. Ventricular premature depolarization, I49.3  4. Cardiomyopathy, NOS, 425.4  5. Congestive Heart Failure, 428.0  6. Shortness of breath, R06.02  __   ALLERGIES    Cefaclor, Hives and/or rash  __   MEDICATIONS     1. Flovent Diskus 250 mcg/actuation disk with device, 1 puff qd  2. Xopenex HFA 45 mcg/actuation HFA aerosol inhaler, prn only  3. Spiriva with HandiHaler 18 mcg capsule, w/inhalation  device, 2 puffs qam  4. magnesium oxide 400 mg tablet, 1 po qd  5. aspirin 81 mg tablet,delayed release, 1 po qd  6. Lasix 20 mg tablet, 1 pill everyother day alternating with 2 pills everyother day  7. atorvastatin 80 mg tablet, 1 po  qd  8. carvedilol 25 mg tablet, 1 po bid  9. lisinopril 2.5 mg tablet, 1 po q am  __  CHIEF COMPLAINT/REASON FOR VISIT  Followup of Carotid  Artery Dz w/o Cerebral  __  HISTORY OF PRESENT ILLNESS  Itati Brocksmith presents for six-month followup. She is mainly limited in her symptoms by her  lung disease, though she reports that her dyspnea is stable. She denies all other cardiac complaints at this time. Specifically, she denies chest pain, palpitations, orthopnea, PND, presyncope, or syncope.   __   PAST HISTORY     Past Medical Illnesses: Asthma, COPD, pulmonary HTN;   Past Cardiac Illnesses: Arrhythmia-PSVT, Arrhythmia-PVCs, Cardiomyopathy, carotid artery disease;  Infectious Diseases: No previous history of significant infectious diseases.; Surgical Procedures: Hysterectomy,  Tubal Ligation; Trauma History: No previous history of significant trauma.; Cardiology Procedures-Invasive : Cardiac Cath December 2013; Cardiology Procedures-Noninvasive: Echocardiogram, MPI 02/12/12, Echocardiogram  October 2013, Holter Monitor November 2013,  Echocardiogram July 2014, Limited echocardiogram 08/01/13; Cardiac Cath Results: 04/2012: Normal coronary arteries, moderate left ventricular dysfunction,  ejection fraction 40%, and moderate pulmonary hypertension.; Left Ventricular Ejection Fraction: LVEF of 50% documented via Limited echocardiogram on 08/01/2013;  Peripheral Vascular Procedures: Carotid 12/21/12, Carotid NIVA 2015  ___   FAMILY HISTORY  Brother -- Cardiomyopathy  Father  -- Myocardial infarct in 1st degree female relative 103 (MI at age 34)  Mother -- Congestive heart failure,  Chronic obstructive lung disease, Type 2 Diabetes    __  CARDIAC RISK FACTORS     Tobacco  Abuse: used to smoke, but quit; Family History of Heart Disease: strong family history of heart disease,  Male55 y/o; Hyperlipidemia: positive; Hypertension:  negative;  Diabetes Mellitus: negative; Prior History  of Heart Disease: negative; Obesity: positive; Sedentary  Life Style:negative; TDV:VOHYWVPX; Menopausal :positive  __  SOCIAL HISTORY    Alcohol Use : Does not use alcohol; Smoking: Used to smoke but quit; Former smoker [phone]); Diet : Regular diet and Caffeine use-1-2 per day; Exercise: No regular exercise;     PHYSICAL EXAMINATION     Vital Signs:  Blood Pressure:  100/70 Sitting, Left arm, large cuff  110/70 Sitting, Right arm,  large cuff    Weight: 176.00 lbs.  Height: 64"   BMI: 30   Pulse: 60/min.   Respirations:  16/min.       Constitutional: Well developed, well nourished, in  no acute distress  Skin: warm and dry to touch, no apparent skin lesions or masses noted Head:  normocephalic, normal female hair pattern Eyes: EOMS intact, conjunctivae and lids unremarkable ENT : No pallor or cyanosis Neck: no JVD  Chest: clear to auscultation bilaterally Cardiac: Regular rhythm, S1 normal, S2 normal, No S3 or S4, no  extrasystoles, Apical impulse not displaced, no murmurs Abdomen: abdomen normal Peripheral Pulses : radial pulse(s)  2+ Extremities/Back: Normal muscle strength and tone., No clubbing, cyanosis or edema  Neurological: oriented to time, person and place   __    Medications added today by the physician:         IMPRESSIONS:  1. Carotid artery disease with moderate plaque burden and 50% to 69% stenoses bilaterally.  Repeat carotid ultrasound August 2016 with less than 50% stenosis bilaterally with an inability   to reproduce prior criteria suggesting more moderate 50% to 69% stenosis.  2. Chronic bronchitis followed by Dr. Brynda Rim.  3. Moderate pulmonary hypertension with normal right ventricular size and function on  echocardiogram March 2015.   4. 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  5. Frequent PVCs with well-controlled symptoms on maximal dose of Coreg.  6. Chronic bifascicular block.  7. Angiographically normal coronaries 2013.     RECOMMENDATIONS:  1. She will continue beta-blocker therapy for PVC suppression. She was last seen by Dr.  Marianna Fuss in December of 2015 and going forward will see him only as needed.  2. We will  repeat a duplex evaluation of her carotid arteries in 12 months for reassessment given  fluctuating criteria for borderline 50% to 69% stenosis.  3. Her LDL is at goal, and she will continue high-intensity statin therapy. She will also continue   aspirin.  4. Barring any progressive symptoms or concerns, she will return for clinical followup in one year  or sooner if needed.    Myrle Wanek A. Lance Bosch, MD     Tid: [id number]:[id number]     cc: Hazle Coca MD    ECA   ____________________________  Christianne Dolin  Return Visit 30 MIN 1 year with PPS  Diet mgmt edu, guidance  and counseling TODAY

## 2020-08-13 ENCOUNTER — Encounter (HOSPITAL_COMMUNITY): Payer: Self-pay | Admitting: *Deleted

## 2020-08-14 ENCOUNTER — Other Ambulatory Visit: Payer: Self-pay

## 2020-08-14 ENCOUNTER — Encounter (HOSPITAL_COMMUNITY): Payer: Self-pay | Admitting: Internal Medicine

## 2020-08-14 ENCOUNTER — Ambulatory Visit (HOSPITAL_COMMUNITY)
Admission: RE | Admit: 2020-08-14 | Discharge: 2020-08-14 | Disposition: A | Discharge status: Home / Self Care | Payer: Medicare Other | Source: Ambulatory Visit | Attending: Internal Medicine | Admitting: Internal Medicine

## 2020-08-14 VITALS — BP 122/78 | HR 62 | Wt 126.2 lb

## 2020-08-14 DIAGNOSIS — I6523 Occlusion and stenosis of bilateral carotid arteries: Secondary | ICD-10-CM

## 2020-08-14 DIAGNOSIS — Z7951 Long term (current) use of inhaled steroids: Secondary | ICD-10-CM | POA: Insufficient documentation

## 2020-08-14 DIAGNOSIS — Z881 Allergy status to other antibiotic agents status: Secondary | ICD-10-CM | POA: Diagnosis not present

## 2020-08-14 DIAGNOSIS — Z880 Allergy status to penicillin: Secondary | ICD-10-CM | POA: Insufficient documentation

## 2020-08-14 DIAGNOSIS — I5022 Chronic systolic (congestive) heart failure: Secondary | ICD-10-CM | POA: Insufficient documentation

## 2020-08-14 DIAGNOSIS — I493 Ventricular premature depolarization: Secondary | ICD-10-CM | POA: Insufficient documentation

## 2020-08-14 DIAGNOSIS — Z9981 Dependence on supplemental oxygen: Secondary | ICD-10-CM | POA: Insufficient documentation

## 2020-08-14 DIAGNOSIS — I110 Hypertensive heart disease with heart failure: Secondary | ICD-10-CM | POA: Diagnosis not present

## 2020-08-14 DIAGNOSIS — Z8249 Family history of ischemic heart disease and other diseases of the circulatory system: Secondary | ICD-10-CM | POA: Insufficient documentation

## 2020-08-14 DIAGNOSIS — J449 Chronic obstructive pulmonary disease, unspecified: Secondary | ICD-10-CM | POA: Diagnosis not present

## 2020-08-14 DIAGNOSIS — Z87891 Personal history of nicotine dependence: Secondary | ICD-10-CM | POA: Insufficient documentation

## 2020-08-14 DIAGNOSIS — Z79899 Other long term (current) drug therapy: Secondary | ICD-10-CM | POA: Diagnosis not present

## 2020-08-14 DIAGNOSIS — Z7982 Long term (current) use of aspirin: Secondary | ICD-10-CM | POA: Insufficient documentation

## 2020-08-14 DIAGNOSIS — J8410 Pulmonary fibrosis, unspecified: Secondary | ICD-10-CM | POA: Diagnosis not present

## 2020-08-14 DIAGNOSIS — I6529 Occlusion and stenosis of unspecified carotid artery: Secondary | ICD-10-CM | POA: Diagnosis not present

## 2020-08-14 HISTORY — DX: Heart failure, unspecified: I50.9

## 2020-08-14 LAB — BASIC METABOLIC PANEL
Anion gap: 7 (ref 5–15)
BUN: 10 mg/dL (ref 8–23)
CO2: 32 mmol/L (ref 22–32)
Calcium: 9.2 mg/dL (ref 8.9–10.3)
Chloride: 99 mmol/L (ref 98–111)
Creatinine, Ser: 0.7 mg/dL (ref 0.44–1.00)
GFR, Estimated: >60 mL/min (ref >60)
Glucose, Bld: 107 mg/dL — ABNORMAL HIGH (ref 70–99)
Potassium: 4.1 mmol/L (ref 3.5–5.1)
Sodium: 138 mmol/L (ref 135–145)

## 2020-08-14 LAB — CBC
HCT: 42.7 % (ref 36.0–46.0)
Hemoglobin: 12.8 g/dL (ref 12.0–15.0)
MCH: 27.9 pg (ref 26.0–34.0)
MCHC: 30 g/dL (ref 30.0–36.0)
MCV: 93 fL (ref 80.0–100.0)
Platelets: 190 10*3/uL (ref 150–400)
RBC: 4.59 MIL/uL (ref 3.87–5.11)
RDW: 14.4 % (ref 11.5–15.5)
WBC: 11.3 10*3/uL — ABNORMAL HIGH (ref 4.0–10.5)
nRBC: 0 % (ref 0.0–0.2)

## 2020-08-14 MED ORDER — BISOPROLOL FUMARATE 10 MG PO TABS: 10.0000 mg | ORAL_TABLET | Freq: Every day | ORAL | 3 refills | Status: DC

## 2020-08-14 NOTE — Addendum Note (Signed)
Encounter addended by: Dolores Patty, MD on: 08/14/2020 11:27 PM  Actions taken: Level of Service modified, Visit diagnoses modified, Clinical Note Signed

## 2020-08-14 NOTE — Patient Instructions (Signed)
Stop Carvedilol  Start Bisoprolol 10 mg Daily  Labs done today, we will call you for abnormal results  A chest x-ray takes a picture of the organs and structures inside the chest, including the heart, lungs, and blood vessels. This test can show several things, including, whether the heart is enlarges; whether fluid is building up in the lungs; and whether pacemaker / defibrillator leads are still in place.  You have been referred to Dr Chestine Spore at [facility], her office will call you for an appointment  Your physician recommends that you schedule a follow-up appointment in: 1-2 months with an echocardiogram  If you have any questions or concerns before your next appointment please send [place] a message through [facility] or call our office at [phone].    TO LEAVE A MESSAGE FOR THE NURSE SELECT OPTION 2, PLEASE LEAVE A MESSAGE INCLUDING: . YOUR NAME . DATE OF BIRTH . CALL BACK NUMBER . REASON FOR CALL**this is important as we prioritize the call backs  YOU WILL RECEIVE A CALL BACK THE SAME DAY AS LONG AS YOU CALL BEFORE 4:00 PM  At the [facility], you and your health needs are our priority. As part of our continuing mission to provide you with exceptional heart care, we have created designated Provider Care Teams. These Care Teams include your primary Cardiologist (physician) and Advanced Practice Providers (APPs- Physician Assistants and Nurse Practitioners) who all work together to provide you with the care you need, when you need it.   You may see any of the following providers on your designated Care Team at your next follow up: Marland Kitchen Dr Arvilla Meres . Dr Marca Ancona . Dr Thornell Mule . Tonye Becket, NP . Robbie Lis, PA . Shanda Bumps [place],NP . Karle Plumber, PharmD   Please be sure to bring in all your medications bottles to every appointment.

## 2020-08-14 NOTE — Progress Notes (Addendum)
ADVANCED HF CLINIC CONSULT NOTE  Referring Physician: Dr. Payton Doughty   Primary Care: Dr. Payton Doughty  Primary Cardiologist: Formerly Gunnar [facility]) - relocated to [place] 07/2020  HPI:  Joan Smith is an 80 yo woman (family friend of Joan Smith in cath lab) referred by Dr. 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   Her husband died in 03-05-2012. Shortly after that admitted with acute HF with 25 pounds of fluid.   Echo 2013 EF 35% mild to moderate TR. Cath 2013 no CAD Felt to be Tako-tsubo vs PVC-mediated  Holter 1/12 PVC burden 19.7% Holter 11/13 PVC burden 21% Holter  11/21 PVC burden 4% (no ablation or AAD)   Echo 2021 EF 55% moderate MR mild PAH G2DD  Admitted in 1/22 with sepsis/PNA.   Previously managed at [facility] in [place] - relocated to [place] 07/2020 to live with her son. Says she is doing pretty well. Purposely lost 30 pounds about 2 years ago. Very independent. Does all ADLs without problem. Dyspnea with mild to moderate exertion. Follows O2 sats closely. No CP, edema, orthopnea or PND. Was on lisinopril but stopped due to very low BP. Very fatigued. Lasix makes her pee all the time. + wheezing   Review of Systems: [y] = yes, [ ]  = no   General: Weight gain [ ] ; Weight loss [ ] ; Anorexia [ ] ; Fatigue ]; Fever [ ] ; Chills [ ] ; Weakness [ ]   Cardiac: Chest pain/pressure [ ] ; Resting SOB [ ] ; Exertional SOB [ y]; Orthopnea [ ] ; Pedal Edema [ ] ; Palpitations [ ] ; Syncope [ ] ; Presyncope [ ] ; Paroxysmal nocturnal dyspnea[ ]   Pulmonary: Cough [ ] ; ]; Hemoptysis[ ] ; Sputum [ ] ; Snoring [ ]   GI: Vomiting[ ] ; Dysphagia[ ] ; Melena[ ] ; Hematochezia [ ] ; Heartburn[ ] ; Abdominal pain [ ] ; Constipation [ ] ; Diarrhea [ ] ; BRBPR  [ ]   GU: Hematuria[ ] ; Dysuria [ ] ; Nocturia[ ]   Vascular: Pain in legs with walking [ ] ; Pain in feet with lying flat [ ] ; Non-healing sores [ ] ; Stroke [ ] ; TIA [ ] ; Slurred speech [ ] ;  Neuro: Headaches[ ] ; Vertigo[ ] ; Seizures[ ] ; Paresthesias[ ] ;Blurred vision [ ] ; Diplopia [ ] ; Vision changes [ ]   Ortho/Skin: Arthritis ]; Joint pain ]; Muscle pain [ ] ; Joint swelling [ ] ; Back Pain [ ] ; Rash [ ]   Psych: Depression[ ] ; Anxiety[ ]   Heme: Bleeding problems [ ] ; Clotting disorders [ ] ; Anemia [ ]   Endocrine: Diabetes [ ] ; Thyroid dysfunction[ ]    Past Medical History:  Diagnosis Date  . Asthma   . Broken heart syndrome 2013  . Carotid arterial disease (HCC)   . CHF (congestive heart failure) (HCC)   . COPD (chronic obstructive pulmonary disease) (HCC)   . Hypertension   . OSA (obstructive sleep apnea) 2014  . Paroxysmal SVT (supraventricular tachycardia) (HCC)   . Pulmonary fibrosis (HCC)   . Pulmonary hypertension (HCC) 10/27/2019  . PVC (premature ventricular contraction)   . 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 Ventricular tachycardia (HCC) 10/27/2019    Current Outpatient Medications  Medication Sig Dispense Refill  . aspirin 81 MG EC tablet Take 81 mg by mouth daily.     azelastine (ASTELIN) 0.1 % nasal spray Place 2 sprays into the nose in the morning and at bedtime.    . carvedilol (COREG) 25 MG tablet Take 1 tablet by mouth 2 (two) times daily.    . fluticasone (FLOVENT DISKUS) 50 MCG/BLIST diskus inhaler Inhale 2 puffs into the lungs in the morning and at bedtime.    . furosemide (LASIX) 20 MG tablet Take 20 mg by mouth daily.    Marland Kitchen levalbuterol (XOPENEX HFA) 45 MCG/ACT inhaler Inhale into the lungs.    . magnesium oxide (MAG-OX) 400 MG tablet Take 400 mg by mouth daily.    . rosuvastatin (CRESTOR) 20 MG tablet Take 1 tablet by mouth at bedtime.    Marland Kitchen tiotropium (SPIRIVA) 18 MCG inhalation capsule Place 18 mcg into inhaler and inhale daily.     No  current facility-administered medications for this encounter.    Allergies  Allergen Reactions  . Amoxicillin Diarrhea and Nausea And Vomiting  . Ceclor [Cefaclor] Hives and Rash      Social History   Socioeconomic History  . Marital status: Unknown    Spouse name: Not on file  . Number of children: Not on file  . Years of education: Not on file  . Highest education level: Not on file  Occupational History  . Not on file  Tobacco Use  . Smoking status: Former Smoker    Years: 60.00    Quit date: 05/05/1999    Years since quitting: 21.2  . Smokeless tobacco: Never Used  Substance and Sexual Activity  . Alcohol use: Never  . Drug use: Never  . Sexual activity: Not on file  Other Topics Concern  . Not on file  Social History Narrative  . Not on file   Social Determinants of Health   Financial Resource Strain: Not on file  Food Insecurity: Not on file  Transportation Needs: Not on file  Physical Activity: Not on file  Stress: Not on file  Social Connections: Not on file  Intimate Partner Violence: Not on file      Family History  Problem Relation Age of Onset  . COPD Mother   . Diabetes Mellitus II Mother   . CAD Mother   . Heart failure Mother   . CAD Father   . Heart attack Father   . CAD Sister   . Cardiomyopathy Brother     Vitals:   08/14/20 1147  BP: 122/78  Pulse: 62  SpO2: 93%  Weight: 57.2 kg (126 lb 3.2 oz)    PHYSICAL EXAM: General:  Elderly Well appearing. No respiratory difficulty HEENT: normal + xanthelasma Neck: supple. no JVD. Carotids 2+ bilat; no bruits. No lymphadenopathy or thryomegaly appreciated. Cor: PMI nondisplaced. Regular rate & rhythm. No rubs, gallops or murmurs. Lungs: coarse. Mild wheeze Abdomen: soft, nontender, nondistended. No hepatosplenomegaly. No bruits or masses. Good bowel sounds. Extremities: no cyanosis, clubbing, rash, edema Neuro: alert & oriented x 3, cranial nerves grossly intact. moves all 4 extremities  w/o difficulty. Affect pleasant.  ECG: NSR 60 RBBB LVH. LAFB (bifascicular block) Personally reviewed    ASSESSMENT & PLAN:  1. Systolic HF with recovered EF - Initial event certainly sounds suspicious for TakoTsubo following the death of her husband in August 31, 2011 but subsequently ay have had a component ofPVC-related CM - Echo 2011-08-31 EF 35% mild to moderate TR.  - Cath 2011-08-31 no CAD Felt to be Tako-tsubo vs PVC-mediated - Holter 1/12 PVC burden 19.7% - Holter 11/13 PVC burden 21% -  Holter  11/21 PVC burden 4% - Echo 2021 EF 55% moderate MR mild PAH G2DD - NYHA II-III  - Volume status looks good on lasix 20 daily  - On carvedilol 25 bid which is likely helping to suppress PVCs but contributing to fatigue and wheezing. We discussed options in detail and have decided to switch her to more B1 selective agent bisoprolol 10. - BP has not tolerated lisinopril  - At next visit will try to switch lasix to spiro 12.5 - No SGLT2i at this point with complaints of frequent urination - Will obtain echo here for baseline  2. Frequent PVCs - Holters as above. Burden much improved. Need to follow  3. Carotid artery stenosis - nonobstructive - continue ECASA/statin  4. 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

## 2020-09-04 ENCOUNTER — Other Ambulatory Visit (HOSPITAL_COMMUNITY): Payer: Self-pay | Admitting: *Deleted

## 2020-09-05 ENCOUNTER — Other Ambulatory Visit: Payer: Self-pay

## 2020-09-05 ENCOUNTER — Encounter: Payer: Self-pay | Admitting: Pulmonary Disease

## 2020-09-05 ENCOUNTER — Ambulatory Visit (INDEPENDENT_AMBULATORY_CARE_PROVIDER_SITE_OTHER): Payer: Medicare Other | Admitting: Pulmonary Disease

## 2020-09-05 VITALS — BP 116/74 | HR 55 | Ht 63.0 in | Wt 124.2 lb

## 2020-09-05 DIAGNOSIS — R0602 Shortness of breath: Secondary | ICD-10-CM

## 2020-09-05 DIAGNOSIS — J440 Chronic obstructive pulmonary disease with acute lower respiratory infection: Secondary | ICD-10-CM | POA: Diagnosis not present

## 2020-09-05 DIAGNOSIS — R0609 Other forms of dyspnea: Secondary | ICD-10-CM

## 2020-09-05 DIAGNOSIS — J441 Chronic obstructive pulmonary disease with (acute) exacerbation: Secondary | ICD-10-CM

## 2020-09-05 DIAGNOSIS — R0600 Dyspnea, unspecified: Secondary | ICD-10-CM | POA: Diagnosis not present

## 2020-09-05 DIAGNOSIS — J209 Acute bronchitis, unspecified: Secondary | ICD-10-CM

## 2020-09-05 MED ORDER — DOXYCYCLINE HYCLATE 100 MG PO TABS: 100.0000 mg | ORAL_TABLET | Freq: Two times a day (BID) | ORAL | 0 refills | Status: DC

## 2020-09-05 MED ORDER — PREDNISONE 10 MG PO TABS: ORAL_TABLET | ORAL | 0 refills | Status: DC

## 2020-09-05 MED ORDER — TRELEGY ELLIPTA 100-62.5-25 MCG/INH IN AEPB
1.0000 | INHALATION_SPRAY | Freq: Every day | RESPIRATORY_TRACT | 0 refills | Status: DC
Start: 2020-09-05 — End: 2020-10-03

## 2020-09-05 MED ORDER — TRELEGY ELLIPTA 100-62.5-25 MCG/INH IN AEPB: 1.0000 | INHALATION_SPRAY | Freq: Every day | RESPIRATORY_TRACT | 3 refills | Status: DC

## 2020-09-05 NOTE — Addendum Note (Signed)
Addended by: Wyvonne Lenz on: 09/05/2020 03:20 PM   Modules accepted: Orders

## 2020-09-05 NOTE — Progress Notes (Signed)
Synopsis: Referred in May 2022 for COPD by Bensimhon, Bevelyn Buckles, MD  Subjective:   PATIENT ID: Joan Smith GENDER: female DOB: 08/23/1940, MRN: [id number]  Chief Complaint  Patient presents with  . Consult    Pt is being referred by PCP due to having COPD and asthma. Pt recently moved from [place] and is coming here to establish care. Pt has complaints of cough, SOB, and chest tightness and also has wheezing.    PMH of COPD, asthma, CAD, PH, HTN. She was in the hospital for most of her childhood for asthma exacerbations on and off. Former smoker, 40+ years, 1.5 ppds. She has nocturnal hypoxemia and uses O2.  Past 2 weeks has had increased shortness of breath, cough sputum production.  She has had follow-up CT scans that were completed at in [place].  She had [facility] radiology center disc that she brought with her today.  We will have these images uploaded.  From respiratory standpoint she feels worse over the past week.  Thick sputum production.  No blood.  Denies hemoptysis   Past Medical History:  Diagnosis Date  . Asthma   . Broken heart syndrome 2013  . Carotid arterial disease (HCC)   . CHF (congestive heart failure) (HCC)   . COPD (chronic obstructive pulmonary disease) (HCC)   . Hypertension   . OSA (obstructive sleep apnea) 2014  . Paroxysmal SVT (supraventricular tachycardia) (HCC)   . Pulmonary fibrosis (HCC)   . Pulmonary hypertension (HCC) 10/27/2019  . PVC (premature ventricular contraction)   . 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 Ventricular tachycardia (HCC) 10/27/2019     Family History  Problem Relation Age of Onset  . COPD Mother   . Diabetes Mellitus II Mother   . CAD Mother   . Heart failure Mother   . CAD Father   . Heart attack Father   . CAD Sister   . Cardiomyopathy Brother      Past Surgical History:  Procedure Laterality Date  . ABDOMINAL HYSTERECTOMY      Social History   Socioeconomic History  . Marital status: Unknown     Spouse name: Not on file  . Number of children: Not on file  . Years of education: Not on file  . Highest education level: Not on file  Occupational History  . Not on file  Tobacco Use  . Smoking status: Former Smoker    Packs/day: 1.50    Years: 50.00    Pack years: 75.00    Types: Cigarettes    Quit date: 05/05/1999    Years since quitting: 21.3  . Smokeless tobacco: Never Used  Substance and Sexual Activity  . Alcohol use: Never  . Drug use: Never  . Sexual activity: Not on file  Other Topics Concern  . Not on file  Social History Narrative  . Not on file   Social Determinants of Health   Financial Resource Strain: Not on file  Food Insecurity: Not on file  Transportation Needs: Not on file  Physical Activity: Not on file  Stress: Not on file  Social Connections: Not on file  Intimate Partner Violence: Not on file     Allergies  Allergen Reactions  . Amoxicillin Diarrhea and Nausea And Vomiting  . Ceclor [Cefaclor] Hives and Rash     Outpatient Medications Prior to Visit  Medication Sig Dispense Refill  . aspirin 81 MG EC tablet Take 81 mg by mouth daily.    Marland Kitchen azelastine (ASTELIN) 0.1 %  nasal spray Place 2 sprays into the nose in the morning and at bedtime.    . bisoprolol (ZEBETA) 10 MG tablet Take 1 tablet (10 mg total) by mouth daily. 30 tablet 3  . fluticasone (FLOVENT DISKUS) 50 MCG/BLIST diskus inhaler Inhale 2 puffs into the lungs in the morning and at bedtime.    . furosemide (LASIX) 20 MG tablet Take 20 mg by mouth daily.    Marland Kitchen levalbuterol (XOPENEX HFA) 45 MCG/ACT inhaler Inhale into the lungs.    . magnesium oxide (MAG-OX) 400 MG tablet Take 400 mg by mouth daily.    . rosuvastatin (CRESTOR) 20 MG tablet Take 1 tablet by mouth at bedtime.    Marland Kitchen tiotropium (SPIRIVA) 18 MCG inhalation capsule Place 18 mcg into inhaler and inhale daily.     No facility-administered medications prior to visit.    Review of Systems  Constitutional: Negative for chills,  fever, malaise/fatigue and weight loss.  HENT: Negative for hearing loss, sore throat and tinnitus.   Eyes: Negative for blurred vision and double vision.  Respiratory: Positive for cough, sputum production and shortness of breath. Negative for hemoptysis, wheezing and stridor.   Cardiovascular: Negative for chest pain, palpitations, orthopnea, leg swelling and PND.  Gastrointestinal: Negative for abdominal pain, constipation, diarrhea, heartburn, nausea and vomiting.  Genitourinary: Negative for dysuria, hematuria and urgency.  Musculoskeletal: Negative for joint pain and myalgias.  Skin: Negative for itching and rash.  Neurological: Negative for dizziness, tingling, weakness and headaches.  Endo/Heme/Allergies: Negative for environmental allergies. Does not bruise/bleed easily.  Psychiatric/Behavioral: Negative for depression. The patient is not nervous/anxious and does not have insomnia.   All other systems reviewed and are negative.    Objective:  Physical Exam Vitals reviewed.  Constitutional:      General: She is not in acute distress.    Appearance: She is well-developed.  HENT:     Head: Normocephalic and atraumatic.  Eyes:     General: No scleral icterus.    Conjunctiva/sclera: Conjunctivae normal.     Pupils: Pupils are equal, round, and reactive to light.  Neck:     Vascular: No JVD.     Trachea: No tracheal deviation.  Cardiovascular:     Rate and Rhythm: Normal rate and regular rhythm.     Heart sounds: Normal heart sounds. No murmur heard.   Pulmonary:     Effort: Pulmonary effort is normal. No tachypnea, accessory muscle usage or respiratory distress.     Breath sounds: No stridor. Wheezing and rales present. No rhonchi.  Abdominal:     General: Bowel sounds are normal. There is no distension.     Palpations: Abdomen is soft.     Tenderness: There is no abdominal tenderness.  Musculoskeletal:        General: No tenderness.     Cervical back: Neck supple.   Lymphadenopathy:     Cervical: No cervical adenopathy.  Skin:    General: Skin is warm and dry.     Capillary Refill: Capillary refill takes less than 2 seconds.     Findings: No rash.  Neurological:     Mental Status: She is alert and oriented to person, place, and time.  Psychiatric:        Behavior: Behavior normal.      Vitals:   09/05/20 1408  BP: 116/74  Pulse: (!) 55  SpO2: 93%  Weight: 124 lb 3.2 oz (56.3 kg)  Height: 5\' 3"  (1.6 m)   93% on RA  BMI Readings from Last 3 Encounters:  09/05/20 22.00 kg/m   Wt Readings from Last 3 Encounters:  09/05/20 124 lb 3.2 oz (56.3 kg)  08/14/20 126 lb 3.2 oz (57.2 kg)     CBC    Component Value Date/Time   WBC 11.3 (H) 08/14/2020 1253   RBC 4.59 08/14/2020 1253   HGB 12.8 08/14/2020 1253   HCT 42.7 08/14/2020 1253   PLT 190 08/14/2020 1253   MCV 93.0 08/14/2020 1253   MCH 27.9 08/14/2020 1253   MCHC 30.0 08/14/2020 1253   RDW 14.4 08/14/2020 1253    Chest Imaging:  08/14/2020: Hyper-expanded, lung fields, no infiltrates The patient's images have been independently reviewed by me.    Pulmonary Functions Testing Results: No flowsheet data found.  FeNO:   Pathology:   Echocardiogram:   Heart Catheterization:     Assessment & Plan:     ICD-10-CM   1. Acute exacerbation of chronic obstructive pulmonary disease (COPD) (HCC)  J44.1   2. Acute bronchitis with COPD (HCC)  J44.0    J20.9   3. SOB (shortness of breath)  R06.02   4. DOE (dyspnea on exertion)  R06.00     Discussion:  80 year old female, history of COPD, history of asthma.  Former smoker 40+ years.  Presents today with acute exacerbation of COPD.  She has cough shortness of breath sputum production.  Her chest exam has significant rales on the right side compared to the left.  Office note from heart failure clinic reviewed.  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  Plan: Start  prednisone taper Start 7 days doxycycline Continue as needed albuterol Start Trelegy samples New prescription for Trelegy 100.  Return to clinic in 4 weeks to make sure she is still improving or recovered.   Current Outpatient Medications:  .  aspirin 81 MG EC tablet, Take 81 mg by mouth daily., Disp: , Rfl:  .  azelastine (ASTELIN) 0.1 % nasal spray, Place 2 sprays into the nose in the morning and at bedtime., Disp: , Rfl:  .  bisoprolol (ZEBETA) 10 MG tablet, Take 1 tablet (10 mg total) by mouth daily., Disp: 30 tablet, Rfl: 3 .  fluticasone (FLOVENT DISKUS) 50 MCG/BLIST diskus inhaler, Inhale 2 puffs into the lungs in the morning and at bedtime., Disp: , Rfl:  .  furosemide (LASIX) 20 MG tablet, Take 20 mg by mouth daily., Disp: , Rfl:  .  levalbuterol (XOPENEX HFA) 45 MCG/ACT inhaler, Inhale into the lungs., Disp: , Rfl:  .  magnesium oxide (MAG-OX) 400 MG tablet, Take 400 mg by mouth daily., Disp: , Rfl:  .  rosuvastatin (CRESTOR) 20 MG tablet, Take 1 tablet by mouth at bedtime., Disp: , Rfl:  .  tiotropium (SPIRIVA) 18 MCG inhalation capsule, Place 18 mcg into inhaler and inhale daily., Disp: , Rfl:    Josephine Igo, DO [facility] 09/05/2020 2:25 PM

## 2020-09-05 NOTE — Patient Instructions (Signed)
Thank you for visiting Dr. Tonia Brooms at [facility]. Today we recommend the following:  Meds ordered this encounter  Medications  . predniSONE (DELTASONE) 10 MG tablet    Sig: Take 4 tabs by mouth once daily x4 days, then 3 tabs x4 days, 2 tabs x4 days, 1 tab x4 days and stop.    Dispense:  40 tablet    Refill:  0  . doxycycline (VIBRA-TABS) 100 MG tablet    Sig: Take 1 tablet (100 mg total) by mouth 2 (two) times daily.    Dispense:  14 tablet    Refill:  0   Samples of Trelegy 100mg  daily  Return in about 4 weeks (around 10/03/2020).    Please do your part to reduce the spread of COVID-19.

## 2020-09-06 ENCOUNTER — Ambulatory Visit
Admission: RE | Admit: 2020-09-06 | Discharge: 2020-09-06 | Disposition: A | Discharge status: Home / Self Care | Payer: Self-pay | Source: Ambulatory Visit | Attending: Pulmonary Disease | Admitting: Pulmonary Disease

## 2020-09-06 DIAGNOSIS — R0602 Shortness of breath: Secondary | ICD-10-CM

## 2020-09-06 DIAGNOSIS — J209 Acute bronchitis, unspecified: Secondary | ICD-10-CM

## 2020-09-06 DIAGNOSIS — J440 Chronic obstructive pulmonary disease with acute lower respiratory infection: Secondary | ICD-10-CM

## 2020-09-06 NOTE — Addendum Note (Signed)
Addended by: Wyvonne Lenz on: 09/06/2020 09:05 AM   Modules accepted: Orders

## 2020-09-09 ENCOUNTER — Ambulatory Visit (INDEPENDENT_AMBULATORY_CARE_PROVIDER_SITE_OTHER): Payer: Medicare Other | Admitting: Cardiovascular Disease

## 2020-10-03 ENCOUNTER — Encounter: Payer: Self-pay | Admitting: Adult Health

## 2020-10-03 ENCOUNTER — Ambulatory Visit (INDEPENDENT_AMBULATORY_CARE_PROVIDER_SITE_OTHER): Payer: Medicare Other | Admitting: Adult Health

## 2020-10-03 ENCOUNTER — Other Ambulatory Visit: Payer: Self-pay

## 2020-10-03 DIAGNOSIS — J449 Chronic obstructive pulmonary disease, unspecified: Secondary | ICD-10-CM | POA: Insufficient documentation

## 2020-10-03 DIAGNOSIS — J9611 Chronic respiratory failure with hypoxia: Secondary | ICD-10-CM | POA: Insufficient documentation

## 2020-10-03 DIAGNOSIS — I6523 Occlusion and stenosis of bilateral carotid arteries: Secondary | ICD-10-CM | POA: Diagnosis not present

## 2020-10-03 NOTE — Assessment & Plan Note (Signed)
Recent COPD exacerbation now resolved.  Patient is continue on triple therapy.  Check PFTs on return.  Activity as tolerated  Plan  . Patient Instructions  Continue on Trelegy 1 puff daily, rinse after use  Continue on Oxygen 2 L at bedtime Activity as tolerated.  Follow-up in 3-4  months with PFTs with Dr. Tonia Brooms and As needed

## 2020-10-03 NOTE — Progress Notes (Signed)
@Patient  ID: Lanagan, female    DOB: Jul 05, 1940, 80 y.o.   MRN: [id number]  Chief Complaint  Patient presents with  . Follow-up    Referring provider: [id number], MD  HPI: 80 year old female former smoker seen for pulmonary consult Sep 05, 2020 to establish for COPD and asthma.  Has oxygen dependent respiratory failure with nocturnal oxygen at 2 L. Does use on oxygen with activity on occasion .  Medical history significant for cardiomyopathy  TEST/EVENTS :    10/03/2020 Follow up : COPD Patient presents for a 1 month follow-up.  Patient was seen last visit to establish for COPD and asthma.  Patient also is on oxygen 2 L at bedtime.  Patient recently moved from 12/03/2020 to be closer to kids.  Last visit was having a COPD exacerbation.  Was treated with a course of antibiotics with doxycycline for 7 days and a prednisone taper.  She was also started on Trelegy inhaler.  Since last visit patient feels better with decreased cough and congestion .  She says overall she feels that she is starting to do better.  She does get short of breath with heavy activities but just breaks up her activities and rest in between.  Living in apartment close to kids. Does own house chores. Does own shopping . Drives .      Allergies  Allergen Reactions  . Amoxicillin Diarrhea and Nausea And Vomiting  . Ceclor [Cefaclor] Hives and Rash    Immunization History  Administered Date(s) Administered  . Fluad Quad(high Dose 65+) 05/31/2020  . Influenza, Seasonal, Injecte, Preservative Fre 02/14/2012  . PFIZER(Purple Top)SARS-COV-2 Vaccination 07/30/2019, 08/22/2019    Past Medical History:  Diagnosis Date  . Asthma   . Broken heart syndrome 2013  . Carotid arterial disease (HCC)   . CHF (congestive heart failure) (HCC)   . COPD (chronic obstructive pulmonary disease) (HCC)   . Hypertension   . OSA (obstructive sleep apnea) 2014  . Paroxysmal SVT (supraventricular tachycardia) (HCC)    . Pulmonary fibrosis (HCC)   . Pulmonary hypertension (HCC) 10/27/2019  . PVC (premature ventricular contraction)   . 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 Ventricular tachycardia (HCC) 10/27/2019    Tobacco History: Social History   Tobacco Use  Smoking Status Former Smoker  . Packs/day: 1.50  . Years: 50.00  . Pack years: 75.00  . Types: Cigarettes  . Quit date: 05/05/1999  . Years since quitting: 21.4  Smokeless Tobacco Never Used   Counseling given: Not Answered   Outpatient Medications Prior to Visit  Medication Sig Dispense Refill  . aspirin 81 MG EC tablet Take 81 mg by mouth daily.    07/03/1999 azelastine (ASTELIN) 0.1 % nasal spray Place 2 sprays into the nose in the morning and at bedtime.    . bisoprolol (ZEBETA) 10 MG tablet Take 1 tablet (10 mg total) by mouth daily. 30 tablet 3  . Fluticasone-Umeclidin-Vilant (TRELEGY ELLIPTA) 100-62.5-25 MCG/INH AEPB Inhale 1 puff into the lungs daily. 60 each 3  . furosemide (LASIX) 20 MG tablet Take 20 mg by mouth daily.    06-02-1985 levalbuterol (XOPENEX HFA) 45 MCG/ACT inhaler Inhale into the lungs.    . magnesium oxide (MAG-OX) 400 MG tablet Take 400 mg by mouth daily.    . rosuvastatin (CRESTOR) 20 MG tablet Take 1 tablet by mouth at bedtime.    Marland Kitchen doxycycline (VIBRA-TABS) 100 MG tablet Take 1 tablet (100 mg total) by mouth 2 (two) times daily.  14 tablet 0  . Fluticasone-Umeclidin-Vilant (TRELEGY ELLIPTA) 100-62.5-25 MCG/INH AEPB Inhale 1 puff into the lungs daily. (Patient not taking: Reported on 10/03/2020) 14 each 0  . predniSONE (DELTASONE) 10 MG tablet Take 4 tabs by mouth once daily x4 days, then 3 tabs x4 days, 2 tabs x4 days, 1 tab x4 days and stop. (Patient not taking: Reported on 10/03/2020) 40 tablet 0   No facility-administered medications prior to visit.     Review of Systems:   Constitutional:   No  weight loss, night sweats,  Fevers, chills, fatigue, or  lassitude.  HEENT:   No headaches,  Difficulty swallowing,   Tooth/dental problems, or  Sore throat,                No sneezing, itching, ear ache, nasal congestion, post nasal drip,   CV:  No chest pain,  Orthopnea, PND, swelling in lower extremities, anasarca, dizziness, palpitations, syncope.   GI  No heartburn, indigestion, abdominal pain, nausea, vomiting, diarrhea, change in bowel habits, loss of appetite, bloody stools.   Resp:    No excess mucus, no productive cough,  No non-productive cough,  No coughing up of blood.  No change in color of mucus.  No wheezing.  No chest wall deformity  Skin: no rash or lesions.  GU: no dysuria, change in color of urine, no urgency or frequency.  No flank pain, no hematuria   MS:  No joint pain or swelling.  No decreased range of motion.  No back pain.    Physical Exam  BP (!) 124/50 (BP Location: Left Arm, Patient Position: Sitting, Cuff Size: Normal)   Pulse 86   Temp 98.2 F (36.8 C) (Temporal)   Ht 5\' 3"  (1.6 m)   Wt 125 lb 6.4 oz (56.9 kg)   SpO2 91%   BMI 22.21 kg/m   GEN: A/Ox3; pleasant , NAD, well nourished    HEENT:  [place]/AT,    NOSE-clear, THROAT-clear, no lesions, no postnasal drip or exudate noted.   NECK:  Supple w/ fair ROM; no JVD; normal carotid impulses w/o bruits; no thyromegaly or nodules palpated; no lymphadenopathy.    RESP  Clear  P & A; w/o, wheezes/ rales/ or rhonchi. no accessory muscle use, no dullness to percussion  CARD:  RRR, no m/r/g, no peripheral edema, pulses intact, no cyanosis or clubbing.  GI:   Soft & nt; nml bowel sounds; no organomegaly or masses detected.   Musco: Warm bil, no deformities or joint swelling noted.   Neuro: alert, no focal deficits noted.    Skin: Warm, no lesions or rashes    Lab Results:   BNP No results found for: BNP  ProBNP No results found for: PROBNP  Imaging:    No flowsheet data found.  No results found for: NITRICOXIDE      Assessment & Plan:   COPD (chronic obstructive pulmonary disease)  (HCC) Recent COPD exacerbation now resolved.  Patient is continue on triple therapy.  Check PFTs on return.  Activity as tolerated  Plan  . Patient Instructions  Continue on Trelegy 1 puff daily, rinse after use  Continue on Oxygen 2 L at bedtime Activity as tolerated.  Follow-up in 3-4  months with PFTs with Dr. and As needed         Chronic respiratory failure with hypoxia (HCC) Continue on O2 At bedtime       Tonia Brooms, NP 10/03/2020

## 2020-10-03 NOTE — Progress Notes (Signed)
PCCM:  Thanks for seeing them  Josephine Igo, DO [facility] 10/03/2020 5:34 PM

## 2020-10-03 NOTE — Addendum Note (Signed)
Addended by: Dulcy Fanny on: 10/03/2020 04:33 PM   Modules accepted: Orders

## 2020-10-03 NOTE — Assessment & Plan Note (Signed)
Continue on O2 At bedtime   °

## 2020-10-03 NOTE — Patient Instructions (Addendum)
Continue on Trelegy 1 puff daily, rinse after use  Continue on Oxygen 2 L at bedtime Activity as tolerated.  Follow-up in 3-4  months with PFTs with Dr. Tonia Brooms and As needed

## 2020-10-25 ENCOUNTER — Ambulatory Visit (HOSPITAL_COMMUNITY)
Admission: RE | Admit: 2020-10-25 | Discharge: 2020-10-25 | Disposition: A | Discharge status: Home / Self Care | Payer: Medicare Other | Source: Ambulatory Visit | Attending: Internal Medicine | Admitting: Internal Medicine

## 2020-10-25 ENCOUNTER — Other Ambulatory Visit: Payer: Self-pay

## 2020-10-25 ENCOUNTER — Ambulatory Visit (HOSPITAL_BASED_OUTPATIENT_CLINIC_OR_DEPARTMENT_OTHER)
Admission: RE | Admit: 2020-10-25 | Discharge: 2020-10-25 | Disposition: A | Discharge status: Home / Self Care | Payer: Medicare Other | Source: Ambulatory Visit | Attending: Internal Medicine | Admitting: Internal Medicine

## 2020-10-25 VITALS — BP 148/64 | HR 51 | Ht 63.0 in | Wt 124.6 lb

## 2020-10-25 DIAGNOSIS — I5022 Chronic systolic (congestive) heart failure: Secondary | ICD-10-CM

## 2020-10-25 DIAGNOSIS — Z7951 Long term (current) use of inhaled steroids: Secondary | ICD-10-CM | POA: Insufficient documentation

## 2020-10-25 DIAGNOSIS — Z79899 Other long term (current) drug therapy: Secondary | ICD-10-CM | POA: Insufficient documentation

## 2020-10-25 DIAGNOSIS — Z9981 Dependence on supplemental oxygen: Secondary | ICD-10-CM | POA: Diagnosis not present

## 2020-10-25 DIAGNOSIS — I272 Pulmonary hypertension, unspecified: Secondary | ICD-10-CM | POA: Diagnosis not present

## 2020-10-25 DIAGNOSIS — J449 Chronic obstructive pulmonary disease, unspecified: Secondary | ICD-10-CM | POA: Insufficient documentation

## 2020-10-25 DIAGNOSIS — Z8249 Family history of ischemic heart disease and other diseases of the circulatory system: Secondary | ICD-10-CM | POA: Diagnosis not present

## 2020-10-25 DIAGNOSIS — I110 Hypertensive heart disease with heart failure: Secondary | ICD-10-CM | POA: Diagnosis not present

## 2020-10-25 DIAGNOSIS — I6529 Occlusion and stenosis of unspecified carotid artery: Secondary | ICD-10-CM | POA: Diagnosis not present

## 2020-10-25 DIAGNOSIS — Z7982 Long term (current) use of aspirin: Secondary | ICD-10-CM | POA: Diagnosis not present

## 2020-10-25 DIAGNOSIS — J8410 Pulmonary fibrosis, unspecified: Secondary | ICD-10-CM | POA: Insufficient documentation

## 2020-10-25 DIAGNOSIS — Z87891 Personal history of nicotine dependence: Secondary | ICD-10-CM | POA: Diagnosis not present

## 2020-10-25 DIAGNOSIS — I493 Ventricular premature depolarization: Secondary | ICD-10-CM | POA: Insufficient documentation

## 2020-10-25 LAB — ECHOCARDIOGRAM COMPLETE
Area-P 1/2: 2.39 cm2
S' Lateral: 3.5 cm

## 2020-10-25 MED ORDER — FUROSEMIDE 20 MG PO TABS: 20.0000 mg | ORAL_TABLET | Freq: Every day | ORAL | Status: DC | PRN

## 2020-10-25 MED ORDER — SPIRONOLACTONE 25 MG PO TABS: 12.5000 mg | ORAL_TABLET | Freq: Every day | ORAL | 6 refills | Status: DC

## 2020-10-25 NOTE — Progress Notes (Signed)
ADVANCED HF CLINIC CONSULT NOTE  Referring Physician: Dr. Payton Doughty   Primary Care: Dr. Payton Doughty  Primary Cardiologist: Formerly Gunnar [facility]) - relocated to [place] 07/2020  HPI:  Joan Smith is an 80 yo woman (family friend of [facility] in cath lab) referred by Dr. 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   Her husband died in 29-Feb-2012. Shortly after that admitted with acute HF with 25 pounds of fluid.   Echo 2013 EF 35% mild to moderate TR. Cath 2013 no CAD Felt to be Tako-tsubo vs PVC-mediated  Holter 1/12 PVC burden 19.7% Holter 11/13 PVC burden 21% Holter  11/21 PVC burden 4% (no ablation or AAD)   Echo 2021 EF 55% moderate MR mild PAH G2DD  Admitted in 1/22 with sepsis/PNA.   Previously managed at [facility] in [place] - relocated to [place] 07/2020 to live with her son. Says she is doing pretty well. Purposely lost 30 pounds about 2 years ago. At last visit we switched carvedilol to bisoprolol for more B1 selectivity. Breathing better. Denies Cp, edema, orthopnea or PND. Compliant with meds.   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     azelastine (ASTELIN) 0.1 % nasal spray Place 2 sprays into the nose in the morning and at bedtime.     bisoprolol (ZEBETA) 10 MG tablet Take 1 tablet (10 mg total) by mouth daily. 30 tablet 3   Fluticasone-Umeclidin-Vilant (TRELEGY ELLIPTA) 100-62.5-25 MCG/INH AEPB Inhale 1 puff into the lungs daily. 60 each 3   furosemide (LASIX) 20 MG tablet Take 20 mg by mouth daily.     levalbuterol (XOPENEX HFA) 45 MCG/ACT inhaler Inhale into the lungs.     magnesium oxide (MAG-OX) 400 MG tablet Take 400 mg by mouth daily.     rosuvastatin (CRESTOR) 20 MG tablet Take 1 tablet by mouth at bedtime.     No current facility-administered medications for this encounter.    Allergies  Allergen Reactions   Amoxicillin Diarrhea and Nausea And Vomiting   Ceclor [Cefaclor] Hives and Rash      Social History   Socioeconomic History   Marital status: Unknown    Spouse name: Not on file   Number of children: Not on file   Years of education: Not on file   Highest education level: Not on file  Occupational History   Not on file  Tobacco Use   Smoking status: Former    Packs/day: 1.50    Years: 50.00    Pack years: 75.00  Types: Cigarettes    Quit date: 05/05/1999    Years since quitting: 21.4   Smokeless tobacco: Never  Substance and Sexual Activity   Alcohol use: Never   Drug use: Never   Sexual activity: Not on file  Other Topics Concern   Not on file  Social History Narrative   Not on file   Social Determinants of Health   Financial Resource Strain: Not on file  Food Insecurity: Not on file  Transportation Needs: Not on file  Physical Activity: Not on file  Stress: Not on file  Social Connections: Not on file  Intimate Partner Violence: Not on file      Family History  Problem Relation Age of Onset   COPD Mother    Diabetes Mellitus II Mother    CAD Mother    Heart failure Mother    CAD Father    Heart  attack Father    CAD Sister    Cardiomyopathy Brother     Vitals:   10/25/20 1134  BP: (!) 148/64  Pulse: (!) 51  SpO2: 93%  Weight: 56.5 kg (124 lb 9.6 oz)  Height: 5\' 3"  (1.6 m)     PHYSICAL EXAM: General:  Elderly. No resp difficulty HEENT: normal +xanthelasma Neck: supple. no JVD. Carotids 2+ bilat; no bruits. No lymphadenopathy or thryomegaly appreciated. Cor: PMI nondisplaced. Regular rate & rhythm. No rubs, gallops or murmurs. Lungs: clear Abdomen: soft, nontender, nondistended. No hepatosplenomegaly. No bruits or masses. Good bowel sounds. Extremities: no cyanosis, clubbing, rash, edema Neuro: alert & orientedx3, cranial nerves grossly intact. moves all 4 extremities w/o difficulty. Affect pleasant  ECG: Sinus brady 50 RBBB LVH. LAFB (bifascicular block) Personally reviewed    ASSESSMENT & PLAN:  1. Systolic HF with recovered EF - Initial event certainly sounds suspicious for TakoTsubo following the death of her husband in 2011-09-07 but subsequently ay have had a component ofPVC-related CM - Echo 2011-09-07 EF 35% mild to moderate TR.  - Cath 09-07-11 no CAD Felt to be Tako-tsubo vs PVC-mediated - Holter 1/12 PVC burden 19.7% - Holter 11/13 PVC burden 21% - Holter  11/21 PVC burden 4% - Echo 09/07/2019 EF 55% moderate MR mild PAH G2DD - Echo today 10/25/20 EF 45-50% RV ok Personally reviewed - NYHA II  - Volume status looks good on lasix 20 daily  - Will add spiro 12.5 and change lasix to prn.  - BP has not tolerated lisinopril - No SGLT2i at this point with complaints of frequent urination - Check cMRI to further evaluate cause of cardiomyopathy  2. Frequent PVCs - Holters as above. Burden much improved.  - No change  3. Carotid artery stenosis - nonobstructive - continue ECASA/statin  4. COPD with chronic hypoxic respiratory failure on home O2 -  followed by Pulmonary. No wheezing on exam   10/27/20, MD  12:36 PM

## 2020-10-25 NOTE — Patient Instructions (Signed)
STOP taking Furosemide Daily, ONLY TAKE AS NEEDED  Start Spironolactone 12.5 mg (1/2 tab) Daily  Your physician recommends that you return for lab work in: 1 week, we have provided you a prescription to have this done locally  Your physician has requested that you have a cardiac MRI. Cardiac MRI uses a computer to create images of your heart as its beating, producing both still and moving pictures of your heart and major blood vessels. For further information please visit [email or web address]. Please follow the instruction sheet given to you today for more information. ONCE APPROVED BY YOUR INSURANCE COMPANY WE WILL CALL YOU TO SCHEDULE  Your physician recommends that you schedule a follow-up appointment in: 4 months  If you have any questions or concerns before your next appointment please send [place] a message through [place] or call our office at [phone].    TO LEAVE A MESSAGE FOR THE NURSE SELECT OPTION 2, PLEASE LEAVE A MESSAGE INCLUDING: YOUR NAME DATE OF BIRTH CALL BACK NUMBER REASON FOR CALL**this is important as we prioritize the call backs  YOU WILL RECEIVE A CALL BACK THE SAME DAY AS LONG AS YOU CALL BEFORE 4:00 PM  At the [facility], you and your health needs are our priority. As part of our continuing mission to provide you with exceptional heart care, we have created designated Provider Care Teams. These Care Teams include your primary Cardiologist (physician) and Advanced Practice Providers (APPs- Physician Assistants and Nurse Practitioners) who all work together to provide you with the care you need, when you need it.   You may see any of the following providers on your designated Care Team at your next follow up: Dr Arvilla Meres Dr Marca Ancona Dr Brandon Melnick, NP Robbie Lis, [place] Mikki Santee Karle Plumber, PharmD   Please be sure to bring in all your medications bottles to every appointment.

## 2020-10-25 NOTE — Progress Notes (Signed)
  Echocardiogram 2D Echocardiogram has been performed.  Joan Smith 10/25/2020, 10:31 AM

## 2020-10-30 ENCOUNTER — Telehealth (HOSPITAL_COMMUNITY): Payer: Self-pay | Admitting: *Deleted

## 2020-10-30 NOTE — Telephone Encounter (Signed)
Precert submitted for [facility]. In clincal review.

## 2020-11-05 ENCOUNTER — Other Ambulatory Visit: Payer: Self-pay | Admitting: Internal Medicine

## 2020-11-05 ENCOUNTER — Telehealth: Payer: Self-pay | Admitting: Adult Health

## 2020-11-05 NOTE — Telephone Encounter (Signed)
I have called the pts son, Jillyn Hidden and LM on VM for him to call us back.

## 2020-11-05 NOTE — Telephone Encounter (Signed)
Primary Pulmonologist: BI Last office visit and with whom: 10/03/20 with TP What do we see them for (pulmonary problems): COPD Last OV assessment/plan: COPD (chronic obstructive pulmonary disease) (HCC) Recent COPD exacerbation now resolved.  Patient is continue on triple therapy.  Check PFTs on return.  Activity as tolerated   Plan . Patient Instructions  Continue on Trelegy 1 puff daily, rinse after use Continue on Oxygen 2 L at bedtime Activity as tolerated. Follow-up in 3-4  months with PFTs with Dr. Tonia Brooms and As needed             Chronic respiratory failure with hypoxia (HCC) Continue on O2 At bedtime    Was appointment offered to patient (explain)?  Patient's son wanted recommendations   Reason for call: Called and spoke with patient's son Joan Smith. He stated that she has been struggling with her allergies for the past 2 weeks. Her eyes have been red and itchy. Runny nose with clear discharge. Denied any fevers or any worsen SOB. She has felt fine but is tired of dealing with allergies. She will be leaving for 2 months next Thursday.   He wanted to know if TP had any recommendations on allergy medication for her. I advised him them that any of the allergy medications such as Allegra, Zyrtec, Xyzal would be ok but he wanted to know which one TP preferred.   (examples of things to ask: : When did symptoms start? Fever? Cough? Productive? Color to sputum? More sputum than usual? Wheezing? Have you needed increased oxygen? Are you taking your respiratory medications? What over the counter measures have you tried?)  Allergies  Allergen Reactions   Amoxicillin Diarrhea and Nausea And Vomiting   Ceclor [Cefaclor] Hives and Rash    Immunization History  Administered Date(s) Administered   Fluad Quad(high Dose 65+) 05/31/2020   Influenza, Seasonal, Injecte, Preservative Fre 02/14/2012   PFIZER(Purple Top)SARS-COV-2 Vaccination 07/30/2019, 08/22/2019   TP, can you please advise?  Thanks.

## 2020-11-05 NOTE — Telephone Encounter (Signed)
Would consider making sure she does not have a COVID-19.  Can do home test.  For allergies would recommend Allegra 180 mg daily as needed.  May also try Flonase over-the-counter 2 puffs daily.  As needed  Please contact office for sooner follow up if symptoms do not improve or worsen or seek emergency care

## 2020-11-06 LAB — CBC WITH DIFFERENTIAL/PLATELET
Basophils Absolute: 0 10*3/uL (ref 0.0–0.2)
Basos: 0 %
EOS (ABSOLUTE): 0.1 10*3/uL (ref 0.0–0.4)
Eos: 1 %
Hematocrit: 46.1 % (ref 34.0–46.6)
Hemoglobin: 14.2 g/dL (ref 11.1–15.9)
Immature Grans (Abs): 0 10*3/uL (ref 0.0–0.1)
Immature Granulocytes: 0 %
Lymphocytes Absolute: 2 10*3/uL (ref 0.7–3.1)
Lymphs: 24 %
MCH: 27.8 pg (ref 26.6–33.0)
MCHC: 30.8 g/dL — ABNORMAL LOW (ref 31.5–35.7)
MCV: 90 fL (ref 79–97)
Monocytes Absolute: 0.7 10*3/uL (ref 0.1–0.9)
Monocytes: 8 %
Neutrophils Absolute: 5.7 10*3/uL (ref 1.4–7.0)
Neutrophils: 67 %
Platelets: 245 10*3/uL (ref 150–450)
RBC: 5.1 x10E6/uL (ref 3.77–5.28)
RDW: 13.4 % (ref 11.7–15.4)
WBC: 8.6 10*3/uL (ref 3.4–10.8)

## 2020-11-06 LAB — BASIC METABOLIC PANEL
BUN/Creatinine Ratio: 18 (ref 12–28)
BUN: 12 mg/dL (ref 8–27)
CO2: 21 mmol/L (ref 20–29)
Calcium: 9.5 mg/dL (ref 8.7–10.3)
Chloride: 103 mmol/L (ref 96–106)
Creatinine, Ser: 0.66 mg/dL (ref 0.57–1.00)
Glucose: 99 mg/dL (ref 65–99)
Potassium: 5.2 mmol/L (ref 3.5–5.2)
Sodium: 143 mmol/L (ref 134–144)
eGFR: 88 mL/min/{1.73_m2} (ref >59)

## 2020-11-06 NOTE — Telephone Encounter (Signed)
Called and spoke to pt's son, Jillyn Hidden. Informed him of the recs per TP. Pt verbalized understanding and denied any further questions or concerns at this time.

## 2020-11-11 ENCOUNTER — Telehealth (HOSPITAL_COMMUNITY): Payer: Self-pay

## 2020-11-11 NOTE — Telephone Encounter (Signed)
-----   Message from Daniel R Bensimhon, MD sent at 11/07/2020  8:42 PM EDT ----- Potassium mildly elevated. Recheck in 2 weeks.  

## 2020-11-11 NOTE — Telephone Encounter (Signed)
-----   Message from Dolores Patty, MD sent at 11/07/2020  8:42 PM EDT ----- Potassium mildly elevated. Recheck in 2 weeks.

## 2020-11-11 NOTE — Telephone Encounter (Signed)
Patient verbalizing understanding.Lab work to be drawn at [facility] on 7/13

## 2020-11-13 ENCOUNTER — Other Ambulatory Visit: Payer: Self-pay | Admitting: Internal Medicine

## 2020-11-14 LAB — SPECIMEN STATUS REPORT

## 2020-11-15 ENCOUNTER — Encounter (HOSPITAL_COMMUNITY): Payer: Self-pay

## 2020-11-15 ENCOUNTER — Telehealth (HOSPITAL_COMMUNITY): Payer: Self-pay

## 2020-11-15 ENCOUNTER — Telehealth (HOSPITAL_COMMUNITY): Payer: Self-pay | Admitting: *Deleted

## 2020-11-15 NOTE — Telephone Encounter (Signed)
VM left for patient to call office about lab results

## 2020-11-15 NOTE — Telephone Encounter (Signed)
Spoke with pts son about lab results. No changes repeat labs in 2-3 weeks. Pt is in [place] son provided address to mail script to [address] [place] [address] for pt to have labs drawn and faxed to our office.

## 2020-11-16 LAB — BASIC METABOLIC PANEL
BUN/Creatinine Ratio: 32 — ABNORMAL HIGH (ref 12–28)
BUN: 23 mg/dL (ref 8–27)
CO2: 24 mmol/L (ref 20–29)
Calcium: 9.6 mg/dL (ref 8.7–10.3)
Chloride: 99 mmol/L (ref 96–106)
Creatinine, Ser: 0.72 mg/dL (ref 0.57–1.00)
Glucose: 124 mg/dL — ABNORMAL HIGH (ref 65–99)
Potassium: 5.2 mmol/L (ref 3.5–5.2)
Sodium: 139 mmol/L (ref 134–144)
eGFR: 84 mL/min/{1.73_m2} (ref >59)

## 2020-11-19 DIAGNOSIS — J189 Pneumonia, unspecified organism: Secondary | ICD-10-CM | POA: Insufficient documentation

## 2020-11-25 ENCOUNTER — Other Ambulatory Visit: Payer: Self-pay | Admitting: Internal Medicine

## 2020-11-25 ENCOUNTER — Telehealth: Payer: Self-pay | Admitting: Pulmonary Disease

## 2020-11-25 NOTE — Telephone Encounter (Signed)
Attempted to call pt's son Jillyn Hidden but unable to reach. Left message for him to return call.

## 2020-11-25 NOTE — Telephone Encounter (Signed)
Joan Smith son is returning phone call. Joan Smith phone number is [phone]. 

## 2020-11-25 NOTE — Telephone Encounter (Signed)
Called and spoke with pt's son Jillyn Hidden who stated that he took pt to [place] so she could be with her sister for a visit and as soon as she was taken to [place], pt ended up getting hospitalized at [facility].  Pt was hospitalized 7/19-7/21 (stay is able to be seen in pt's chart) with pneumonia.   Pt was prescribed prednisone as well as doxycycline for her to take.  Jillyn Hidden stated that pt is beginning to feel better after being in the hospital. Pt is still in [place] as she will be there with her sister until the week prior to her upcoming appt having the PFT and F/U with Dr. Tonia Brooms on 9/19.  Routing this to Dr. Tonia Brooms as an Lorain Childes as Jillyn Hidden wanted to make [place] aware of what has recently happened with pt and also if there is anything that Dr. Tonia Brooms might recommend for pt prio to upcoming appt.

## 2020-11-26 LAB — BASIC METABOLIC PANEL
BUN/Creatinine Ratio: 39 — ABNORMAL HIGH (ref 12–28)
BUN: 24 mg/dL (ref 8–27)
CO2: 29 mmol/L (ref 20–29)
Calcium: 9 mg/dL (ref 8.7–10.3)
Chloride: 99 mmol/L (ref 96–106)
Creatinine, Ser: 0.62 mg/dL (ref 0.57–1.00)
Glucose: 118 mg/dL — ABNORMAL HIGH (ref 65–99)
Potassium: 4.9 mmol/L (ref 3.5–5.2)
Sodium: 141 mmol/L (ref 134–144)
eGFR: 89 mL/min/{1.73_m2} (ref >59)

## 2020-11-26 LAB — SPECIMEN STATUS REPORT

## 2020-11-27 NOTE — Telephone Encounter (Signed)
Called and spoke with pt's son Jillyn Hidden letting him know that we can keep pt's appt as scheduled and if he is able to bring over records from the hospital stay so Dr. Tonia Brooms could review them, that would be great.  Jillyn Hidden verbalized understanding. Nothing further needed.

## 2020-11-28 ENCOUNTER — Other Ambulatory Visit (HOSPITAL_COMMUNITY): Payer: Self-pay | Admitting: Internal Medicine

## 2020-12-02 ENCOUNTER — Other Ambulatory Visit (HOSPITAL_COMMUNITY): Payer: Self-pay | Admitting: Internal Medicine

## 2021-01-03 ENCOUNTER — Ambulatory Visit: Payer: Medicare Other | Admitting: Pulmonary Disease

## 2021-01-07 ENCOUNTER — Other Ambulatory Visit (HOSPITAL_COMMUNITY): Payer: Self-pay | Admitting: *Deleted

## 2021-01-17 ENCOUNTER — Other Ambulatory Visit (HOSPITAL_COMMUNITY): Payer: Medicare Other

## 2021-01-19 ENCOUNTER — Other Ambulatory Visit: Payer: Self-pay | Admitting: *Deleted

## 2021-01-19 MED ORDER — TRELEGY ELLIPTA 100-62.5-25 MCG/INH IN AEPB: 1.0000 | INHALATION_SPRAY | Freq: Every day | RESPIRATORY_TRACT | 3 refills | Status: DC

## 2021-01-20 ENCOUNTER — Encounter: Payer: Self-pay | Admitting: Pulmonary Disease

## 2021-01-20 ENCOUNTER — Other Ambulatory Visit: Payer: Self-pay

## 2021-01-20 ENCOUNTER — Ambulatory Visit (INDEPENDENT_AMBULATORY_CARE_PROVIDER_SITE_OTHER): Payer: Medicare Other | Admitting: Pulmonary Disease

## 2021-01-20 VITALS — BP 114/70 | HR 58 | Ht 63.0 in | Wt 126.0 lb

## 2021-01-20 DIAGNOSIS — J449 Chronic obstructive pulmonary disease, unspecified: Secondary | ICD-10-CM | POA: Diagnosis not present

## 2021-01-20 DIAGNOSIS — R0600 Dyspnea, unspecified: Secondary | ICD-10-CM | POA: Diagnosis not present

## 2021-01-20 DIAGNOSIS — R0609 Other forms of dyspnea: Secondary | ICD-10-CM

## 2021-01-20 NOTE — Patient Instructions (Signed)
Thank you for visiting Dr. Tonia Brooms at [facility]. Today we recommend the following:  Trelegy 100, once daily  Continue levalbuterol as needed for SOB and Wheezing  Ok to send new refills  Go to the gym and exercise   Return in about 1 year (around 01/20/2022), or if symptoms worsen or fail to improve, for with APP or Dr. Tonia Brooms.    Please do your part to reduce the spread of COVID-19.

## 2021-01-20 NOTE — Progress Notes (Signed)
Synopsis: Referred in May 2022 for COPD by Paulina Fusi, MD  Subjective:   PATIENT ID: Joan Smith GENDER: female DOB: 03-Aug-1940, MRN: [id number]  Chief Complaint  Patient presents with   Follow-up    4 mo f/u for COPD. States she has been doing well since last visit. Has not used her O2 during the day but still using it at night with 2L.     PMH of COPD, asthma, CAD, PH, HTN. She was in the hospital for most of her childhood for asthma exacerbations on and off. Former smoker, 40+ years, 1.5 ppds. She has nocturnal hypoxemia and uses O2.  Past 2 weeks has had increased shortness of breath, cough sputum production.  She has had follow-up CT scans that were completed at in [place].  She had [facility] radiology center disc that she brought with her today.  We will have these images uploaded.  From respiratory standpoint she feels worse over the past week.  Thick sputum production.  No blood.  Denies hemoptysis  OV 01/20/2021: Doing well today.  Here for follow-up regarding COPD.  Currently managed with Trelegy and as needed albuterol.  She was recently out in [place] visiting family.  She had a short hospitalization for pneumonia.  She is recovered well since then.  Was treated with antibiotics steroids.   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   Ventricular tachycardia (HCC) 10/27/2019     Family History  Problem Relation Age of Onset   COPD Mother    Diabetes Mellitus II Mother    CAD Mother    Heart failure Mother    CAD Father    Heart  attack Father    CAD Sister    Cardiomyopathy Brother      Past Surgical History:  Procedure Laterality Date   ABDOMINAL HYSTERECTOMY      Social History   Socioeconomic History   Marital status: Unknown    Spouse name: Not on file   Number of children: Not on file   Years of education: Not on file   Highest education level: Not on file  Occupational History   Not on file  Tobacco Use   Smoking status: Former    Packs/day: 1.50    Years: 50.00    Pack years: 75.00    Types: Cigarettes    Quit date: 05/05/1999    Years since quitting: 21.7   Smokeless tobacco: Never  Substance and Sexual Activity   Alcohol use: Never   Drug use: Never   Sexual activity: Not on file  Other Topics Concern   Not on file  Social History Narrative   Not on file   Social Determinants of Health   Financial Resource Strain: Not on file  Food Insecurity: Not on file  Transportation Needs: Not on file  Physical Activity: Not on file  Stress: Not on file  Social Connections: Not on file  Intimate Partner Violence: Not on file     Allergies  Allergen Reactions   Amoxicillin Diarrhea and Nausea  And Vomiting   Ceclor [Cefaclor] Hives and Rash     Outpatient Medications Prior to Visit  Medication Sig Dispense Refill   aspirin 81 MG EC tablet Take 81 mg by mouth daily.     azelastine (ASTELIN) 0.1 % nasal spray Place 2 sprays into the nose in the morning and at bedtime.     bisoprolol (ZEBETA) 10 MG tablet Take 1 tablet (10 mg total) by mouth daily. 30 tablet 3   Fluticasone-Umeclidin-Vilant (TRELEGY ELLIPTA) 100-62.5-25 MCG/INH AEPB Inhale 1 puff into the lungs daily. 60 each 3   furosemide (LASIX) 20 MG tablet Take 1 tablet (20 mg total) by mouth daily as needed. 30 tablet    levalbuterol (XOPENEX HFA) 45 MCG/ACT inhaler Inhale into the lungs.     magnesium oxide (MAG-OX) 400 MG tablet Take 400 mg by mouth daily.     rosuvastatin (CRESTOR) 20 MG tablet Take 1 tablet by mouth at bedtime.      spironolactone (ALDACTONE) 25 MG tablet Take 0.5 tablets (12.5 mg total) by mouth daily. 15 tablet 6   No facility-administered medications prior to visit.    Review of Systems  Constitutional:  Negative for chills, fever, malaise/fatigue and weight loss.  HENT:  Negative for hearing loss, sore throat and tinnitus.   Eyes:  Negative for blurred vision and double vision.  Respiratory:  Positive for shortness of breath. Negative for cough, hemoptysis, sputum production, wheezing and stridor.   Cardiovascular:  Negative for chest pain, palpitations, orthopnea, leg swelling and PND.  Gastrointestinal:  Negative for abdominal pain, constipation, diarrhea, heartburn, nausea and vomiting.  Genitourinary:  Negative for dysuria, hematuria and urgency.  Musculoskeletal:  Negative for joint pain and myalgias.  Skin:  Negative for itching and rash.  Neurological:  Negative for dizziness, tingling, weakness and headaches.  Endo/Heme/Allergies:  Negative for environmental allergies. Does not bruise/bleed easily.  Psychiatric/Behavioral:  Negative for depression. The patient is not nervous/anxious and does not have insomnia.   All other systems reviewed and are negative.   Objective:  Physical Exam Vitals reviewed.  Constitutional:      General: She is not in acute distress.    Appearance: She is well-developed.  HENT:     Head: Normocephalic and atraumatic.     Mouth/Throat:     Pharynx: No oropharyngeal exudate.  Eyes:     Conjunctiva/sclera: Conjunctivae normal.     Pupils: Pupils are equal, round, and reactive to light.  Neck:     Vascular: No JVD.     Trachea: No tracheal deviation.     Comments: Loss of supraclavicular fat Cardiovascular:     Rate and Rhythm: Normal rate and regular rhythm.     Heart sounds: S1 normal and S2 normal.     Comments: Distant heart tones Pulmonary:     Effort: No tachypnea or accessory muscle usage.     Breath sounds: No stridor. No wheezing,  rhonchi or rales. Decreased breath sounds: throughout all lung fields.    Comments: Diminshed breaths bilaterally  Abdominal:     General: Bowel sounds are normal. There is no distension.     Palpations: Abdomen is soft.     Tenderness: There is no abdominal tenderness.  Musculoskeletal:        General: Deformity (muscle wasting ) present.  Skin:    General: Skin is warm and dry.     Capillary Refill: Capillary refill takes less than 2 seconds.     Findings: No rash.  Neurological:  Mental Status: She is alert and oriented to person, place, and time.  Psychiatric:        Behavior: Behavior normal.     Vitals:   01/20/21 1533  BP: 114/70  Pulse: (!) 58  SpO2: 97%  Weight: 126 lb (57.2 kg)  Height: 5\' 3"  (1.6 m)   97% on RA BMI Readings from Last 3 Encounters:  01/20/21 22.32 kg/m  10/25/20 22.07 kg/m  10/03/20 22.21 kg/m   Wt Readings from Last 3 Encounters:  01/20/21 126 lb (57.2 kg)  10/25/20 124 lb 9.6 oz (56.5 kg)  10/03/20 125 lb 6.4 oz (56.9 kg)     CBC    Component Value Date/Time   WBC 8.6 11/05/2020 1330   WBC 11.3 (H) 08/14/2020 1253   RBC 5.10 11/05/2020 1330   RBC 4.59 08/14/2020 1253   HGB 14.2 11/05/2020 1330   HCT 46.1 11/05/2020 1330   PLT 245 11/05/2020 1330   MCV 90 11/05/2020 1330   MCH 27.8 11/05/2020 1330   MCH 27.9 08/14/2020 1253   MCHC 30.8 (L) 11/05/2020 1330   MCHC 30.0 08/14/2020 1253   RDW 13.4 11/05/2020 1330   LYMPHSABS 2.0 11/05/2020 1330   EOSABS 0.1 11/05/2020 1330   BASOSABS 0.0 11/05/2020 1330    Chest Imaging:  08/14/2020: Hyper-expanded, lung fields, no infiltrates The patient's images have been independently reviewed by me.    Pulmonary Functions Testing Results: No flowsheet data found.  FeNO:   Pathology:   Echocardiogram:   Heart Catheterization:     Assessment & Plan:     ICD-10-CM   1. Chronic obstructive pulmonary disease, unspecified COPD type (HCC)  J44.9     2. DOE (dyspnea on  exertion)  R06.00       Discussion:  80 year old, history of COPD, history of asthma, former smoker 40+ years presented for evaluation of COPD.  Seen several months ago.  Doing better now on Trelegy.  Had a recent bout of pneumonia when she was visiting family in [place].  She is recovered from this.  Also has heart failure baseline with echocardiogram 2013 ejection fraction of 35% concern for Takotsubo versus PVC mediated cardiomyopathy.  Plan: COPD management continue Trelegy Continue lev albuterol as needed Refills given today. Follow-up with 2014 in 1 year or as needed    Current Outpatient Medications:    aspirin 81 MG EC tablet, Take 81 mg by mouth daily., Disp: , Rfl:    azelastine (ASTELIN) 0.1 % nasal spray, Place 2 sprays into the nose in the morning and at bedtime., Disp: , Rfl:    bisoprolol (ZEBETA) 10 MG tablet, Take 1 tablet (10 mg total) by mouth daily., Disp: 30 tablet, Rfl: 3   Fluticasone-Umeclidin-Vilant (TRELEGY ELLIPTA) 100-62.5-25 MCG/INH AEPB, Inhale 1 puff into the lungs daily., Disp: 60 each, Rfl: 3   furosemide (LASIX) 20 MG tablet, Take 1 tablet (20 mg total) by mouth daily as needed., Disp: 30 tablet, Rfl:    levalbuterol (XOPENEX HFA) 45 MCG/ACT inhaler, Inhale into the lungs., Disp: , Rfl:    magnesium oxide (MAG-OX) 400 MG tablet, Take 400 mg by mouth daily., Disp: , Rfl:    rosuvastatin (CRESTOR) 20 MG tablet, Take 1 tablet by mouth at bedtime., Disp: , Rfl:    spironolactone (ALDACTONE) 25 MG tablet, Take 0.5 tablets (12.5 mg total) by mouth daily., Disp: 15 tablet, Rfl: 6   06-[birth year]-1985, DO [facility] 01/20/2021 3:42 PM

## 2021-01-24 ENCOUNTER — Other Ambulatory Visit (HOSPITAL_COMMUNITY): Payer: Self-pay | Admitting: Internal Medicine

## 2021-02-06 ENCOUNTER — Other Ambulatory Visit: Payer: Self-pay

## 2021-02-06 ENCOUNTER — Ambulatory Visit (INDEPENDENT_AMBULATORY_CARE_PROVIDER_SITE_OTHER): Payer: Medicare Other | Admitting: Podiatry

## 2021-02-06 ENCOUNTER — Encounter: Payer: Self-pay | Admitting: Podiatry

## 2021-02-06 DIAGNOSIS — L89526 Pressure-induced deep tissue damage of left ankle: Secondary | ICD-10-CM

## 2021-02-06 DIAGNOSIS — L84 Corns and callosities: Secondary | ICD-10-CM | POA: Diagnosis not present

## 2021-02-06 DIAGNOSIS — M79675 Pain in left toe(s): Secondary | ICD-10-CM | POA: Diagnosis not present

## 2021-02-06 DIAGNOSIS — I739 Peripheral vascular disease, unspecified: Secondary | ICD-10-CM | POA: Diagnosis not present

## 2021-02-06 DIAGNOSIS — M79674 Pain in right toe(s): Secondary | ICD-10-CM | POA: Diagnosis not present

## 2021-02-06 DIAGNOSIS — B351 Tinea unguium: Secondary | ICD-10-CM

## 2021-02-06 NOTE — Patient Instructions (Signed)
Recommend Skechers Loafers with stretchable uppers and memory foam insoles. They can be purchased at [employer] or [email or web address].    Corns and Calluses Corns are small areas of thickened skin that form on the top, sides, or tip of a toe. Corns have a cone-shaped core with a point that can press on a nerve below. This causes pain. Calluses are areas of thickened skin that can form anywhere on the body, including the hands, fingers, palms, soles of the feet, and heels. Calluses are usually larger than corns. What are the causes? Corns and calluses are caused by rubbing (friction) or pressure, such as from shoes that are too tight or do not fit properly. What increases the risk? Corns are more likely to develop in people who have misshapen toes (toe deformities), such as hammer toes. Calluses can form with friction to any area of the skin. They are more likely to develop in people who: Work with their hands. Wear shoes that fit poorly, are too tight, or are high-heeled. Have toe deformities. What are the signs or symptoms? Symptoms of a corn or callus include: A hard growth on the skin. Pain or tenderness under the skin. Redness and swelling. Increased discomfort while wearing tight-fitting shoes, if your feet are affected. If a corn or callus becomes infected, symptoms may include: Redness and swelling that gets worse. Pain. Fluid, blood, or pus draining from the corn or callus. How is this diagnosed? Corns and calluses may be diagnosed based on your symptoms, your medical history, and a physical exam. How is this treated? Treatment for corns and calluses may include: Removing the cause of the friction or pressure. This may involve: Changing your shoes. Wearing shoe inserts (orthotics) or other protective layers in your shoes, such as a corn pad. Wearing gloves. Applying medicine to the skin (topical medicine) to help soften skin in the hardened, thickened areas. Removing layers of  dead skin with a file to reduce the size of the corn or callus. Removing the corn or callus with a scalpel or laser. Taking antibiotic medicines, if your corn or callus is infected. Having surgery, if a toe deformity is the cause. Follow these instructions at home:  Take over-the-counter and prescription medicines only as told by your health care provider. If you were prescribed an antibiotic medicine, take it as told by your health care provider. Do not stop taking it even if your condition improves. Wear shoes that fit well. Avoid wearing high-heeled shoes and shoes that are too tight or too loose. Wear any padding, protective layers, gloves, or orthotics as told by your health care provider. Soak your hands or feet. Then use a file or pumice stone to soften your corn or callus. Do this as told by your health care provider. Check your corn or callus every day for signs of infection. Contact a health care provider if: Your symptoms do not improve with treatment. You have redness or swelling that gets worse. Your corn or callus becomes painful. You have fluid, blood, or pus coming from your corn or callus. You have new symptoms. Get help right away if: You develop severe pain with redness. Summary Corns are small areas of thickened skin that form on the top, sides, or tip of a toe. These can be painful. Calluses are areas of thickened skin that can form anywhere on the body, including the hands, fingers, palms, and soles of the feet. Calluses are usually larger than corns. Corns and calluses are caused by rubbing (friction)  or pressure, such as from shoes that are too tight or do not fit properly. Treatment may include wearing padding, protective layers, gloves, or orthotics as told by your health care provider. This information is not intended to replace advice given to you by your health care provider. Make sure you discuss any questions you have with your health care provider. Document  Revised: 08/17/2019 Document Reviewed: 08/17/2019 Elsevier Patient Education  2022 Elsevier Inc.   Pressure Injury A pressure injury is damage to the skin and underlying tissue that results from pressure being applied to an area of the body. It often affects people who must spend a long time in a bed or chair because of a medical condition. Pressure injuries usually occur: Over bony parts of the body, such as the tailbone, shoulders, elbows, hips, heels, spine, ankles, and back of the head. Under medical devices that make contact with the body, such as respiratory equipment, stockings, tubes, and splints. Pressure injuries start as reddened areas on the skin and can lead to pain and an open wound. What are the causes? This condition is caused by frequent or constant pressure to an area of the body. Decreased blood flow to the skin can eventually cause the skin tissue to die and break down, causing a wound. What increases the risk? You are more likely to develop this condition if you: Are in the hospital or an extended care facility. Are bedridden or in a wheelchair. Have an injury or disease that keeps you from: Moving normally. Feeling pain or pressure. Have a condition that: Makes you sleepy or less alert. Causes poor blood flow. Need to wear a medical device. Have poor control of your bladder or bowel functions (incontinence). Have poor nutrition (malnutrition). If you are at risk for pressure injuries, your health care provider may recommend certain types of mattresses, mattress covers, pillows, cushions, or boots to help prevent them. These may include products filled with air, foam, gel, or sand. What are the signs or symptoms? Symptoms of this condition depend on the severity of the injury. Symptoms may include: Red or dark areas of the skin. Pain, warmth, or a change of skin texture. Blisters. An open wound. How is this diagnosed? This condition is diagnosed with a medical  history and physical exam. You may also have tests, such as: Blood tests. Imaging tests. Blood flow tests. Your pressure injury will be staged based on its severity. Staging is based on: The depth of the tissue injury, including whether there is exposure of muscle, bone, or tendon. The cause of the pressure injury. How is this treated? This condition may be treated by: Relieving or redistributing pressure on your skin. This includes: Frequently changing your position. Avoiding positions that caused the wound or that can make the wound worse. Using specific bed mattresses, chair cushions, or protective boots. Moving medical devices from an area of pressure, or placing padding between the skin and the device. Using foams, creams, or powders to prevent rubbing (friction) on the skin. Keeping your skin clean and dry. This may include using a skin cleanser or skin barrier as told by your health care provider. Cleaning your injury and removing any dead tissue from the wound (debridement). Placing a bandage (dressing) over your injury. Using medicines for pain or to prevent or treat infection. Surgery may be needed if other treatments are not working or if your injury is very deep. Follow these instructions at home: Wound care Follow instructions from your health care provider  about how to take care of your wound. Make sure you: Wash your hands with soap and water before and after you change your bandage (dressing). If soap and water are not available, use hand sanitizer. Change your dressing as told by your health care provider.  Check your wound every day for signs of infection. Have a caregiver do this for you if you are not able. Check for: Redness, swelling, or increased pain. More fluid or blood. Warmth. Pus or a bad smell. Skin care Keep your skin clean and dry. Gently pat your skin dry. Do not rub or massage your skin. You or a caregiver should check your skin every day for any  changes in color or any new blisters or sores (ulcers). Medicines Take over-the-counter and prescription medicines only as told by your health care provider. If you were prescribed an antibiotic medicine, take or apply it as told by your health care provider. Do not stop using the antibiotic even if your condition improves. Reducing and redistributing pressure Do not lie or sit in one position for a long time. Move or change position every 1-2 hours, or as told by your health care provider. Use pillows or cushions to reduce pressure. Ask your health care provider to recommend cushions or pads for you. General instructions  Eat a healthy diet that includes lots of protein. Drink enough fluid to keep your urine pale yellow. Be as active as you can every day. Ask your health care provider to suggest safe exercises or activities. Do not abuse drugs or alcohol. Do not use any products that contain nicotine or tobacco, such as cigarettes, e-cigarettes, and chewing tobacco. If you need help quitting, ask your health care provider. Keep all follow-up visits as told by your health care provider. This is important. Contact a health care provider if: You have: A fever or chills. Pain that is not helped by medicine. Any changes in skin color. New blisters or sores. Pus or a bad smell coming from your wound. Redness, swelling, or pain around your wound. More fluid or blood coming from your wound. Your wound does not improve after 1-2 weeks of treatment. Summary A pressure injury is damage to the skin and underlying tissue that results from pressure being applied to an area of the body. Do not lie or sit in one position for a long time. Your health care provider may advise you to move or change position every 1-2 hours. Follow instructions from your health care provider about how to take care of your wound. Keep all follow-up visits as told by your health care provider. This is important. This  information is not intended to replace advice given to you by your health care provider. Make sure you discuss any questions you have with your health care provider. Document Revised: 11/17/2017 Document Reviewed: 11/17/2017 Elsevier Patient Education  2022 [employer].

## 2021-02-12 NOTE — Progress Notes (Signed)
Subjective: Joan Smith presents today referred by Paulina Fusi, MD for complaint of callus(es) of both feet and painful thick toenails that are difficult to trim. Painful toenails interfere with ambulation. Aggravating factors include wearing enclosed shoe gear. Pain is relieved with periodic professional debridement. Painful calluses are aggravated when weightbearing with and without shoegear. Pain is relieved with periodic professional debridement.  Joan Smith has recently moved from [place], [place], to be closer to her son, Joan Smith.   Patient also relates sore on lateral aspect left ankle. She states it has been present for 9-10 months. 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     azelastine (ASTELIN) 0.1 % nasal spray Place 2 sprays into the nose in the morning and at bedtime.     bisoprolol (ZEBETA) 10 MG tablet TAKE 1 TABLET(10 MG) BY MOUTH DAILY 30 tablet 3   doxycycline (VIBRAMYCIN) 100 MG capsule Take 100 mg by mouth 2 (two) times daily.      Fluticasone-Umeclidin-Vilant (TRELEGY ELLIPTA) 100-62.5-25 MCG/INH AEPB Inhale 1 puff into the lungs daily. 60 each 3   furosemide (LASIX) 20 MG tablet Take 1 tablet (20 mg total) by mouth daily as needed. 30 tablet    levalbuterol (XOPENEX HFA) 45 MCG/ACT inhaler Inhale into the lungs.     Lisinopril 1 MG/ML SOLN      magnesium oxide (MAG-OX) 400 MG tablet Take 400 mg by mouth daily.     methylPREDNISolone (MEDROL DOSEPAK) 4 MG TBPK tablet See admin instructions. follow package directions     omeprazole (PRILOSEC) 20 MG capsule Take 20 mg by mouth daily.     predniSONE (DELTASONE) 20 MG tablet Take 20 mg by mouth 2 (two) times daily.     rosuvastatin (CRESTOR) 20 MG tablet Take 1 tablet by mouth at bedtime.     spironolactone (ALDACTONE) 25 MG tablet Take 0.5 tablets (12.5 mg total) by mouth daily. 15 tablet 6   No current facility-administered medications on file prior to visit.     Allergies  Allergen Reactions   Amoxicillin Diarrhea and Nausea And Vomiting   Ceclor [Cefaclor] Hives and Rash     Social History   Occupational History   Not on file  Tobacco Use   Smoking status: Former    Packs/day: 1.50    Years: 50.00    Pack years: 75.00    Types: Cigarettes    Quit date: 05/05/1999    Years since quitting: 21.7   Smokeless tobacco: Never  Substance  and Sexual Activity   Alcohol use: Never   Drug use: Never   Sexual activity: Not on file     Family History  Problem Relation Age of Onset   COPD Mother    Diabetes Mellitus II Mother    CAD Mother    Heart failure Mother    CAD Father    Heart attack Father    CAD Sister    Cardiomyopathy Brother      Immunization History  Administered Date(s) Administered   Fluad Quad(high Dose 65+) 05/31/2020   Influenza, Seasonal, Injecte, Preservative Fre 02/14/2012   PFIZER(Purple Top)SARS-COV-2 Vaccination 07/30/2019, 08/22/2019    Objective: Joan Smith is a pleasant 80 y.o. female WD, WN in NAD. AAO x 3.  There  were no vitals filed for this visit.  Vascular Examination:  Capillary refill time to digits <4 seconds b/l lower extremities. Faintly palpable PT pulse(s) b/l lower extremities. Nonpalpable DP pulse(s) b/l lower extremities. Lower extremity skin temperature gradient within normal limits.  Dermatological Examination: Pedal skin thin and atrophic b/l LE. No open wounds b/l LE. No interdigital macerations b/l lower extremities. Toenails 1-5 b/l elongated, discolored, dystrophic, thickened, crumbly with subungual debris and tenderness to dorsal palpation. Hyperkeratotic lesion(s) b/l lower extremities.  No erythema, no edema, no drainage, no fluctuance. Annular eschar noted on lateral malleolus. +Tenderness to palpation. No edema, no drainage.  Musculoskeletal: Normal muscle strength 5/5 to all lower extremity muscle groups bilaterally. Hallux valgus with bunion deformity noted b/l lower extremities. Hammertoe(s) noted to the 2-5 bilaterally.  Neurological: Protective sensation intact 5/5 intact bilaterally with 10g monofilament b/l. Vibratory sensation intact b/l.  Assessment: 1. Pain due to onychomycosis of toenails of both feet   2. Callus   3. Pressure injury of deep tissue of left ankle   4. PAD (peripheral artery disease) (HCC)   Plan: -Examined patient. -Discussed pressure ulcer. Recommended heel pillow on today. She is to wear it daily. -Patient to continue soft, supportive shoe gear daily. -Toenails 1-5 b/l were debrided in length and girth with sterile nail nippers and dremel without iatrogenic bleeding.  -Callus(es) submet head 3 left foot and submet head 3 right foot pared utilizing sterile scalpel blade without complication or incident. Total number debrided =2. -Patient to report any pedal injuries to medical professional immediately. -Patient/POA to call should there be question/concern in the interim.  Return in about 3 months (around 05/09/2021).  Freddie Breech, DPM

## 2021-02-24 ENCOUNTER — Ambulatory Visit (HOSPITAL_COMMUNITY)
Admission: RE | Admit: 2021-02-24 | Discharge: 2021-02-24 | Disposition: A | Discharge status: Home / Self Care | Payer: Medicare Other | Source: Ambulatory Visit | Attending: Internal Medicine | Admitting: Internal Medicine

## 2021-02-24 ENCOUNTER — Other Ambulatory Visit: Payer: Self-pay

## 2021-02-24 ENCOUNTER — Encounter (HOSPITAL_COMMUNITY): Payer: Self-pay | Admitting: Internal Medicine

## 2021-02-24 VITALS — BP 102/60 | HR 78 | Wt 122.0 lb

## 2021-02-24 DIAGNOSIS — Z79899 Other long term (current) drug therapy: Secondary | ICD-10-CM | POA: Insufficient documentation

## 2021-02-24 DIAGNOSIS — Z87891 Personal history of nicotine dependence: Secondary | ICD-10-CM | POA: Diagnosis not present

## 2021-02-24 DIAGNOSIS — I4510 Unspecified right bundle-branch block: Secondary | ICD-10-CM | POA: Diagnosis not present

## 2021-02-24 DIAGNOSIS — J8410 Pulmonary fibrosis, unspecified: Secondary | ICD-10-CM | POA: Insufficient documentation

## 2021-02-24 DIAGNOSIS — Z9981 Dependence on supplemental oxygen: Secondary | ICD-10-CM | POA: Insufficient documentation

## 2021-02-24 DIAGNOSIS — I6529 Occlusion and stenosis of unspecified carotid artery: Secondary | ICD-10-CM | POA: Diagnosis not present

## 2021-02-24 DIAGNOSIS — I493 Ventricular premature depolarization: Secondary | ICD-10-CM | POA: Diagnosis not present

## 2021-02-24 DIAGNOSIS — I5022 Chronic systolic (congestive) heart failure: Secondary | ICD-10-CM

## 2021-02-24 DIAGNOSIS — I110 Hypertensive heart disease with heart failure: Secondary | ICD-10-CM | POA: Diagnosis not present

## 2021-02-24 DIAGNOSIS — I272 Pulmonary hypertension, unspecified: Secondary | ICD-10-CM | POA: Diagnosis not present

## 2021-02-24 DIAGNOSIS — J449 Chronic obstructive pulmonary disease, unspecified: Secondary | ICD-10-CM | POA: Diagnosis not present

## 2021-02-24 DIAGNOSIS — J9611 Chronic respiratory failure with hypoxia: Secondary | ICD-10-CM | POA: Diagnosis not present

## 2021-02-24 DIAGNOSIS — Z7982 Long term (current) use of aspirin: Secondary | ICD-10-CM | POA: Insufficient documentation

## 2021-02-24 DIAGNOSIS — Z8249 Family history of ischemic heart disease and other diseases of the circulatory system: Secondary | ICD-10-CM | POA: Diagnosis not present

## 2021-02-24 NOTE — Patient Instructions (Signed)
Your physician has requested that you have a cardiac MRI. Cardiac MRI uses a computer to create images of your heart as its beating, producing both still and moving pictures of your heart and major blood vessels. For further information please visit [email or web address]. Please follow the instruction sheet given to you today for more information. ONCE APPROVED BY YOUR INSURANCE WE WILL CALL YOU TO SCHEDULE THIS  Your physician recommends that you schedule a follow-up appointment in: 6 months (April, 2023), **PLEASE CALL OUR OFFICE IN February TO SCHEDULE THIS APPOINTMENT  If you have any questions or concerns before your next appointment please send [place] a message through [place] or call our office at [phone].    TO LEAVE A MESSAGE FOR THE NURSE SELECT OPTION 2, PLEASE LEAVE A MESSAGE INCLUDING: YOUR NAME DATE OF BIRTH CALL BACK NUMBER REASON FOR CALL**this is important as we prioritize the call backs  YOU WILL RECEIVE A CALL BACK THE SAME DAY AS LONG AS YOU CALL BEFORE 4:00 PM  At the [facility], you and your health needs are our priority. As part of our continuing mission to provide you with exceptional heart care, we have created designated Provider Care Teams. These Care Teams include your primary Cardiologist (physician) and Advanced Practice Providers (APPs- Physician Assistants and Nurse Practitioners) who all work together to provide you with the care you need, when you need it.   You may see any of the following providers on your designated Care Team at your next follow up: Dr Arvilla Meres Dr Carron Curie, NP Robbie Lis, [place] [facility], [place] Karle Plumber, PharmD   Please be sure to bring in all your medications bottles to every appointment.

## 2021-02-24 NOTE — Addendum Note (Signed)
Encounter addended by: Noralee Space, RN on: 02/24/2021 12:07 PM  Actions taken: Order list changed, Diagnosis association updated, Clinical Note Signed

## 2021-02-24 NOTE — Progress Notes (Signed)
[facility] CLINIC CONSULT NOTE  Referring Physician: Dr. 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   Her husband died in March 07, 2012. Shortly after that admitted with acute HF with 25 pounds of fluid.   Echo 2013 EF 35% mild to moderate TR. Cath 2013 no CAD Felt to be Tako-tsubo vs PVC-mediated  Holter 1/12 PVC burden 19.7% Holter 11/13 PVC burden 21% Holter  11/21 PVC burden 4% (no ablation or AAD)   Echo 2021 EF 55% moderate MR mild PAH G2DD  Admitted in 1/22 with sepsis/PNA.   Previously managed at [facility] in [place] - relocated to [place] 07/2020 to live with her son. Here with her son. Says she is doing pretty well. Not exercising as much. Thinking of going to [employer]. No CP. Mild exertional dyspnea. No edema, orthopnea or PND. Gets dizzy when bending over but not when standing up. Measures weight and BP every day. Have been stable  Recent labs with PCP reviewed:  Na 141 K 4.9 Cr 0.71 HDL 46 LDL 59 TG 110   Echo today EF 45-50% RV ok Personally reviewed     Past Medical History:  Diagnosis Date   Asthma    Broken heart syndrome 2013   Carotid arterial disease (HCC)    CHF (congestive heart failure) (HCC)    COPD (chronic obstructive pulmonary disease) (HCC)    Hypertension    OSA (obstructive sleep apnea) 2014   Paroxysmal SVT (supraventricular tachycardia) (HCC)    Pulmonary fibrosis (HCC)    Pulmonary hypertension (HCC) 10/27/2019   PVC (premature ventricular contraction)    Restrictive  cardiomyopathy (HCC) 10/27/2019   Ventricular tachycardia 10/27/2019    Current Outpatient Medications  Medication Sig Dispense Refill   aspirin 81 MG EC tablet Take 81 mg by mouth daily.     azelastine (ASTELIN) 0.1 % nasal spray Place 2 sprays into the nose in the morning and at bedtime.     bisoprolol (ZEBETA) 10 MG tablet TAKE 1 TABLET(10 MG) BY MOUTH DAILY 30 tablet 3   cetirizine (ZYRTEC) 10 MG tablet Take 10 mg by mouth daily.     Fluticasone-Umeclidin-Vilant (TRELEGY ELLIPTA) 100-62.5-25 MCG/INH AEPB Inhale 1 puff into the lungs daily. 60 each 3   furosemide (LASIX) 20 MG tablet Take 1 tablet (20 mg total) by mouth daily as needed. 30 tablet    levalbuterol (XOPENEX HFA) 45 MCG/ACT inhaler Inhale into the lungs.     magnesium oxide (MAG-OX) 400 MG tablet Take 400 mg by mouth daily.     rosuvastatin (CRESTOR) 20 MG tablet Take 1 tablet by mouth at bedtime.     spironolactone (ALDACTONE) 25 MG tablet Take 0.5 tablets (12.5 mg total) by mouth daily. 15 tablet 6   No current facility-administered medications for this encounter.    Allergies  Allergen Reactions   Amoxicillin Diarrhea and Nausea And Vomiting   Ceclor [Cefaclor] Hives and Rash      Social History   Socioeconomic History   Marital status: Unknown  Spouse name: Not on file   Number of children: Not on file   Years of education: Not on file   Highest education level: Not on file  Occupational History   Not on file  Tobacco Use   Smoking status: Former    Packs/day: 1.50    Years: 50.00    Pack years: 75.00    Types: Cigarettes    Quit date: 05/05/1999    Years since quitting: 21.8   Smokeless tobacco: Never  Substance and Sexual Activity   Alcohol use: Never   Drug use: Never   Sexual activity: Not on file  Other Topics Concern   Not on file  Social History Narrative   Not on file   Social Determinants of Health   Financial Resource Strain: Not on file  Food Insecurity: Not on file   Transportation Needs: Not on file  Physical Activity: Not on file  Stress: Not on file  Social Connections: Not on file  Intimate Partner Violence: Not on file      Family History  Problem Relation Age of Onset   COPD Mother    Diabetes Mellitus II Mother    CAD Mother    Heart failure Mother    CAD Father    Heart attack Father    CAD Sister    Cardiomyopathy Brother     Vitals:   02/24/21 1103  BP: 102/60  Pulse: 78  SpO2: 95%  Weight: 55.3 kg (122 lb)     PHYSICAL EXAM: General:  Elderly. No resp difficulty HEENT: normal +xanthelasma Neck: supple. no JVD. Carotids 2+ bilat; no bruits. No lymphadenopathy or thryomegaly appreciated. Cor: PMI nondisplaced. Regular rate & rhythm. No rubs, gallops or murmurs. Lungs: clear Abdomen: soft, nontender, nondistended. No hepatosplenomegaly. No bruits or masses. Good bowel sounds. Extremities: no cyanosis, clubbing, rash, edema + multiple ecchymoses Neuro: alert & orientedx3, cranial nerves grossly intact. moves all 4 extremities w/o difficulty. Affect pleasant   ASSESSMENT & PLAN:  1. Systolic HF with mildly reduced EF - Initial event certainly sounds suspicious for TakoTsubo following the death of her husband in Sep 06, 2011 but subsequently ay have had a component of PVC-related CM - Echo Sep 06, 2011 EF 35% mild to moderate TR.  - Cath 2011/09/06 no CAD Felt to be Tako-tsubo vs PVC-mediated - Holter 1/12 PVC burden 19.7% - Holter 11/13 PVC burden 21% - Holter  11/21 PVC burden 4% - Echo Sep 06, 2019 EF 55% moderate MR mild PAH G2DD - Echo 10/25/20 EF 45-50% RV ok - Stable NYHA II - Volume status looks good on spiro - Continue spiro 12.5  - Continue bisoprolol 10  - BP has not tolerated lisinopril - No SGLT2i at this point with complaints of frequent urination. Can reconsider as needed - Check cMRI to further evaluate cause of cardiomyopathy  2. Frequent PVCs - Holters as above. Burden much improved.  - No change  3. Carotid artery  stenosis - nonobstructive - continue ECASA/statin - check lipids  4. COPD with chronic hypoxic respiratory failure on home O2 -  followed by Pulmonary (Dr. Tonia Brooms) . No wheezing on exam   5. Bifasicular block - RBBB, LAFB - stable - Repeat ECG today  Arvilla Meres, MD  11:41 AM

## 2021-04-14 ENCOUNTER — Ambulatory Visit (HOSPITAL_COMMUNITY): Payer: Medicare Other

## 2021-04-23 ENCOUNTER — Other Ambulatory Visit (HOSPITAL_COMMUNITY): Payer: Self-pay

## 2021-04-23 MED ORDER — SPIRONOLACTONE 25 MG PO TABS: 12.5000 mg | ORAL_TABLET | Freq: Every day | ORAL | 6 refills | Status: DC

## 2021-05-05 DIAGNOSIS — J449 Chronic obstructive pulmonary disease, unspecified: Secondary | ICD-10-CM | POA: Diagnosis not present

## 2021-05-20 ENCOUNTER — Telehealth (HOSPITAL_COMMUNITY): Payer: Self-pay | Admitting: Emergency Medicine

## 2021-05-20 DIAGNOSIS — I5022 Chronic systolic (congestive) heart failure: Secondary | ICD-10-CM

## 2021-05-20 NOTE — Telephone Encounter (Signed)
Reaching out to patient to offer assistance regarding upcoming cardiac imaging study; pt verbalizes understanding of appt date/time, parking situation and where to check in, and verified current allergies; name and call back number provided for further questions should they arise Rockwell Alexandria RN Navigator Cardiac Imaging [facility] [phone] office [phone] cell  Getting CBC tomorrow Denies metal implants Denies claustro Denies iv issues

## 2021-05-21 ENCOUNTER — Other Ambulatory Visit (HOSPITAL_COMMUNITY): Payer: Self-pay | Admitting: *Deleted

## 2021-05-21 DIAGNOSIS — I5022 Chronic systolic (congestive) heart failure: Secondary | ICD-10-CM | POA: Diagnosis not present

## 2021-05-22 LAB — CBC
Hematocrit: 43.2 % (ref 34.0–46.6)
Hemoglobin: 14.2 g/dL (ref 11.1–15.9)
MCH: 30.1 pg (ref 26.6–33.0)
MCHC: 32.9 g/dL (ref 31.5–35.7)
MCV: 92 fL (ref 79–97)
Platelets: 239 10*3/uL (ref 150–450)
RBC: 4.72 x10E6/uL (ref 3.77–5.28)
RDW: 12.9 % (ref 11.7–15.4)
WBC: 8 10*3/uL (ref 3.4–10.8)

## 2021-05-23 ENCOUNTER — Other Ambulatory Visit: Payer: Self-pay

## 2021-05-23 ENCOUNTER — Ambulatory Visit (HOSPITAL_COMMUNITY)
Admission: RE | Admit: 2021-05-23 | Discharge: 2021-05-23 | Disposition: A | Discharge status: Home / Self Care | Payer: PPO | Source: Ambulatory Visit | Attending: Internal Medicine | Admitting: Internal Medicine

## 2021-05-23 DIAGNOSIS — I5022 Chronic systolic (congestive) heart failure: Secondary | ICD-10-CM | POA: Insufficient documentation

## 2021-05-23 MED ORDER — GADOBUTROL 1 MMOL/ML IV SOLN
10.0000 mL | Freq: Once | INTRAVENOUS | Status: AC | PRN
  Administered 2021-05-23: 10 mL via INTRAVENOUS

## 2021-06-05 DIAGNOSIS — J449 Chronic obstructive pulmonary disease, unspecified: Secondary | ICD-10-CM | POA: Diagnosis not present

## 2021-06-06 ENCOUNTER — Encounter: Payer: Self-pay | Admitting: Pulmonary Disease

## 2021-06-09 ENCOUNTER — Other Ambulatory Visit: Payer: Self-pay

## 2021-06-09 ENCOUNTER — Ambulatory Visit (INDEPENDENT_AMBULATORY_CARE_PROVIDER_SITE_OTHER): Payer: PPO

## 2021-06-09 ENCOUNTER — Ambulatory Visit: Payer: PPO | Admitting: Nurse Practitioner

## 2021-06-09 ENCOUNTER — Encounter: Payer: Self-pay | Admitting: Nurse Practitioner

## 2021-06-09 VITALS — BP 118/74 | HR 63 | Temp 97.8°F | Ht 63.0 in | Wt 118.8 lb

## 2021-06-09 DIAGNOSIS — J310 Chronic rhinitis: Secondary | ICD-10-CM | POA: Diagnosis not present

## 2021-06-09 DIAGNOSIS — R058 Other specified cough: Secondary | ICD-10-CM | POA: Diagnosis not present

## 2021-06-09 DIAGNOSIS — J9611 Chronic respiratory failure with hypoxia: Secondary | ICD-10-CM

## 2021-06-09 DIAGNOSIS — I509 Heart failure, unspecified: Secondary | ICD-10-CM | POA: Insufficient documentation

## 2021-06-09 DIAGNOSIS — R9389 Abnormal findings on diagnostic imaging of other specified body structures: Secondary | ICD-10-CM

## 2021-06-09 DIAGNOSIS — J8410 Pulmonary fibrosis, unspecified: Secondary | ICD-10-CM | POA: Diagnosis not present

## 2021-06-09 DIAGNOSIS — J441 Chronic obstructive pulmonary disease with (acute) exacerbation: Secondary | ICD-10-CM

## 2021-06-09 DIAGNOSIS — J45901 Unspecified asthma with (acute) exacerbation: Secondary | ICD-10-CM | POA: Diagnosis not present

## 2021-06-09 DIAGNOSIS — J4489 Other specified chronic obstructive pulmonary disease: Secondary | ICD-10-CM | POA: Insufficient documentation

## 2021-06-09 DIAGNOSIS — I517 Cardiomegaly: Secondary | ICD-10-CM | POA: Diagnosis not present

## 2021-06-09 DIAGNOSIS — I5042 Chronic combined systolic (congestive) and diastolic (congestive) heart failure: Secondary | ICD-10-CM | POA: Diagnosis not present

## 2021-06-09 DIAGNOSIS — R059 Cough, unspecified: Secondary | ICD-10-CM | POA: Diagnosis not present

## 2021-06-09 DIAGNOSIS — R0602 Shortness of breath: Secondary | ICD-10-CM | POA: Diagnosis not present

## 2021-06-09 MED ORDER — PREDNISONE 10 MG PO TABS: ORAL_TABLET | ORAL | 0 refills | Status: DC

## 2021-06-09 MED ORDER — SPACER/AERO-HOLDING CHAMBERS DEVI: 2 refills | Status: DC

## 2021-06-09 MED ORDER — BREZTRI AEROSPHERE 160-9-4.8 MCG/ACT IN AERO: 2.0000 | INHALATION_SPRAY | Freq: Two times a day (BID) | RESPIRATORY_TRACT | 0 refills | Status: DC

## 2021-06-09 MED ORDER — BREZTRI AEROSPHERE 160-9-4.8 MCG/ACT IN AERO: 2.0000 | INHALATION_SPRAY | Freq: Two times a day (BID) | RESPIRATORY_TRACT | 3 refills | Status: DC

## 2021-06-09 NOTE — Assessment & Plan Note (Addendum)
Given timeframe of symptoms, suspect cough r/t upper airway irritation from significant post nasal drip. No evidence of fluid overload or uncompensated HF on exam or imaging. No recent illness or sick exposures. Advised to continue current nasal sprays. Change from Trelegy to [facility]. Chlortab at night. ENT referral.

## 2021-06-09 NOTE — Progress Notes (Signed)
@Patient  ID: Joan Smith, female    DOB: [birth year], 81 y.o.   MRN: [id number]  Chief Complaint  Patient presents with   Follow-up    She reports that she has some dry cough, and runny nose,.     Referring provider: Nicoletta Dress, MD  HPI: 81 year old female, former smoker (75 pack years) followed for COPD/asthma overlap. She is a patient of Dr. Juline Patch and last seen in office on 01/20/2021. Past medical history significant for SVT, CAD, HTN, PH, restrictive cardiomyopathy, CHF. She is followed by cardiology.   TEST/EVENTS:  10/25/2020 echocardiogram: EF 40 to 45%.  G1 DD.  Normal PASP.  LA severely dilated.  Trivial MVR.  01/20/2021: OV with Dr. Valeta Harms. Stable, doing well. Recently tx for pna while on vacation. Continued on Trelegy daily and PRN xopenex.   06/09/2021: Today-acute visit Patient presents today with son for reported cough and nasal drainage.  This has been ongoing for the last 2 months.  She reports her cough as primarily dry and states that her nose runs all the time. She has felt slightly more short of breath over the past few days. She occasionally has some watery eyes, but attributes this to her recent cataract surgery. She denies wheezing, orthopnea, PND, chest pain, hemoptysis, or lower extremity swelling. She continues on Trelegy daily and has not been using her rescue inhaler. She was recently switched from Zyrtec to [employer]. She was started on flonase nasal spray and continues on Astelin and has not noticed a huge difference.   Allergies  Allergen Reactions   Amoxicillin Diarrhea and Nausea And Vomiting   Ceclor [Cefaclor] Hives and Rash    Immunization History  Administered Date(s) Administered   Fluad Quad(high Dose 65+) 05/31/2020   Influenza, Seasonal, Injecte, Preservative Fre 02/14/2012   PFIZER(Purple Top)SARS-COV-2 Vaccination 07/30/2019, 08/22/2019    Past Medical History:  Diagnosis Date   Asthma    Broken heart syndrome 2013   Carotid  arterial disease (HCC)    CHF (congestive heart failure) (HCC)    COPD (chronic obstructive pulmonary disease) (HCC)    Hypertension    OSA (obstructive sleep apnea) 2014   Paroxysmal SVT (supraventricular tachycardia) (HCC)    Pulmonary fibrosis (HCC)    Pulmonary hypertension ([place]) 10/27/2019   PVC (premature ventricular contraction)    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     azelastine (ASTELIN) 0.1 % nasal spray Place 2 sprays into the nose in the morning and at bedtime.     bisoprolol (ZEBETA) 10 MG tablet TAKE 1 TABLET(10 MG) BY MOUTH DAILY 30 tablet 3   cetirizine (ZYRTEC) 10 MG tablet Take 10 mg by mouth daily.     fluticasone (FLONASE) 50 MCG/ACT nasal spray Place 2 sprays into both nostrils daily.     furosemide (LASIX) 20 MG tablet Take 1 tablet (20 mg total) by mouth daily as needed. 30 tablet    levalbuterol (XOPENEX HFA) 45 MCG/ACT inhaler Inhale into the lungs.     magnesium oxide (  MAG-OX) 400 MG tablet Take 400 mg by mouth daily.     rosuvastatin (CRESTOR) 20 MG tablet Take 1 tablet by mouth at bedtime.     spironolactone (ALDACTONE) 25 MG tablet Take 0.5 tablets (12.5 mg total) by mouth daily. 15 tablet 6   Fluticasone-Umeclidin-Vilant (TRELEGY ELLIPTA) 100-62.5-25 MCG/INH AEPB Inhale 1 puff into the lungs daily. 60 each 3   No facility-administered medications prior to visit.     Review of Systems:   Constitutional: No weight loss or gain, night sweats, fevers, chills, fatigue, or lassitude. HEENT: No headaches, difficulty  swallowing, tooth/dental problems, or sore throat. No sneezing, itching, ear ache. +clear rhinorrhea (constant), nasal congestion, occasional watery eyes CV:  No chest pain, orthopnea, PND, swelling in lower extremities, anasarca, dizziness, palpitations, syncope Resp: +shortness of breath with exertion and coughing spells; dry cough. No excess mucus or change in color of mucus.  No hemoptysis. No wheezing.  No chest wall deformity GI:  No heartburn, indigestion, abdominal pain, nausea, vomiting, diarrhea, change in bowel habits, loss of appetite, bloody stools.  GU: No dysuria, change in color of urine, urgency or frequency.  No flank pain, no hematuria  Skin: No rash, lesions, ulcerations MSK:  No joint pain or swelling.  No decreased range of motion.  No back pain. Neuro: No dizziness or lightheadedness.  Psych: No depression or anxiety. Mood stable.     Physical Exam:  BP 118/74 (BP Location: Left Arm, Patient Position: Sitting, Cuff Size: Normal)    Pulse 63    Temp 97.8 F (36.6 C) (Oral)    Ht 5\' 3"  (1.6 m)    Wt 118 lb 12.8 oz (53.9 kg)    SpO2 97%    BMI 21.04 kg/m   GEN: Pleasant, interactive, well-nourished; in no acute distress HEENT:  Normocephalic and atraumatic. EACs patent bilaterally. TM pearly gray with present light reflex bilaterally. PERRLA. Sclera white. Nasal turbinates pink, moist and patent bilaterally. Clear rhinorrhea present. Oropharynx erythematous and moist, without exudate or edema. No lesions, ulcerations, or postnasal drip.  NECK:  Supple w/ fair ROM. No JVD present. Normal carotid impulses w/o bruits. Thyroid symmetrical with no goiter or nodules palpated. No lymphadenopathy.   CV: RRR, no m/r/g, no peripheral edema. Pulses intact, +2 bilaterally. No cyanosis, pallor or clubbing. PULMONARY:  Unlabored, regular breathing. Clear bilaterally A&P w/o wheezes/rales/rhonchi. No accessory muscle use. No dullness to percussion. GI: BS present and normoactive. Soft,  non-tender to palpation. No organomegaly or masses detected. No CVA tenderness. MSK: No erythema, warmth or tenderness. Cap refil <2 sec all extrem. No deformities or joint swelling noted.  Neuro: A/Ox3. No focal deficits noted.   Skin: Warm, no lesions or rashe Psych: Normal affect and behavior. Judgement and thought content appropriate.     Lab Results:  CBC    Component Value Date/Time   WBC 8.0 05/21/2021 1015   WBC 11.3 (H) 08/14/2020 1253   RBC 4.72 05/21/2021 1015   RBC 4.59 08/14/2020 1253   HGB 14.2 05/21/2021 1015   HCT 43.2 05/21/2021 1015   PLT 239 05/21/2021 1015   MCV 92 05/21/2021 1015   MCH 30.1 05/21/2021 1015   MCH 27.9 08/14/2020 1253   MCHC 32.9 05/21/2021 1015   MCHC 30.0 08/14/2020 1253   RDW 12.9 05/21/2021 1015   LYMPHSABS 2.0 11/05/2020 1330   EOSABS 0.1 11/05/2020 1330   BASOSABS 0.0 11/05/2020 1330    BMET    Component Value Date/Time   NA 141 11/25/2020  1421   K 4.9 11/25/2020 1421   CL 99 11/25/2020 1421   CO2 29 11/25/2020 1421   GLUCOSE 118 (H) 11/25/2020 1421   GLUCOSE 107 (H) 08/14/2020 1253   BUN 24 11/25/2020 1421   CREATININE 0.62 11/25/2020 1421   CALCIUM 9.0 11/25/2020 1421   GFRNONAA >60 08/14/2020 1253    BNP No results found for: BNP   Imaging:  DG Chest 2 View  Result Date: 06/09/2021 CLINICAL DATA:  Cough, shortness of breath EXAM: CHEST - 2 VIEW COMPARISON:  08/14/2020 FINDINGS: Stable cardiomegaly. Aortic atherosclerosis. Hyperexpanded lung fields with coarsened interstitial markings bilaterally, similar to prior. Area of increased density at the right lung apex likely related to prominence of the right first rib costochondral junction although underlying parenchymal opacity at this site is not entirely excluded. Unchanged areas of linear scarring in the lung bases. No pleural effusion or pneumothorax. IMPRESSION: 1. Area of increased density at the right lung apex likely related to prominence of the right first rib  costochondral junction although underlying parenchymal opacity at this site is not entirely excluded. Consider follow-up apical lordotic chest radiograph for further evaluation. 2. Cardiomegaly and findings of pulmonary fibrosis, similar in appearance to prior. Electronically Signed   By: Davina Poke D.O.   On: 06/09/2021 13:16   MR CARDIAC MORPHOLOGY W WO CONTRAST  Result Date: 05/27/2021 CLINICAL DATA:  Nonischemic cardiomyopathy EXAM: CARDIAC MRI TECHNIQUE: The patient was scanned on a 1.5 Tesla GE magnet. A dedicated cardiac coil was used. Functional imaging was done using Fiesta sequences. 2,3, and 4 chamber views were done to assess for RWMA's. Modified Simpson's rule using a short axis stack was used to calculate an ejection fraction on a dedicated work Conservation officer, nature. The patient received 8 cc of Gadavist. After 10 minutes inversion recovery sequences were used to assess for infiltration and scar tissue. FINDINGS: Limited images of the lung fields show coarse lung markings, patient has history of COPD. Very difficult study. Cine images do not appear to trigger correctly and are not interpretable. Unable to calculate LV and RV EF. Delayed enhancement images are difficult but there appears to be mid wall late gadolinium enhancement (LGE) with a basal septal stripe as well as LGE at the basal inferoseptal and basal anteroseptal RV insertion sites. T1 parametrics also do not appear usable. IMPRESSION: 1. Technically very difficult study. Cine images are not interpretable. I am attempting to contact the tech to see if there is a patient factor causing this that will make an adequate MRI difficult to obtain or whether we should repeat the study. 2. Noncoronary LGE pattern in the basal septum. The RV insertion sites with LGE can be nonspecific and suggest pressure/volume overload. However, the more extensive mid-wall basal septal LGE may be indicative of prior myocarditis. Dalton Mclean  Electronically Signed   By: Loralie Champagne M.D.   On: 05/27/2021 17:17      No flowsheet data found.  No results found for: NITRICOXIDE      Assessment & Plan:   Asthma with COPD with exacerbation ([place]) Mild flare in SOB over the past few days. Has previously been stable on current regimen. Suspect cough is more so related to upper airway irritation r/t post nasal drip given significant nasal drainage; however, will tx for acute flare given increased SOB with prednisone taper. Change from Trelegy to [facility] with spacer as DPI could be exacerbating cough. Continue PRN xopenex.   Patient Instructions  -Stop Trelegy inhaler.  Start Breztri 2 puffs Twice daily, use with spacer. Brush tongue and rinse mouth afterwards  -Continue astelin nasal spray 2 sprays each nostril Twice daily  -Continue flonase nasal spray 2 sprays each nostril daily -Continue Allegra daily  -Continue xopenex 1-2 puffs every 6 hours as needed for shortness of breath or wheezing -Continue supplemental oxygen 2 lpm at night. Walking oximetry today with no decrease in your oxygen on room air.   -Prednisone taper. 4 tabs for 2 days, then 3 tabs for 2 days, 2 tabs for 2 days, then 1 tab for 2 days, then stop. Take in AM with food -Saline nasal irrigation 1-2 times per day -Chlortab 4mg  over the counter at night as needed for cough  Chest x ray today. We will notify you of any abnormal results.  Referral to ENT  Follow up in one month with Dr. Valeta Harms or Joellen Jersey Audra Kagel,NP. If symptoms do not improve or worsen, please contact office for sooner follow up or seek emergency care.   Chronic rhinitis Persistent symptoms >2 months despite multiple nasal sprays and antihistamine. Advised to add saline nasal irrigations. Continue current regimen. Refer to ENT.   Upper airway cough syndrome Given timeframe of symptoms, suspect cough r/t upper airway irritation from significant post nasal drip. No evidence of fluid overload or  uncompensated HF on exam or imaging. No recent illness or sick exposures. Advised to continue current nasal sprays. Change from Trelegy to [facility]. Chlortab at night. ENT referral.   Chronic respiratory failure with hypoxia (HCC) Stable. Walking oximetry without desaturation (94% on room air). Continue supplemental nocturnal oxygen at 2 lpm. Monitor O2 at home for goal SpO2 >88-90%  CHF (congestive heart failure) ([place]) Appears compensated upon exam. CXR without any evidence of pulm edema/vascular congestion.   Clayton Bibles, NP 06/09/2021  Pt aware and understands NP's role.

## 2021-06-09 NOTE — Assessment & Plan Note (Signed)
Appears compensated upon exam. CXR without any evidence of pulm edema/vascular congestion.

## 2021-06-09 NOTE — Assessment & Plan Note (Signed)
Mild flare in SOB over the past few days. Has previously been stable on current regimen. Suspect cough is more so related to upper airway irritation r/t post nasal drip given significant nasal drainage; however, will tx for acute flare given increased SOB with prednisone taper. Change from Trelegy to [facility] with spacer as DPI could be exacerbating cough. Continue PRN xopenex.   Patient Instructions  -Stop Trelegy inhaler. Start Breztri 2 puffs Twice daily, use with spacer. Brush tongue and rinse mouth afterwards  -Continue astelin nasal spray 2 sprays each nostril Twice daily  -Continue flonase nasal spray 2 sprays each nostril daily -Continue Allegra daily  -Continue xopenex 1-2 puffs every 6 hours as needed for shortness of breath or wheezing -Continue supplemental oxygen 2 lpm at night. Walking oximetry today with no decrease in your oxygen on room air.   -Prednisone taper. 4 tabs for 2 days, then 3 tabs for 2 days, 2 tabs for 2 days, then 1 tab for 2 days, then stop. Take in AM with food -Saline nasal irrigation 1-2 times per day -Chlortab 4mg  over the counter at night as needed for cough  Chest x ray today. We will notify you of any abnormal results.  Referral to ENT  Follow up in one month with Dr. or Tonia Brooms Nedim Oki,NP. If symptoms do not improve or worsen, please contact office for sooner follow up or seek emergency care.

## 2021-06-09 NOTE — Assessment & Plan Note (Signed)
Persistent symptoms >2 months despite multiple nasal sprays and antihistamine. Advised to add saline nasal irrigations. Continue current regimen. Refer to ENT.

## 2021-06-09 NOTE — Patient Instructions (Addendum)
-  Stop Trelegy inhaler. Start Breztri 2 puffs Twice daily, use with spacer. Brush tongue and rinse mouth afterwards  -Continue astelin nasal spray 2 sprays each nostril Twice daily  -Continue flonase nasal spray 2 sprays each nostril daily -Continue Allegra daily  -Continue xopenex 1-2 puffs every 6 hours as needed for shortness of breath or wheezing -Continue supplemental oxygen 2 lpm at night. Walking oximetry today with no decrease in your oxygen on room air.   -Prednisone taper. 4 tabs for 2 days, then 3 tabs for 2 days, 2 tabs for 2 days, then 1 tab for 2 days, then stop. Take in AM with food -Saline nasal irrigation 1-2 times per day -Chlortab 4mg  over the counter at night as needed for cough  Chest x ray today. We will notify you of any abnormal results.  Referral to ENT  Follow up in one month with Joan Smith or Joan Jersey Coda Filler,NP. If symptoms do not improve or worsen, please contact office for sooner follow up or seek emergency care.

## 2021-06-09 NOTE — Assessment & Plan Note (Addendum)
Stable. Walking oximetry without desaturation (94% on room air). Continue supplemental nocturnal oxygen at 2 lpm. Monitor O2 at home for goal SpO2 >88-90%

## 2021-06-11 NOTE — Progress Notes (Signed)
CXR with area of increased density at the right lung apex likely r/t prominence of the right first rib costochondral junction although underlying parenchymal opacity at this site, not entirely excluded. Further imaging indicated - CT chest with contrast ordered. Atherosclerosis, cardiomegaly and coarsened interstitial markings b/l, similar in appearance. Discussed findings with patient who verbalized understanding.

## 2021-06-12 ENCOUNTER — Encounter: Payer: Self-pay | Admitting: Podiatry

## 2021-06-12 ENCOUNTER — Ambulatory Visit: Payer: PPO | Admitting: Podiatry

## 2021-06-12 DIAGNOSIS — L84 Corns and callosities: Secondary | ICD-10-CM | POA: Diagnosis not present

## 2021-06-12 DIAGNOSIS — M79674 Pain in right toe(s): Secondary | ICD-10-CM

## 2021-06-12 DIAGNOSIS — B351 Tinea unguium: Secondary | ICD-10-CM | POA: Diagnosis not present

## 2021-06-12 DIAGNOSIS — M79675 Pain in left toe(s): Secondary | ICD-10-CM

## 2021-06-12 DIAGNOSIS — Q828 Other specified congenital malformations of skin: Secondary | ICD-10-CM | POA: Insufficient documentation

## 2021-06-12 DIAGNOSIS — B070 Plantar wart: Secondary | ICD-10-CM | POA: Insufficient documentation

## 2021-06-12 DIAGNOSIS — I739 Peripheral vascular disease, unspecified: Secondary | ICD-10-CM | POA: Diagnosis not present

## 2021-06-12 NOTE — Patient Instructions (Signed)
Purchase silicone toe caps from [employer], size mini or size small.  Purchase Oofos Slippers from [email or web address]

## 2021-06-18 ENCOUNTER — Other Ambulatory Visit (HOSPITAL_COMMUNITY): Payer: Self-pay

## 2021-06-18 DIAGNOSIS — H2703 Aphakia, bilateral: Secondary | ICD-10-CM | POA: Diagnosis not present

## 2021-06-18 MED ORDER — BISOPROLOL FUMARATE 10 MG PO TABS: ORAL_TABLET | ORAL | 3 refills | Status: DC

## 2021-06-18 NOTE — Progress Notes (Signed)
°  °  Subjective:  Patient ID: Joan Smith, female    DOB: June 19, 1940,  MRN: [id number]  [name] presents to clinic today for for at risk foot care. Patient has h/o PAD and callus(es) bilaterally and painful thick toenails that are difficult to trim. Painful toenails interfere with ambulation. Aggravating factors include wearing enclosed shoe gear. Pain is relieved with periodic professional debridement. Painful calluses are aggravated when weightbearing with and without shoegear. Pain is relieved with periodic professional debridement.  New problem(s): None.   She is accompanied by her son on today's visit.  PCP is Paulina Fusi, MD , and last visit was one month ago.  Allergies  Allergen Reactions   Amoxicillin Diarrhea and Nausea And Vomiting   Ceclor [Cefaclor] Hives and Rash    Review of Systems: Negative except as noted in the HPI. Objective:   Constitutional [name] is a pleasant 81 y.o. Caucasian female, in NAD. AAO x 3.   Vascular CFT <4 seconds b/l LE. Faintly palpable PT pulse(s) b/l LE. Diminished DP pulse(s) b/l LE. No pain with calf compression b/l. Lower extremity skin temperature gradient within normal limits. No edema noted b/l LE. No ischemia or gangrene noted b/l LE. No cyanosis or clubbing noted b/l LE.  Neurologic Normal speech. Oriented to person, place, and time. Protective sensation intact 5/5 intact bilaterally with 10g monofilament b/l. Vibratory sensation intact b/l.  Dermatologic Pedal integument with normal turgor, texture and tone BLE. No open wounds b/l LE. No interdigital macerations noted b/l LE. Toenails 1-5 bilaterally elongated, discolored, dystrophic, thickened, and crumbly with subungual debris and tenderness to dorsal palpation. Hyperkeratotic lesion(s) R 4th toe and submet head 3 b/l.  No erythema, no edema, no drainage, no fluctuance.  Orthopedic: Muscle strength 5/5 to all lower extremity muscle groups bilaterally. HAV with  bunion deformity noted b/l LE. Hammertoe deformity noted 2-5 b/l.   Radiographs: None  Last A1c: No flowsheet data found.   Assessment:   1. Pain due to onychomycosis of toenails of both feet   2. Corns and callosities   3. PAD (peripheral artery disease) (HCC)    Plan:  Patient was evaluated and treated and all questions answered. Consent given for treatment as described below: -Mycotic toenails 1-5 bilaterally were debrided in length and girth with sterile nail nippers and dremel without incident. -Corn(s) R 4th toe and callus(es) submet head 3 b/l were pared utilizing sterile scalpel blade without incident. Total number debrided =3. -Recommended Oofos Slippers.. -Dispensed digital toe cap for right 4th digit. Apply to R 4th toe every morning. Remove every evening. -Patient/POA to call should there be question/concern in the interim.  Return in about 9 weeks (around 08/14/2021).  Freddie Breech, DPM

## 2021-07-03 DIAGNOSIS — J449 Chronic obstructive pulmonary disease, unspecified: Secondary | ICD-10-CM | POA: Diagnosis not present

## 2021-08-03 DIAGNOSIS — J449 Chronic obstructive pulmonary disease, unspecified: Secondary | ICD-10-CM | POA: Diagnosis not present

## 2021-08-11 DIAGNOSIS — E785 Hyperlipidemia, unspecified: Secondary | ICD-10-CM | POA: Diagnosis not present

## 2021-08-11 DIAGNOSIS — J449 Chronic obstructive pulmonary disease, unspecified: Secondary | ICD-10-CM | POA: Diagnosis not present

## 2021-08-11 DIAGNOSIS — I5022 Chronic systolic (congestive) heart failure: Secondary | ICD-10-CM | POA: Diagnosis not present

## 2021-08-11 DIAGNOSIS — I10 Essential (primary) hypertension: Secondary | ICD-10-CM | POA: Diagnosis not present

## 2021-08-11 DIAGNOSIS — M8589 Other specified disorders of bone density and structure, multiple sites: Secondary | ICD-10-CM | POA: Diagnosis not present

## 2021-08-15 ENCOUNTER — Ambulatory Visit: Payer: PPO | Admitting: Podiatry

## 2021-08-22 ENCOUNTER — Ambulatory Visit: Payer: PPO | Admitting: Podiatry

## 2021-09-02 DIAGNOSIS — J449 Chronic obstructive pulmonary disease, unspecified: Secondary | ICD-10-CM | POA: Diagnosis not present

## 2021-09-04 ENCOUNTER — Ambulatory Visit: Payer: PPO | Admitting: Podiatry

## 2021-09-04 ENCOUNTER — Encounter: Payer: Self-pay | Admitting: Podiatry

## 2021-09-04 DIAGNOSIS — I739 Peripheral vascular disease, unspecified: Secondary | ICD-10-CM

## 2021-09-04 DIAGNOSIS — M79674 Pain in right toe(s): Secondary | ICD-10-CM | POA: Diagnosis not present

## 2021-09-04 DIAGNOSIS — L84 Corns and callosities: Secondary | ICD-10-CM

## 2021-09-04 DIAGNOSIS — B351 Tinea unguium: Secondary | ICD-10-CM | POA: Diagnosis not present

## 2021-09-04 DIAGNOSIS — S99921A Unspecified injury of right foot, initial encounter: Secondary | ICD-10-CM

## 2021-09-04 DIAGNOSIS — M79675 Pain in left toe(s): Secondary | ICD-10-CM

## 2021-09-14 NOTE — Progress Notes (Signed)
?  Subjective:  ?Patient ID: Joan Smith, female    DOB: 01/06/1941,  MRN: [id number] ? ?Joan Smith presents to clinic today for for at risk foot care. Patient has h/o PAD and callus(es) b/l lower extremities and painful thick toenails that are difficult to trim. Painful toenails interfere with ambulation. Aggravating factors include wearing enclosed shoe gear. Pain is relieved with periodic professional debridement. Painful calluses are aggravated when weightbearing with and without shoegear. Pain is relieved with periodic professional debridement. ? ?New problem(s): with chief concern of injury to R 2nd toe. Injury occurred yesterday. Injury recurred as a result of a direct trauma, dropping a can of pumpkin on her toe yesterday. Patient states aggravating factor(s) is/are direct pressure and shoes pressing against the toe.  Patient has tried no attempts at treatment. ? ?Her son is present during today's visit. ? ?PCP is Joan Dress, MD , and last visit was one month ago. ? ?Allergies  ?Allergen Reactions  ? Amoxicillin Diarrhea and Nausea And Vomiting  ? Ceclor [Cefaclor] Hives and Rash  ? ? ?Review of Systems: Negative except as noted in the HPI. ? ?Objective:  ?Constitutional Joan Smith is a pleasant 81 y.o. Caucasian female, in NAD. AAO x 3.   ?Vascular CFT <4 seconds b/l LE. Faintly palpable PT pulse(s) b/l LE. Diminished DP pulse(s) b/l LE. No pain with calf compression b/l. Lower extremity skin temperature gradient within normal limits. No edema noted b/l LE. No ischemia or gangrene noted b/l LE. No cyanosis or clubbing noted b/l LE.  ?Neurologic Normal speech. Oriented to person, place, and time. Protective sensation intact 5/5 intact bilaterally with 10g monofilament b/l. Vibratory sensation intact b/l.  ?Dermatologic Pedal integument with normal turgor, texture and tone BLE. Edema noted right 2nd toe with no ecchymosis. +Tenderness to palpation. No open wounds b/l LE. No interdigital  macerations noted b/l LE. Toenails 1-5 bilaterally elongated, discolored, dystrophic, thickened, and crumbly with subungual debris and tenderness to dorsal palpation. Hyperkeratotic lesion(s) R 4th toe and submet head 3 b/l.  No erythema, no edema, no drainage, no fluctuance.  ?Orthopedic: Muscle strength 5/5 to all lower extremity muscle groups bilaterally. HAV with bunion deformity noted b/l LE. Hammertoe deformity noted 2-5 b/l.  ? ?Radiographs: None ? ?Assessment/Plan: ?1. Pain due to onychomycosis of toenails of both feet   ?2. Corns and callosities   ?3. Injury of second toe of right foot   ?4. PAD (peripheral artery disease) ([place])   ?-Patient was evaluated and treated. All patient's and/or POA's questions/concerns answered on today's visit. ?-We discussed injury to right 2nd toe. Patient declines xrays on today's visit. Instructed her on buddy splinting toe to adjacent toe and educated her on checking toe circulation as it pertains to dressing that is too tight. She will call office if condition worsens or does not improve. ?-Toenails 1-5 b/l were debrided in length and girth with sterile nail nippers and dremel without iatrogenic bleeding.  ?-Corn(s) R 4th toe and callus(es) submet head 3 b/l were pared utilizing sterile scalpel blade without incident. Total number debrided =3. ?-Patient/POA to call should there be question/concern in the interim.  ? ?Return in about 3 months (around 12/05/2021). ? ?Marzetta Board, DPM  ?

## 2021-09-26 ENCOUNTER — Telehealth: Payer: Self-pay | Admitting: Pulmonary Disease

## 2021-09-26 DIAGNOSIS — J441 Chronic obstructive pulmonary disease with (acute) exacerbation: Secondary | ICD-10-CM

## 2021-09-26 MED ORDER — BREZTRI AEROSPHERE 160-9-4.8 MCG/ACT IN AERO: 2.0000 | INHALATION_SPRAY | Freq: Two times a day (BID) | RESPIRATORY_TRACT | 3 refills | Status: DC

## 2021-09-26 NOTE — Telephone Encounter (Signed)
I called the patient and let her know that her Joan Smith was refilled. She did not have any questions. Nothing further needed.

## 2021-10-03 ENCOUNTER — Other Ambulatory Visit (HOSPITAL_COMMUNITY): Payer: Self-pay | Admitting: Internal Medicine

## 2021-10-03 DIAGNOSIS — J449 Chronic obstructive pulmonary disease, unspecified: Secondary | ICD-10-CM | POA: Diagnosis not present

## 2021-10-29 ENCOUNTER — Encounter: Payer: Self-pay | Admitting: Pulmonary Disease

## 2021-10-29 ENCOUNTER — Telehealth: Payer: Self-pay | Admitting: Pulmonary Disease

## 2021-11-02 DIAGNOSIS — J449 Chronic obstructive pulmonary disease, unspecified: Secondary | ICD-10-CM | POA: Diagnosis not present

## 2021-11-03 NOTE — Telephone Encounter (Signed)
Seems like encounter was open in error so closing encounter.  

## 2021-11-10 DIAGNOSIS — I10 Essential (primary) hypertension: Secondary | ICD-10-CM | POA: Diagnosis not present

## 2021-11-10 DIAGNOSIS — J449 Chronic obstructive pulmonary disease, unspecified: Secondary | ICD-10-CM | POA: Diagnosis not present

## 2021-11-10 DIAGNOSIS — R636 Underweight: Secondary | ICD-10-CM | POA: Diagnosis not present

## 2021-11-10 DIAGNOSIS — E785 Hyperlipidemia, unspecified: Secondary | ICD-10-CM | POA: Diagnosis not present

## 2021-11-10 DIAGNOSIS — M8589 Other specified disorders of bone density and structure, multiple sites: Secondary | ICD-10-CM | POA: Diagnosis not present

## 2021-11-10 DIAGNOSIS — I5022 Chronic systolic (congestive) heart failure: Secondary | ICD-10-CM | POA: Diagnosis not present

## 2021-11-10 DIAGNOSIS — L989 Disorder of the skin and subcutaneous tissue, unspecified: Secondary | ICD-10-CM | POA: Diagnosis not present

## 2021-11-12 ENCOUNTER — Other Ambulatory Visit (HOSPITAL_COMMUNITY): Payer: Self-pay | Admitting: *Deleted

## 2021-11-12 MED ORDER — SPIRONOLACTONE 25 MG PO TABS: 12.5000 mg | ORAL_TABLET | Freq: Every day | ORAL | 6 refills | Status: DC

## 2021-12-01 ENCOUNTER — Ambulatory Visit: Payer: PPO | Admitting: Pulmonary Disease

## 2021-12-03 DIAGNOSIS — J449 Chronic obstructive pulmonary disease, unspecified: Secondary | ICD-10-CM | POA: Diagnosis not present

## 2021-12-04 ENCOUNTER — Encounter: Payer: Self-pay | Admitting: Podiatry

## 2021-12-04 ENCOUNTER — Encounter (HOSPITAL_COMMUNITY): Payer: PPO

## 2021-12-04 ENCOUNTER — Ambulatory Visit: Payer: PPO | Admitting: Podiatry

## 2021-12-04 DIAGNOSIS — T148XXA Other injury of unspecified body region, initial encounter: Secondary | ICD-10-CM | POA: Diagnosis not present

## 2021-12-04 DIAGNOSIS — Q828 Other specified congenital malformations of skin: Secondary | ICD-10-CM

## 2021-12-04 DIAGNOSIS — B351 Tinea unguium: Secondary | ICD-10-CM

## 2021-12-04 DIAGNOSIS — M79674 Pain in right toe(s): Secondary | ICD-10-CM

## 2021-12-04 DIAGNOSIS — M79675 Pain in left toe(s): Secondary | ICD-10-CM | POA: Diagnosis not present

## 2021-12-04 DIAGNOSIS — I739 Peripheral vascular disease, unspecified: Secondary | ICD-10-CM

## 2021-12-04 NOTE — Patient Instructions (Addendum)
Apply Betadine Solution to top of right 3rd and 4th toes once daily and cover with fabric dot band-aids.   Do not use over the counter chemical corn/callus removers.  Shoe recommendations:  Durene Cal with mesh uppers. They can be purchased online at ShapeHeads.it.    Drew Breezy  Oofos Slippers on [employer].[email or web address]. Oofos can also be purchased at [facility].   Orthofeet Retail banker can be purchased on [employer] or on [employer].[email or web address]   Coral No-tie lace shoe LaserRates.fr [place] Diabetic Slippers (available for women and men)   *Psychologist, educational with mesh (soft, stretchable) uppers. Avoid leather sneakers.*  Recommend Skechers Loafers with stretchable uppers and memory foam insoles. They can be purchased at [employer], [employer] or [employer]. Also on DealerOdds.hu.    2. New Balance Sneakers 600 Series or Higher ([email or web address]). *Purchase sneakers with mesh (soft, stretchable) uppers. Avoid leather sneakers.*  3.  Shon Baton Beast: *Purchase sneakers with mesh (soft, stretchable) uppers. Avoid leather sneakers.*

## 2021-12-05 ENCOUNTER — Encounter (HOSPITAL_COMMUNITY): Payer: PPO

## 2021-12-05 ENCOUNTER — Encounter (HOSPITAL_COMMUNITY): Payer: Self-pay

## 2021-12-05 ENCOUNTER — Ambulatory Visit (HOSPITAL_COMMUNITY)
Admission: RE | Admit: 2021-12-05 | Discharge: 2021-12-05 | Disposition: A | Discharge status: Home / Self Care | Payer: PPO | Source: Ambulatory Visit | Attending: Family Medicine | Admitting: Family Medicine

## 2021-12-05 VITALS — BP 118/56 | HR 58 | Wt 120.6 lb

## 2021-12-05 DIAGNOSIS — J8410 Pulmonary fibrosis, unspecified: Secondary | ICD-10-CM | POA: Diagnosis not present

## 2021-12-05 DIAGNOSIS — I493 Ventricular premature depolarization: Secondary | ICD-10-CM

## 2021-12-05 DIAGNOSIS — Z87891 Personal history of nicotine dependence: Secondary | ICD-10-CM | POA: Diagnosis not present

## 2021-12-05 DIAGNOSIS — I5022 Chronic systolic (congestive) heart failure: Secondary | ICD-10-CM | POA: Diagnosis not present

## 2021-12-05 DIAGNOSIS — I110 Hypertensive heart disease with heart failure: Secondary | ICD-10-CM | POA: Diagnosis not present

## 2021-12-05 DIAGNOSIS — I4510 Unspecified right bundle-branch block: Secondary | ICD-10-CM | POA: Insufficient documentation

## 2021-12-05 DIAGNOSIS — Z9981 Dependence on supplemental oxygen: Secondary | ICD-10-CM | POA: Diagnosis not present

## 2021-12-05 DIAGNOSIS — R002 Palpitations: Secondary | ICD-10-CM | POA: Diagnosis not present

## 2021-12-05 DIAGNOSIS — Z79899 Other long term (current) drug therapy: Secondary | ICD-10-CM | POA: Insufficient documentation

## 2021-12-05 DIAGNOSIS — I6523 Occlusion and stenosis of bilateral carotid arteries: Secondary | ICD-10-CM

## 2021-12-05 DIAGNOSIS — I6529 Occlusion and stenosis of unspecified carotid artery: Secondary | ICD-10-CM | POA: Diagnosis not present

## 2021-12-05 DIAGNOSIS — J9611 Chronic respiratory failure with hypoxia: Secondary | ICD-10-CM | POA: Diagnosis not present

## 2021-12-05 DIAGNOSIS — I452 Bifascicular block: Secondary | ICD-10-CM | POA: Diagnosis not present

## 2021-12-05 DIAGNOSIS — Z7901 Long term (current) use of anticoagulants: Secondary | ICD-10-CM | POA: Insufficient documentation

## 2021-12-05 DIAGNOSIS — J449 Chronic obstructive pulmonary disease, unspecified: Secondary | ICD-10-CM | POA: Diagnosis not present

## 2021-12-05 LAB — BASIC METABOLIC PANEL
Anion gap: 7 (ref 5–15)
BUN: 15 mg/dL (ref 8–23)
CO2: 27 mmol/L (ref 22–32)
Calcium: 8.9 mg/dL (ref 8.9–10.3)
Chloride: 103 mmol/L (ref 98–111)
Creatinine, Ser: 0.81 mg/dL (ref 0.44–1.00)
GFR, Estimated: >60 mL/min (ref >60)
Glucose, Bld: 98 mg/dL (ref 70–99)
Potassium: 4.4 mmol/L (ref 3.5–5.1)
Sodium: 137 mmol/L (ref 135–145)

## 2021-12-05 NOTE — Patient Instructions (Signed)
Thank you for coming in today  Labs were done today, if any labs are abnormal the clinic will call you No news is good news    Do the following things EVERYDAY: Weigh yourself in the morning before breakfast. Write it down and keep it in a log. Take your medicines as prescribed Eat low salt foods--Limit salt (sodium) to 2000 mg per day.  Stay as active as you can everyday Limit all fluids for the day to less than 2 liters  Your physician recommends that you schedule a follow-up appointment in:  4 months with Dr. Gala Romney   At the [facility], you and your health needs are our priority. As part of our continuing mission to provide you with exceptional heart care, we have created designated Provider Care Teams. These Care Teams include your primary Cardiologist (physician) and Advanced Practice Providers (APPs- Physician Assistants and Nurse Practitioners) who all work together to provide you with the care you need, when you need it.   You may see any of the following providers on your designated Care Team at your next follow up: Dr Arvilla Meres Dr Carron Curie, NP Robbie Lis, [place] [facility] [place], [place] Karle Plumber, PharmD   Please be sure to bring in all your medications bottles to every appointment.   If you have any questions or concerns before your next appointment please send [place] a message through Mobridge or call our office at [phone].    TO LEAVE A MESSAGE FOR THE NURSE SELECT OPTION 2, PLEASE LEAVE A MESSAGE INCLUDING: YOUR NAME DATE OF BIRTH CALL BACK NUMBER REASON FOR CALL**this is important as we prioritize the call backs  YOU WILL RECEIVE A CALL BACK THE SAME DAY AS LONG AS YOU CALL BEFORE 4:00 PM

## 2021-12-05 NOTE — Progress Notes (Signed)
[facility] Note    Primary Care: Paulina Fusi, MD  Primary Cardiologist: Formerly Gunnar Fusi [place]) - relocated to [place] 07/2020 HF Cardiologist: Dr. Gala Romney  HPI: Joan Smith is a 81 y.o. woman (family friend of Joan Smith in cath lab) referred by Dr. 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   Her husband died in 02/22/2012. Shortly after that admitted with acute HF with 25 pounds of fluid.   Echo 2013 EF 35% mild to moderate TR. Cath 2013 no CAD Felt to be Tako-tsubo vs PVC-mediated  Holter 1/12 PVC burden 19.7% Holter 11/13 PVC burden 21% Holter  11/21 PVC burden 4% (no ablation or AAD)   Echo 2021 EF 55% moderate MR mild PAH G2DD  Admitted in 1/22 with sepsis/PNA.   Previously managed at [facility] in [place] - relocated to [place] 07/2020 to live with her son.   Initial visit 10/22, NYHA II and volume stable. CMRI ordered to evaluate cause of CM.  Echo (10/22) EF 45-50% RV ok.  cMRI (1/23): difficult study, unable to calculate RVEF or LVEF;  - Noncoronary LGE pattern in the basal septum. The RV insertion sites with LGE can be nonspecific and suggest pressure/volume overload. However, the more extensive mid-wall basal septal LGE may be indicative of prior myocarditis.  Today she returns for HF follow up with her son. Overall feeling fine. Has chronic runny nose and allergies her are bothering her. She has been referred to ENT but has not been contacted to set up. Son says she eats and sleeps poorly, but lives by herself and drives some. He lives across the street and checks in on her. She has SOB walking up stairs or inclines, but no issues with ADLs or walking on flat ground. Feels occasional palpitations.  Denies abnormal bleeding, CP, dizziness, edema, or PND/Orthopnea. Appetite poor. No fever or chills. Weight at home 117-118 pounds. Taking all medications. Has not needed Lasix recently.  ROS: All systems reviewed and negative except as per HPI.   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     azelastine (ASTELIN) 0.1 % nasal spray Place 2 sprays into the nose in the morning and at bedtime.     bisoprolol (ZEBETA) 10 MG tablet NEEDS FOLLOW UP APPOINTMENT TAKE 1 TABLET(10 MG) BY MOUTH DAILY 90 tablet 0   Budeson-Glycopyrrol-Formoterol (BREZTRI AEROSPHERE) 160-9-4.8 MCG/ACT AERO Inhale 2 puffs into the lungs in the morning and at bedtime. 10.7 g 3   fexofenadine (ALLEGRA) 60 MG tablet Take 60 mg by mouth daily as needed for allergies or rhinitis.     fluticasone (FLONASE) 50 MCG/ACT  nasal spray Place 2 sprays into both nostrils daily.     furosemide (LASIX) 20 MG tablet Take 1 tablet (20 mg total) by mouth daily as needed. 30 tablet    levalbuterol (XOPENEX HFA) 45 MCG/ACT inhaler Inhale 2 puffs into the lungs as needed for wheezing or shortness of breath.     magnesium oxide (MAG-OX) 400 MG tablet Take 400 mg by mouth daily.     rosuvastatin (CRESTOR) 20 MG tablet Take 1 tablet by mouth at bedtime.     Spacer/Aero-Holding Rudean Curt Use with inhaler 1 each 2   spironolactone (ALDACTONE) 25 MG tablet  Take 0.5 tablets (12.5 mg total) by mouth daily. 15 tablet 6   No current facility-administered medications for this encounter.    Allergies  Allergen Reactions   Amoxicillin Diarrhea and Nausea And Vomiting   Ceclor [Cefaclor] Hives and Rash   Social History   Socioeconomic History   Marital status: Widowed    Spouse name: Not on file   Number of children: Not on file   Years of education: Not on file   Highest education level: Not on file  Occupational History   Not on file  Tobacco Use   Smoking status: Former    Packs/day: 1.50    Years: 50.00    Total pack years: 75.00    Types: Cigarettes    Quit date: 05/05/1999    Years since quitting: 22.6   Smokeless tobacco: Never  Substance and Sexual Activity   Alcohol use: Never   Drug use: Never   Sexual activity: Not on file  Other Topics Concern   Not on file  Social History Narrative   Not on file   Social Determinants of Health   Financial Resource Strain: Not on file  Food Insecurity: Not on file  Transportation Needs: Not on file  Physical Activity: Not on file  Stress: Not on file  Social Connections: Not on file  Intimate Partner Violence: Not on file   Family History  Problem Relation Age of Onset   COPD Mother    Diabetes Mellitus II Mother    CAD Mother    Heart failure Mother    CAD Father    Heart attack Father    CAD Sister    Cardiomyopathy Brother    BP (!) 118/56   Pulse (!) 58   Wt 54.7 kg (120 lb 9.6 oz)   SpO2 95%   BMI 21.36 kg/m   Wt Readings from Last 3 Encounters:  12/05/21 54.7 kg (120 lb 9.6 oz)  06/09/21 53.9 kg (118 lb 12.8 oz)  02/24/21 55.3 kg (122 lb)   PHYSICAL EXAM: General:  NAD. No resp difficulty, elderly, walked into clinic. HEENT: Normal Neck: Supple. No JVD. Carotids 2+ bilat; no bruits. No lymphadenopathy or thryomegaly appreciated. Cor: PMI nondisplaced. Irregular rate & rhythm. No rubs, gallops or murmurs. Lungs: Clear Abdomen: Soft, nontender,  nondistended. No hepatosplenomegaly. No bruits or masses. Good bowel sounds. Extremities: No cyanosis, clubbing, rash, edema Neuro: Alert & oriented x 3, cranial nerves grossly intact. Moves all 4 extremities w/o difficulty. Affect pleasant.  ECG (personally reviewed): SB w/ PVCs, rBBB and LAFB  ASSESSMENT & PLAN:  1. Chronic Systolic HF, with mildly reduced EF - Initial event certainly sounds suspicious for TakoTsubo following the death of her husband in 08-16-2011, but subsequently may have had a component of PVC-related CM - Echo 2011-08-16 EF 35% mild to moderate TR.  - Cath 16-Aug-2011 no CAD Felt  to be Tako-tsubo vs PVC-mediated - Holter 1/12 PVC burden 19.7% - Holter 11/13 PVC burden 21% - Holter 11/21 PVC burden 4% - Echo 2021 EF 55% moderate MR mild PAH G2DD - Echo 10/25/20 EF 45-50% RV ok - CMRI (1/23) unable to calculate LVEF or RVEF, extensive mid-wall basal septal LGE may be indicative of prior myocarditis. We discussed these results today. - Stable NYHA II. Volume status looks good. - Continue Lasix 20 mg PRN. Has not needed any recently. - Continue spiro 12.5 mg daily. - Continue bisoprolol 10 mg daily. - BP has not tolerated lisinopril. - No SGLT2i at this point with poor appetite and risk for volume depletion. Can reconsider as needed. - BMET today.  2. Frequent PVCs - Holters as above. Burden much improved.  - No change.  3. Carotid artery stenosis - Nonobstructive. - Continue ECASA/statin.  4. COPD with chronic hypoxic respiratory failure, on home O2 - Followed by Pulmonary (Dr. Tonia Brooms).  - No wheezing on exam.  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 - Stable.  Follow up in 4-6 months with Dr. Gala Romney.  Jacklynn Ganong, FNP  1:16 PM

## 2021-12-08 ENCOUNTER — Ambulatory Visit: Payer: PPO | Admitting: Podiatry

## 2021-12-08 ENCOUNTER — Encounter: Payer: Self-pay | Admitting: Podiatry

## 2021-12-08 DIAGNOSIS — L84 Corns and callosities: Secondary | ICD-10-CM | POA: Diagnosis not present

## 2021-12-08 DIAGNOSIS — M79674 Pain in right toe(s): Secondary | ICD-10-CM | POA: Diagnosis not present

## 2021-12-08 DIAGNOSIS — M79675 Pain in left toe(s): Secondary | ICD-10-CM

## 2021-12-08 DIAGNOSIS — I739 Peripheral vascular disease, unspecified: Secondary | ICD-10-CM

## 2021-12-08 DIAGNOSIS — B351 Tinea unguium: Secondary | ICD-10-CM

## 2021-12-08 NOTE — Progress Notes (Signed)
  Subjective:  Patient ID: Joan Smith, female    DOB: 1941/02/01,  MRN: [id number]  Joan Smith presents to clinic today for for at risk foot care. Patient has h/o PAD and callus(es) b/l lower extremities and painful thick toenails that are difficult to trim. Painful toenails interfere with ambulation. Aggravating factors include wearing enclosed shoe gear. Pain is relieved with periodic professional debridement. Painful calluses are aggravated when weightbearing with and without shoegear. Pain is relieved with periodic professional debridement.  PCP is Paulina Fusi, MD , and last visit was one month ago.  Allergies  Allergen Reactions   Amoxicillin Diarrhea and Nausea And Vomiting   Ceclor [Cefaclor] Hives and Rash    Review of Systems: Negative except as noted in the HPI.  Objective:  Constitutional Joan Smith is a pleasant 81 y.o. Caucasian female, in NAD. AAO x 3.   Vascular CFT <4 seconds b/l LE. Faintly palpable PT pulse(s) b/l LE. Diminished DP pulse(s) b/l LE. No pain with calf compression b/l. Lower extremity skin temperature gradient within normal limits. No edema noted b/l LE. No ischemia or gangrene noted b/l LE. No cyanosis or clubbing noted b/l LE.  Neurologic Normal speech. Oriented to person, place, and time. Protective sensation intact 5/5 intact bilaterally with 10g monofilament b/l. Vibratory sensation intact b/l.  Dermatologic Pedal integument with normal turgor, texture and tone BLE. Edema noted right 2nd toe with no ecchymosis. +Tenderness to palpation. No open wounds b/l LE. No interdigital macerations noted b/l LE. Toenails 1-5 bilaterally elongated, discolored, dystrophic, thickened, and crumbly with subungual debris and tenderness to dorsal palpation. Hyperkeratotic lesion(s) R 4th toe and submet head 3 b/l.  No erythema, no edema, no drainage, no fluctuance.  Orthopedic: Muscle strength 5/5 to all lower extremity muscle groups bilaterally. HAV with  bunion deformity noted b/l LE. Hammertoe deformity noted 2-5 b/l.   Radiographs: None  Assessment/Plan: 1. Pain due to onychomycosis of toenails of both feet   2. Corns and callosities   3. PAD (peripheral artery disease) (HCC)   -Patient was evaluated and treated. All patient's and/or POA's questions/concerns answered on today's visit. -We discussed injury to right 2nd toe. Patient declines xrays on today's visit. Instructed her on buddy splinting toe to adjacent toe and educated her on checking toe circulation as it pertains to dressing that is too tight. She will call office if condition worsens or does not improve. -Toenails 1-5 b/l were debrided in length and girth with sterile nail nippers and dremel without iatrogenic bleeding.  -Corn(s) R 4th toe and callus(es) submet head 3 b/l were pared utilizing sterile scalpel blade without incident. Total number debrided =3. -Patient/POA to call should there be question/concern in the interim.   Return in about 3 months (around 03/10/2022) for rfc.  Louann Sjogren, DPM

## 2021-12-11 ENCOUNTER — Encounter: Payer: Self-pay | Admitting: Pulmonary Disease

## 2021-12-11 ENCOUNTER — Ambulatory Visit: Payer: PPO | Admitting: Pulmonary Disease

## 2021-12-11 VITALS — BP 130/90 | HR 59 | Ht 63.0 in | Wt 118.2 lb

## 2021-12-11 DIAGNOSIS — J449 Chronic obstructive pulmonary disease, unspecified: Secondary | ICD-10-CM

## 2021-12-11 DIAGNOSIS — J310 Chronic rhinitis: Secondary | ICD-10-CM

## 2021-12-11 NOTE — Progress Notes (Signed)
Synopsis: Referred in May 2022 for COPD by Paulina Fusi, MD  Subjective:   PATIENT ID: Joan Smith GENDER: female DOB: July 26, 1940, 81 MRN: [id number]  Chief Complaint  Patient presents with   Follow-up    Follow-up:     PMH of COPD, asthma, CAD, PH, HTN. She was in the hospital for most of her childhood for asthma exacerbations on and off. Former smoker, 40+ years, 1.5 ppds. She has nocturnal hypoxemia and uses O2.  Past 2 weeks has had increased shortness of breath, cough sputum production.  She has had follow-up CT scans that were completed at in [place].  She had [facility] radiology center disc that she brought with her today.  We will have these images uploaded.  From respiratory standpoint she feels worse over the past week.  Thick sputum production.  No blood.  Denies hemoptysis  OV 01/20/2021: 81 Doing well today.  Here for follow-up regarding COPD.  Currently managed with Trelegy and as needed albuterol.  She was recently out in [place] visiting family.  She had a short hospitalization for pneumonia.  She is recovered well since then.  Was treated with antibiotics steroids.  OV 12/11/2021: Here today for follow-up.  Here today for follow-up.  Doing well with her Trelegy.  Overall has no significant complaints today.  Talked a lot about her nasal congestion and the fact that she is using a Nettie pot regularly as well as multiple nasal sprays.    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   Ventricular tachycardia (HCC) 10/27/2019     Family History   Problem Relation Age of Onset   COPD Mother    Diabetes Mellitus II Mother    CAD Mother    Heart failure Mother    CAD Father    Heart attack Father    CAD Sister    Cardiomyopathy Brother      Past Surgical History:  Procedure Laterality Date   ABDOMINAL HYSTERECTOMY      Social History   Socioeconomic History   Marital status: Widowed    Spouse name: Not on file   Number of children: Not on file   Years of education: Not on file   Highest education level: Not on file  Occupational History   Not on file  Tobacco Use   Smoking status: Former    Packs/day: 1.50    Years: 50.00    Total pack years: 75.00    Types: Cigarettes    Quit date: 05/05/1999    Years since quitting: 22.6   Smokeless tobacco: Never  Substance and Sexual Activity   Alcohol use: Never   Drug use: Never   Sexual activity: Not on file  Other Topics Concern   Not on file  Social History Narrative   Not on file   Social Determinants of Health   Financial Resource Strain: Not on file  Food Insecurity: Not on file  Transportation Needs: Not on file  Physical Activity: Not on file  Stress: Not on file  Social Connections: Not  on file  Intimate Partner Violence: Not on file     Allergies  Allergen Reactions   Amoxicillin Diarrhea and Nausea And Vomiting   Ceclor [Cefaclor] Hives and Rash     Outpatient Medications Prior to Visit  Medication Sig Dispense Refill   azelastine (ASTELIN) 0.1 % nasal spray Place 2 sprays into the nose in the morning and at bedtime.     fexofenadine (ALLEGRA) 60 MG tablet Take 60 mg by mouth daily as needed for allergies or rhinitis.     levalbuterol (XOPENEX HFA) 45 MCG/ACT inhaler Inhale 2 puffs into the lungs as needed for wheezing or shortness of breath.     aspirin 81 MG EC tablet Take 81 mg by mouth daily.     bisoprolol (ZEBETA) 10 MG tablet NEEDS FOLLOW UP APPOINTMENT TAKE 1 TABLET(10 MG) BY MOUTH DAILY 90 tablet 0   Budeson-Glycopyrrol-Formoterol  (BREZTRI AEROSPHERE) 160-9-4.8 MCG/ACT AERO Inhale 2 puffs into the lungs in the morning and at bedtime. 10.7 g 3   fluticasone (FLONASE) 50 MCG/ACT nasal spray Place 2 sprays into both nostrils daily.     furosemide (LASIX) 20 MG tablet Take 1 tablet (20 mg total) by mouth daily as needed. 30 tablet    magnesium oxide (MAG-OX) 400 MG tablet Take 400 mg by mouth daily.     rosuvastatin (CRESTOR) 20 MG tablet Take 1 tablet by mouth at bedtime.     Spacer/Aero-Holding Rudean Curt Use with inhaler 1 each 2   spironolactone (ALDACTONE) 25 MG tablet Take 0.5 tablets (12.5 mg total) by mouth daily. 15 tablet 6   No facility-administered medications prior to visit.    Review of Systems  Constitutional:  Negative for chills, fever, malaise/fatigue and weight loss.  HENT:  Positive for congestion. Negative for hearing loss, sore throat and tinnitus.   Eyes:  Negative for blurred vision and double vision.  Respiratory:  Negative for cough, hemoptysis, sputum production, shortness of breath, wheezing and stridor.   Cardiovascular:  Negative for chest pain, palpitations, orthopnea, leg swelling and PND.  Gastrointestinal:  Negative for abdominal pain, constipation, diarrhea, heartburn, nausea and vomiting.  Genitourinary:  Negative for dysuria, hematuria and urgency.  Musculoskeletal:  Negative for joint pain and myalgias.  Skin:  Negative for itching and rash.  Neurological:  Negative for dizziness, tingling, weakness and headaches.  Endo/Heme/Allergies:  Negative for environmental allergies. Does not bruise/bleed easily.  Psychiatric/Behavioral:  Negative for depression. The patient is not nervous/anxious and does not have insomnia.   All other systems reviewed and are negative.    Objective:  Physical Exam Vitals reviewed.  Constitutional:      General: She is not in acute distress.    Appearance: She is well-developed.  HENT:     Head: Normocephalic and atraumatic.  Eyes:     General:  No scleral icterus.    Conjunctiva/sclera: Conjunctivae normal.     Pupils: Pupils are equal, round, and reactive to light.  Neck:     Vascular: No JVD.     Trachea: No tracheal deviation.  Cardiovascular:     Rate and Rhythm: Normal rate and regular rhythm.     Heart sounds: Normal heart sounds. No murmur heard. Pulmonary:     Effort: Pulmonary effort is normal. No tachypnea, accessory muscle usage or respiratory distress.     Breath sounds: No stridor. No wheezing, rhonchi or rales.  Abdominal:     General: There is no distension.     Palpations: Abdomen is soft.  Tenderness: There is no abdominal tenderness.  Musculoskeletal:        General: No tenderness.     Cervical back: Neck supple.  Lymphadenopathy:     Cervical: No cervical adenopathy.  Skin:    General: Skin is warm and dry.     Capillary Refill: Capillary refill takes less than 2 seconds.     Findings: No rash.  Neurological:     Mental Status: She is alert and oriented to person, place, and time.  Psychiatric:        Behavior: Behavior normal.      Vitals:   12/11/21 1205  BP: (!) 130/90  Pulse: (!) 59  SpO2: 95%  Weight: 118 lb 3.2 oz (53.6 kg)  Height: 5\' 3"  (1.6 m)   95% on RA BMI Readings from Last 3 Encounters:  12/11/21 20.94 kg/m  12/05/21 21.36 kg/m  06/09/21 21.04 kg/m   Wt Readings from Last 3 Encounters:  12/11/21 118 lb 3.2 oz (53.6 kg)  12/05/21 120 lb 9.6 oz (54.7 kg)  06/09/21 118 lb 12.8 oz (53.9 kg)     CBC    Component Value Date/Time   WBC 8.0 05/21/2021 1015   WBC 11.3 (H) 08/14/2020 1253   RBC 4.72 05/21/2021 1015   RBC 4.59 08/14/2020 1253   HGB 14.2 05/21/2021 1015   HCT 43.2 05/21/2021 1015   PLT 239 05/21/2021 1015   MCV 92 05/21/2021 1015   MCH 30.1 05/21/2021 1015   MCH 27.9 08/14/2020 1253   MCHC 32.9 05/21/2021 1015   MCHC 30.0 08/14/2020 1253   RDW 12.9 05/21/2021 1015   LYMPHSABS 2.0 11/05/2020 1330   EOSABS 0.1 11/05/2020 1330   BASOSABS 0.0  11/05/2020 1330    Chest Imaging:  08/14/2020: Hyper-expanded, lung fields, no infiltrates The patient's images have been independently reviewed by me.    Pulmonary Functions Testing Results:     No data to display          FeNO:   Pathology:   Echocardiogram:   Heart Catheterization:     Assessment & Plan:     ICD-10-CM   1. Chronic obstructive pulmonary disease, unspecified COPD type (HCC)  J44.9     2. Chronic rhinitis  J31.0        Discussion:  This is an 81 year old gentleman history of COPD, history of asthma, former smoker, 40+ pack year history.  Doing well on triple therapy inhaler regimen with Trelegy.  Had no exacerbations in the past year.  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  She has lots of chronic rhinitis and congestion symptoms.  She also uses a Nettie pot and lots of nasal sprays regularly.  Plan: I recommended to cut back on the use of neti pot daily.  She has lots of drainage from the nose and it seems like it is not a congestion issue.  She may be overdoing it. She can continue her Trelegy Continue her nasal spray and hopefully maybe even try to cut that back some. She is on a follow-up with 97 in 1 year or as needed.    Current Outpatient Medications:    azelastine (ASTELIN) 0.1 % nasal spray, Place 2 sprays into the nose in the morning and at bedtime., Disp: , Rfl:    fexofenadine (ALLEGRA) 60 MG tablet, Take 60 mg by mouth daily as needed for allergies or rhinitis., Disp: ,  Rfl:    levalbuterol (XOPENEX HFA) 45 MCG/ACT inhaler, Inhale 2 puffs into the lungs as needed for wheezing or shortness of breath., Disp: , Rfl:    aspirin 81 MG EC tablet, Take 81 mg by mouth daily., Disp: , Rfl:    bisoprolol (ZEBETA) 10 MG tablet, NEEDS FOLLOW UP APPOINTMENT TAKE 1 TABLET(10 MG) BY MOUTH DAILY, Disp: 90 tablet, Rfl: 0    Budeson-Glycopyrrol-Formoterol (BREZTRI AEROSPHERE) 160-9-4.8 MCG/ACT AERO, Inhale 2 puffs into the lungs in the morning and at bedtime., Disp: 10.7 g, Rfl: 3   fluticasone (FLONASE) 50 MCG/ACT nasal spray, Place 2 sprays into both nostrils daily., Disp: , Rfl:    furosemide (LASIX) 20 MG tablet, Take 1 tablet (20 mg total) by mouth daily as needed., Disp: 30 tablet, Rfl:    magnesium oxide (MAG-OX) 400 MG tablet, Take 400 mg by mouth daily., Disp: , Rfl:    rosuvastatin (CRESTOR) 20 MG tablet, Take 1 tablet by mouth at bedtime., Disp: , Rfl:    Spacer/Aero-Holding Chambers DEVI, Use with inhaler, Disp: 1 each, Rfl: 2   spironolactone (ALDACTONE) 25 MG tablet, Take 0.5 tablets (12.5 mg total) by mouth daily., Disp: 15 tablet, Rfl: 6   Josephine Igo, DO [facility] 12/11/2021 12:25 PM

## 2021-12-11 NOTE — Patient Instructions (Addendum)
Thank you for visiting Dr. Tonia Brooms at [facility]. Today we recommend the following:  Continue current inhaler regimen  Continue nasal sprays as needed   Return in about 1 year (around 12/12/2022) for with APP or Dr. Tonia Brooms.    Please do your part to reduce the spread of COVID-19.

## 2021-12-13 NOTE — Progress Notes (Signed)
Subjective:  Patient ID: Joan Smith, female    DOB: [birth year],  MRN: [id number]  Joan Smith presents to clinic today for for at risk foot care. Patient has h/o PAD and corn(s) right lower extremity, callus(es) b/l lower extremities and painful mycotic nails.  Pain interferes with ambulation. Aggravating factors include wearing enclosed shoe gear. Painful toenails interfere with ambulation. Aggravating factors include wearing enclosed shoe gear. Pain is relieved with periodic professional debridement. Painful corns and calluses are aggravated when weightbearing with and without shoegear. Pain is relieved with periodic professional debridement.  Patient is accompanied by her son, Mr. Joan Smith, on today's visit. They would like her to continue her visits every 9 weeks due to her chronic foot issues.  [name] states she applied OTC corn remover pads to right 2nd and 3rd digits to resolve her corns. Areas where the corn pads were applied have turned white.   PCP is Paulina Fusi, MD , and last visit was  August 11, 2021  Allergies  Allergen Reactions   Amoxicillin Diarrhea and Nausea And Vomiting   Ceclor [Cefaclor] Hives and Rash    Review of Systems: Negative except as noted in the HPI.  Objective: No changes noted in today's physical examination. Constitutional Joan Smith is a pleasant 81 y.o. Caucasian female, in NAD. AAO x 3.   Vascular CFT <4 seconds b/l LE. Faintly palpable PT pulse(s) b/l LE. Diminished DP pulse(s) b/l LE. No pain with calf compression b/l. Lower extremity skin temperature gradient within normal limits. No edema noted b/l LE. No ischemia or gangrene noted b/l LE. No cyanosis or clubbing noted b/l LE.  Neurologic Normal speech. Oriented to person, place, and time. Protective sensation intact 5/5 intact bilaterally with 10g monofilament b/l. Vibratory sensation intact b/l.  Dermatologic Pedal integument thin and atrophic BLE. Evidence of topical  corn callus remover with macerated skin dorsal aspect right 2nd/3rd PIPJ. No erythema, no edema, no drainage and no underlying fluctuance. No ischemia noted. +Tenderness to palpation. No open wounds b/l LE. No interdigital macerations noted b/l LE. Toenails 1-5 bilaterally elongated, discolored, dystrophic, thickened, and crumbly with subungual debris and tenderness to dorsal palpation. Porokeratotic submet head 3 b/l.  No erythema, no edema, no drainage, no fluctuance.  Orthopedic: Muscle strength 5/5 to all lower extremity muscle groups bilaterally. HAV with bunion deformity noted b/l LE. Hammertoe deformity noted 2-5 b/l.   Radiographs: None Assessment/Plan: 1. Pain due to onychomycosis of toenails of both feet   2. Porokeratosis   3. Superficial injury of skin   4. PAD (peripheral artery disease) (HCC)   -Patient's family member present. All questions/concerns addressed on today's visit. -Counseled patient/family on dangers of using OTC corn/callus removers. She is to apply Betadine solution to right 3rd/4th digits once daily. -Mycotic toenails 1-5 bilaterally were debrided in length and girth with sterile nail nippers and dremel without incident. -Porokeratotic lesion(s) submet head 3 b/l pared and enucleated with sterile scalpel blade without incident. Total number of lesions debrided=2. -Dispensed list of shoe recommendations. -Patient/POA to call should there be question/concern in the interim.  -Follow up with Dr. Ralene Cork in one week for toe check of right 3rd and right 4th digits. -Patient desires appointments every 9 weeks. I do not have any availability in 9 weeks on my schedule and she will see Dr. Annamary Rummage in 9 weeks and may see me again the following 9 weeks and going forward. Return in about 9 weeks (around 02/05/2022).  Victorino Dike  Tawni Carnes, DPM

## 2021-12-22 DIAGNOSIS — J9611 Chronic respiratory failure with hypoxia: Secondary | ICD-10-CM | POA: Diagnosis not present

## 2021-12-22 DIAGNOSIS — Z87891 Personal history of nicotine dependence: Secondary | ICD-10-CM | POA: Diagnosis not present

## 2021-12-22 DIAGNOSIS — Z7982 Long term (current) use of aspirin: Secondary | ICD-10-CM | POA: Diagnosis not present

## 2021-12-22 DIAGNOSIS — B372 Candidiasis of skin and nail: Secondary | ICD-10-CM | POA: Diagnosis not present

## 2021-12-22 DIAGNOSIS — E261 Secondary hyperaldosteronism: Secondary | ICD-10-CM | POA: Diagnosis not present

## 2021-12-22 DIAGNOSIS — E785 Hyperlipidemia, unspecified: Secondary | ICD-10-CM | POA: Diagnosis not present

## 2021-12-22 DIAGNOSIS — J449 Chronic obstructive pulmonary disease, unspecified: Secondary | ICD-10-CM | POA: Diagnosis not present

## 2021-12-22 DIAGNOSIS — I5022 Chronic systolic (congestive) heart failure: Secondary | ICD-10-CM | POA: Diagnosis not present

## 2021-12-22 DIAGNOSIS — I10 Essential (primary) hypertension: Secondary | ICD-10-CM | POA: Diagnosis not present

## 2021-12-22 DIAGNOSIS — I110 Hypertensive heart disease with heart failure: Secondary | ICD-10-CM | POA: Diagnosis not present

## 2021-12-22 DIAGNOSIS — J309 Allergic rhinitis, unspecified: Secondary | ICD-10-CM | POA: Diagnosis not present

## 2021-12-22 DIAGNOSIS — R32 Unspecified urinary incontinence: Secondary | ICD-10-CM | POA: Diagnosis not present

## 2022-01-01 DIAGNOSIS — L570 Actinic keratosis: Secondary | ICD-10-CM | POA: Diagnosis not present

## 2022-01-01 DIAGNOSIS — R233 Spontaneous ecchymoses: Secondary | ICD-10-CM | POA: Diagnosis not present

## 2022-01-01 DIAGNOSIS — L821 Other seborrheic keratosis: Secondary | ICD-10-CM | POA: Diagnosis not present

## 2022-01-03 DIAGNOSIS — J449 Chronic obstructive pulmonary disease, unspecified: Secondary | ICD-10-CM | POA: Diagnosis not present

## 2022-01-07 ENCOUNTER — Other Ambulatory Visit: Payer: Self-pay | Admitting: *Deleted

## 2022-01-07 MED ORDER — BISOPROLOL FUMARATE 10 MG PO TABS: ORAL_TABLET | ORAL | 2 refills | Status: DC

## 2022-01-19 ENCOUNTER — Telehealth: Payer: Self-pay | Admitting: Pulmonary Disease

## 2022-01-19 DIAGNOSIS — J441 Chronic obstructive pulmonary disease with (acute) exacerbation: Secondary | ICD-10-CM

## 2022-01-19 MED ORDER — BREZTRI AEROSPHERE 160-9-4.8 MCG/ACT IN AERO: 2.0000 | INHALATION_SPRAY | Freq: Two times a day (BID) | RESPIRATORY_TRACT | 3 refills | Status: DC

## 2022-01-19 NOTE — Telephone Encounter (Signed)
Refills sent to pharmacy. Nothing further needed.  

## 2022-01-20 ENCOUNTER — Telehealth: Payer: Self-pay | Admitting: *Deleted

## 2022-01-20 NOTE — Patient Outreach (Signed)
ERROR

## 2022-01-20 NOTE — Patient Outreach (Signed)
  Care Coordination   01/20/2022 Name: Joan Smith MRN: [id number] DOB: January 31, 1941   Care Coordination Outreach Attempts:  An unsuccessful telephone outreach was attempted today to offer the patient information about available care coordination services as a benefit of their health plan.   Follow Up Plan:  Additional outreach attempts will be made to offer the patient care coordination information and services.   Encounter Outcome:  No Answer  Care Coordination Interventions Activated:  No   Care Coordination Interventions:  No, not indicated    Eduard Clos MSW, LCSW Licensed Clinical Social Worker      [phone]

## 2022-01-21 ENCOUNTER — Telehealth: Payer: Self-pay | Admitting: *Deleted

## 2022-01-21 NOTE — Patient Outreach (Signed)
  Care Coordination   Initial Visit Note   01/21/2022 Name: Merry Pond Sparkman MRN: [id number] DOB: 12-Feb-1941  Murial Beam Andersson is a 81 y.o. year old female who sees Nicoletta Dress, MD for primary care. I spoke with  Delia Heady Candella by phone today.  What matters to the patients health and wellness today?  Pt declines Care Coordination services.     Goals Addressed   None     SDOH assessments and interventions completed:  No     Care Coordination Interventions Activated:  No  Care Coordination Interventions:  No, not indicated   Follow up plan: No further intervention required.   Encounter Outcome:  Pt. Refused  Eduard Clos MSW, LCSW Licensed Clinical Social Worker      [phone]

## 2022-01-28 DIAGNOSIS — Z713 Dietary counseling and surveillance: Secondary | ICD-10-CM | POA: Diagnosis not present

## 2022-01-28 DIAGNOSIS — E638 Other specified nutritional deficiencies: Secondary | ICD-10-CM | POA: Diagnosis not present

## 2022-01-28 DIAGNOSIS — E785 Hyperlipidemia, unspecified: Secondary | ICD-10-CM | POA: Diagnosis not present

## 2022-02-02 DIAGNOSIS — J449 Chronic obstructive pulmonary disease, unspecified: Secondary | ICD-10-CM | POA: Diagnosis not present

## 2022-02-19 DIAGNOSIS — I517 Cardiomegaly: Secondary | ICD-10-CM | POA: Diagnosis not present

## 2022-02-19 DIAGNOSIS — F5104 Psychophysiologic insomnia: Secondary | ICD-10-CM | POA: Diagnosis not present

## 2022-02-19 DIAGNOSIS — J449 Chronic obstructive pulmonary disease, unspecified: Secondary | ICD-10-CM | POA: Diagnosis not present

## 2022-02-19 DIAGNOSIS — J849 Interstitial pulmonary disease, unspecified: Secondary | ICD-10-CM | POA: Diagnosis not present

## 2022-02-19 DIAGNOSIS — R0789 Other chest pain: Secondary | ICD-10-CM | POA: Diagnosis not present

## 2022-02-19 DIAGNOSIS — R918 Other nonspecific abnormal finding of lung field: Secondary | ICD-10-CM | POA: Diagnosis not present

## 2022-02-19 DIAGNOSIS — Z23 Encounter for immunization: Secondary | ICD-10-CM | POA: Diagnosis not present

## 2022-02-19 DIAGNOSIS — R911 Solitary pulmonary nodule: Secondary | ICD-10-CM | POA: Diagnosis not present

## 2022-02-20 DIAGNOSIS — R918 Other nonspecific abnormal finding of lung field: Secondary | ICD-10-CM | POA: Diagnosis not present

## 2022-02-20 DIAGNOSIS — J439 Emphysema, unspecified: Secondary | ICD-10-CM | POA: Diagnosis not present

## 2022-02-20 DIAGNOSIS — R911 Solitary pulmonary nodule: Secondary | ICD-10-CM | POA: Diagnosis not present

## 2022-02-24 ENCOUNTER — Encounter: Payer: Self-pay | Admitting: Pulmonary Disease

## 2022-03-04 ENCOUNTER — Other Ambulatory Visit (HOSPITAL_COMMUNITY): Payer: Self-pay | Admitting: Pharmacist

## 2022-03-04 MED ORDER — BISOPROLOL FUMARATE 10 MG PO TABS: 10.0000 mg | ORAL_TABLET | Freq: Every day | ORAL | 2 refills | Status: DC

## 2022-03-05 DIAGNOSIS — J449 Chronic obstructive pulmonary disease, unspecified: Secondary | ICD-10-CM | POA: Diagnosis not present

## 2022-03-11 DIAGNOSIS — R0602 Shortness of breath: Secondary | ICD-10-CM | POA: Diagnosis not present

## 2022-03-11 DIAGNOSIS — J189 Pneumonia, unspecified organism: Secondary | ICD-10-CM | POA: Diagnosis not present

## 2022-03-11 DIAGNOSIS — E639 Nutritional deficiency, unspecified: Secondary | ICD-10-CM | POA: Diagnosis not present

## 2022-03-11 DIAGNOSIS — R059 Cough, unspecified: Secondary | ICD-10-CM | POA: Diagnosis not present

## 2022-03-19 ENCOUNTER — Ambulatory Visit: Payer: PPO | Admitting: Podiatry

## 2022-03-19 ENCOUNTER — Encounter: Payer: Self-pay | Admitting: Podiatry

## 2022-03-19 DIAGNOSIS — M79674 Pain in right toe(s): Secondary | ICD-10-CM | POA: Diagnosis not present

## 2022-03-19 DIAGNOSIS — M79675 Pain in left toe(s): Secondary | ICD-10-CM

## 2022-03-19 DIAGNOSIS — L84 Corns and callosities: Secondary | ICD-10-CM | POA: Diagnosis not present

## 2022-03-19 DIAGNOSIS — B351 Tinea unguium: Secondary | ICD-10-CM

## 2022-03-19 DIAGNOSIS — I739 Peripheral vascular disease, unspecified: Secondary | ICD-10-CM

## 2022-03-19 DIAGNOSIS — Q828 Other specified congenital malformations of skin: Secondary | ICD-10-CM | POA: Diagnosis not present

## 2022-03-19 NOTE — Progress Notes (Signed)
  Subjective:  Patient ID: Joan Smith, female    DOB: 1940-08-14,  MRN: [id number]  Joan Smith presents to clinic today for at risk foot care. Patient has h/o PAD and corn(s)  left lower extremity, porokeratotic lesion(s) b/l lower extremities and painful mycotic nails. Painful toenails interfere with ambulation. Aggravating factors include wearing enclosed shoe gear. Pain is relieved with periodic professional debridement. Painful corns and porokeratotic lesion(s) aggravated when weightbearing with and without shoegear. Pain is relieved with periodic professional debridement.  Her son is present during today's visit. Patient continues to have discomfort of left lateral malleolus. States she has been wearing heel protector on and off. Denies any redness, drainage or swelling of digit. She has not used the chemical corn remover on her toes since our last visit.  Chief Complaint  Patient presents with   Nail Problem    Routine foot care PCP-Joan Smith PCP VST-1 month ago   New problem(s): None.   PCP is Joan Fusi, MD .  Allergies  Allergen Reactions   Amoxicillin Diarrhea and Nausea And Vomiting   Ceclor [Cefaclor] Hives and Rash    Review of Systems: Negative except as noted in the HPI.  Objective:   Joan Smith is a pleasant 81 y.o. female WD, WN in NAD. AAO x 3.  Vascular CFT <4 seconds b/l LE. Faintly palpable PT pulse(s) b/l LE. Diminished DP pulse(s) b/l LE. No pain with calf compression b/l. Lower extremity skin temperature gradient within normal limits. No edema noted b/l LE. No ischemia or gangrene noted b/l LE. No cyanosis or clubbing noted b/l LE.  Neurologic Normal speech. Oriented to person, place, and time. Protective sensation intact 5/5 intact bilaterally with 10g monofilament b/l. Vibratory sensation intact b/l.  Dermatologic Pedal integument thin and atrophic BLE.   Hyperkeratosis noted lateral malleolus left lower extremity. It is a small layer  with no surrounding erythema, edema, drainage or fluctuance.  HKT 2nd/3rd PIPJ. No erythema, no edema, no drainage and no underlying fluctuance. No ischemia noted. +Tenderness to palpation. No open wounds b/l LE. No interdigital macerations noted b/l LE.   Toenails 1-5 bilaterally elongated, discolored, dystrophic, thickened, and crumbly with subungual debris and tenderness to dorsal palpation.   Porokeratotic submet head 3 b/l.  No erythema, no edema, no drainage, no fluctuance.  Orthopedic: Muscle strength 5/5 to all lower extremity muscle groups bilaterally. HAV with bunion deformity noted b/l LE. Hammertoe deformity noted 2-5 b/l.   Radiographs: None Assessment/Plan: 1. Pain due to onychomycosis of toenails of both feet   2. Porokeratosis   3. Corns   4. PAD (peripheral artery disease) (HCC)     No orders of the defined types were placed in this encounter.   -Patient's family member present. All questions/concerns addressed on today's visit. -Continue heel protector left foot when in bed. Discussed Derm consult for evaluation of lesion. -Patient to continue soft, supportive shoe gear daily. -Mycotic toenails 1-5 bilaterally were debrided in length and girth with sterile nail nippers and dremel without incident. -Porokeratotic lesion(s) submet head 3 b/l pared and enucleated with sterile currette without incident. Total number of lesions debrided=2. -Patient/POA to call should there be question/concern in the interim.   Return in about 3 months (around 06/19/2022).  Joan Smith, DPM

## 2022-03-30 DIAGNOSIS — J329 Chronic sinusitis, unspecified: Secondary | ICD-10-CM | POA: Diagnosis not present

## 2022-04-04 DIAGNOSIS — J449 Chronic obstructive pulmonary disease, unspecified: Secondary | ICD-10-CM | POA: Diagnosis not present

## 2022-04-08 ENCOUNTER — Ambulatory Visit (HOSPITAL_COMMUNITY)
Admission: RE | Admit: 2022-04-08 | Discharge: 2022-04-08 | Disposition: A | Discharge status: Home / Self Care | Payer: PPO | Source: Ambulatory Visit | Attending: Internal Medicine | Admitting: Internal Medicine

## 2022-04-08 ENCOUNTER — Encounter (HOSPITAL_COMMUNITY): Payer: Self-pay | Admitting: Internal Medicine

## 2022-04-08 VITALS — BP 110/68 | HR 54 | Wt 117.0 lb

## 2022-04-08 DIAGNOSIS — Z9981 Dependence on supplemental oxygen: Secondary | ICD-10-CM | POA: Diagnosis not present

## 2022-04-08 DIAGNOSIS — J9611 Chronic respiratory failure with hypoxia: Secondary | ICD-10-CM | POA: Diagnosis not present

## 2022-04-08 DIAGNOSIS — I4510 Unspecified right bundle-branch block: Secondary | ICD-10-CM | POA: Diagnosis not present

## 2022-04-08 DIAGNOSIS — I5022 Chronic systolic (congestive) heart failure: Secondary | ICD-10-CM | POA: Insufficient documentation

## 2022-04-08 DIAGNOSIS — J4489 Other specified chronic obstructive pulmonary disease: Secondary | ICD-10-CM | POA: Diagnosis not present

## 2022-04-08 DIAGNOSIS — Z79899 Other long term (current) drug therapy: Secondary | ICD-10-CM | POA: Diagnosis not present

## 2022-04-08 DIAGNOSIS — I6529 Occlusion and stenosis of unspecified carotid artery: Secondary | ICD-10-CM | POA: Diagnosis not present

## 2022-04-08 DIAGNOSIS — R079 Chest pain, unspecified: Secondary | ICD-10-CM | POA: Diagnosis not present

## 2022-04-08 DIAGNOSIS — I110 Hypertensive heart disease with heart failure: Secondary | ICD-10-CM | POA: Insufficient documentation

## 2022-04-08 DIAGNOSIS — Z87891 Personal history of nicotine dependence: Secondary | ICD-10-CM | POA: Insufficient documentation

## 2022-04-08 DIAGNOSIS — R0789 Other chest pain: Secondary | ICD-10-CM | POA: Insufficient documentation

## 2022-04-08 DIAGNOSIS — I493 Ventricular premature depolarization: Secondary | ICD-10-CM | POA: Diagnosis not present

## 2022-04-08 DIAGNOSIS — J8410 Pulmonary fibrosis, unspecified: Secondary | ICD-10-CM | POA: Insufficient documentation

## 2022-04-08 NOTE — Progress Notes (Signed)
Advanced Heart Failure Clinic Note    Primary Care: Paulina Fusi, MD  Primary Cardiologist: Formerly Gunnar Fusi [facility] [place]) - relocated to [place] 07/2020 HF Cardiologist: Dr. Gala Romney  HPI: Joan Smith is a 81 y.o. woman (family friend of Cristy Friedlander in cath lab) referred by Dr. 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   Her husband died in Feb 10, 2012. Shortly after that admitted with acute HF with 25 pounds of fluid.   Echo 2013 EF 35% mild to moderate TR. Cath 2013 no CAD Felt to be Tako-tsubo vs PVC-mediated  Holter 1/12 PVC burden 19.7% Holter 11/13 PVC burden 21% Holter  11/21 PVC burden 4% (no ablation or AAD)   Echo 2021 EF 55% moderate MR mild PAH G2DD  Previously managed at [facility] in [place] - relocated to [place] 07/2020 to live with her son.   Initial visit 10/22, NYHA II and volume stable. cMRI ordered to evaluate cause of CM.  Echo (10/22) EF 45-50% RV ok.  cMRI (1/23): difficult study, unable to calculate RVEF or LVEF;  - Noncoronary LGE pattern in the basal septum. The RV insertion sites with LGE can be nonspecific and suggest pressure/volume overload. However, the more extensive mid-wall basal septal LGE may be indicative of prior myocarditis.  Today she returns for HF follow up with her family. Feels ok but tires easily. About 1-2x/week she gets an abrupt pressure in her chest like someone is pushing on her. More frequent when exerting herself. Lasts 5 mins and goes away. Has had diaphoresis about 2x with it.  Relatively stable pattern. No nocturnal symptoms.   ROS: All systems reviewed and negative except as per HPI.   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     azelastine (ASTELIN) 0.1 % nasal spray Place 2 sprays into the nose in the morning and at bedtime.     bisoprolol (ZEBETA) 10 MG tablet Take 1 tablet (10 mg total) by mouth daily. 90 tablet 2   Budeson-Glycopyrrol-Formoterol (BREZTRI AEROSPHERE) 160-9-4.8 MCG/ACT AERO Inhale 2 puffs into the lungs in the morning and at bedtime. 10.7 g 3   cetirizine (ZYRTEC) 10 MG tablet Take 10 mg by mouth daily.     fluticasone (FLONASE) 50 MCG/ACT nasal spray Place 2 sprays into both nostrils daily.     furosemide (LASIX) 20 MG tablet Take 1 tablet (20 mg total) by mouth daily as needed. 30 tablet    levalbuterol (XOPENEX HFA) 45 MCG/ACT inhaler Inhale 2 puffs into the lungs as needed for wheezing or shortness of breath.     magnesium oxide (MAG-OX) 400 MG tablet Take 400 mg by  mouth daily.     rosuvastatin (CRESTOR) 20 MG tablet Take 1 tablet by mouth at bedtime.     Spacer/Aero-Holding Rudean Curt Use with inhaler 1 each 2   spironolactone (ALDACTONE) 25 MG tablet Take 0.5 tablets (12.5 mg total) by mouth daily. 15 tablet 6   No current facility-administered medications for this encounter.    Allergies  Allergen Reactions   Amoxicillin Diarrhea and Nausea And Vomiting   Ceclor [Cefaclor] Hives and Rash   Social History   Socioeconomic History   Marital status: Widowed    Spouse name: Not on file   Number of children: Not on file   Years of education: Not on  file   Highest education level: Not on file  Occupational History   Not on file  Tobacco Use   Smoking status: Former    Packs/day: 1.50    Years: 50.00    Total pack years: 75.00    Types: Cigarettes    Quit date: 05/05/1999    Years since quitting: 22.9   Smokeless tobacco: Never  Substance and Sexual Activity   Alcohol use: Never   Drug use: Never   Sexual activity: Not on file  Other Topics Concern   Not on file  Social History Narrative   Not on file   Social Determinants of Health   Financial Resource Strain: Not on file  Food Insecurity: Not on file  Transportation Needs: Not on file  Physical Activity: Not on file  Stress: Not on file  Social Connections: Not on file  Intimate Partner Violence: Not on file   Family History  Problem Relation Age of Onset   COPD Mother    Diabetes Mellitus II Mother    CAD Mother    Heart failure Mother    CAD Father    Heart attack Father    CAD Sister    Cardiomyopathy Brother    BP 110/68   Pulse (!) 54   Wt 53.1 kg (117 lb)   SpO2 97%   BMI 20.73 kg/m   Wt Readings from Last 3 Encounters:  04/08/22 53.1 kg (117 lb)  12/11/21 53.6 kg (118 lb 3.2 oz)  12/05/21 54.7 kg (120 lb 9.6 oz)   PHYSICAL EXAM: General:  Elderly No resp difficulty HEENT: normal Neck: supple. no JVD. Carotids 2+ bilat; no bruits. No lymphadenopathy or thryomegaly appreciated. Cor: PMI nondisplaced. Regular rate & rhythm. No rubs, gallops or murmurs. Lungs: decreased throughout Abdomen: soft, nontender, nondistended. No hepatosplenomegaly. No bruits or masses. Good bowel sounds. Extremities: no cyanosis, clubbing, rash, edema Neuro: alert & orientedx3, cranial nerves grossly intact. moves all 4 extremities w/o difficulty. 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 Chronic Systolic HF, with mildly reduced EF - Initial event certainly sounds suspicious for TakoTsubo following the death of her  husband in 08-Sep-2011, but subsequently may have had a component of PVC-related CM - Echo 08-Sep-2011 EF 35% mild to moderate TR.  - Cath 09/08/11 no CAD Felt to be Tako-tsubo vs PVC-mediated - Holter 1/12 PVC burden 19.7% - Holter 11/13 PVC burden 21% - Holter 11/21 PVC burden 4% - Echo Sep 08, 2019 EF 55% moderate MR mild PAH G2DD - Echo 10/25/20 EF 45-50% RV ok - cMRI (1/23) unable to calculate LVEF or RVEF, extensive mid-wall basal septal LGE may be indicative of prior myocarditis. We discussed these results today. - Stable NYHA II. Volume status looks good. - Continue Lasix 20  mg PRN. Has not needed any recently. - Continue spiro 12.5 mg daily. - Continue bisoprolol 10 mg daily. - BP has not tolerated lisinopril. - No SGLT2i at this point with poor appetite and risk for volume depletion. Can reconsider as needed.  2. Exertional chest tightness. - cath 2013 no CAD - CT 2021 minimal coronary calcifications - We reviewed options of stress testing, cath or coronary CT - She feels it may be anxiety. Want to defer testing for now. If symptoms progress they will let me know.   3. Frequent PVCs - Holters as above. Burden much improved.  - No change  4. Carotid artery stenosis - Nonobstructive. Asymptomatic - Continue ECASA/statin.  4. COPD with chronic hypoxic respiratory failure, on home O2 - Followed by Pulmonary (Dr. Tonia Brooms).  - No wheezing on exam. 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 - Stable.   Joan Meres, MD  10:50 AM

## 2022-04-08 NOTE — Patient Instructions (Signed)
It was great to see you today! No medication changes are needed at this time.   Your physician wants you to follow-up in: 9 months with Dr Gala Romney. You will receive a reminder letter in the mail two months in advance. If you don't receive a letter, please call our office to schedule the follow-up appointment.   Do the following things EVERYDAY: Weigh yourself in the morning before breakfast. Write it down and keep it in a log. Take your medicines as prescribed Eat low salt foods--Limit salt (sodium) to 2000 mg per day.  Stay as active as you can everyday Limit all fluids for the day to less than 2 liters At the [facility], you and your health needs are our priority. As part of our continuing mission to provide you with exceptional heart care, we have created designated Provider Care Teams. These Care Teams include your primary Cardiologist (physician) and Advanced Practice Providers (APPs- Physician Assistants and Nurse Practitioners) who all work together to provide you with the care you need, when you need it.   You may see any of the following providers on your designated Care Team at your next follow up: Dr Arvilla Meres Dr Marca Ancona Dr. Marcos Eke, NP Robbie Lis, [place] [facility] [employer], [place] Brynda Peon, NP Karle Plumber, PharmD   Please be sure to bring in all your medications bottles to every appointment.

## 2022-04-28 IMAGING — MR MR CARD MORPHOLOGY WO/W CM
45 of 48 series · 45 of 48 positions shown · IV contrast (Gadavist)
Comparison: none

CLINICAL DATA: Nonischemic cardiomyopathy

EXAM:
CARDIAC MRI
TECHNIQUE: The patient was scanned on a 1.5 Tesla GE magnet. A dedicated
cardiac coil was used. Functional imaging was done using Fiesta
sequences. [DATE], and 4 chamber views were done to assess for RWMA's.
Modified Ashikuni rule using a short axis stack was used to
calculate an ejection fraction on a dedicated work station using
Circle software. The patient received 8 cc of Gadavist. After 10
minutes inversion recovery sequences were used to assess for
infiltration and scar tissue.

[Series 4: t2_haste_db_tra_bh · axial · 8.0mm · 1.41mm/px · 1 of 16 slices shown]
[im 1/16]
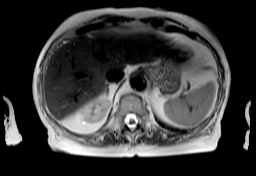

[Series 8: bSSFP · sagittal · 8.0mm · 1.61mm/px · 1 of 24 slices shown (1 of 20)]
[im 1/24]
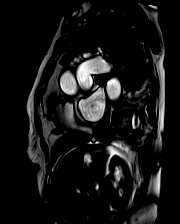

[Series 9: bSSFP · sagittal · 8.0mm · 1.61mm/px · 1 of 24 slices shown (2 of 20)]
[im 1/24]
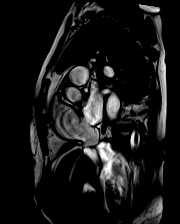

[Series 10: bSSFP · sagittal · 8.0mm · 1.61mm/px · 1 of 24 slices shown (3 of 20)]
[im 1/24]
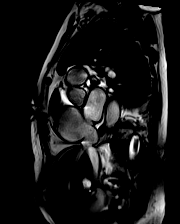

[Series 11: bSSFP · sagittal · 8.0mm · 1.61mm/px · 1 of 24 slices shown (4 of 20)]
[im 1/24]
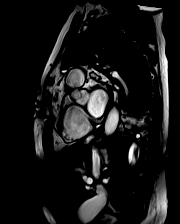

[Series 12: bSSFP · sagittal · 8.0mm · 1.61mm/px · 1 of 24 slices shown (5 of 20)]
[im 1/24]
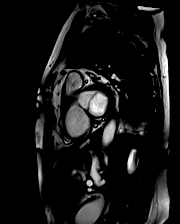

[Series 13: bSSFP · sagittal · 8.0mm · 1.61mm/px · 1 of 24 slices shown (6 of 20)]
[im 1/24]
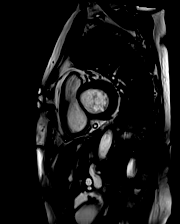

[Series 14: bSSFP · sagittal · 8.0mm · 1.61mm/px · 1 of 24 slices shown (7 of 20)]
[im 1/24]
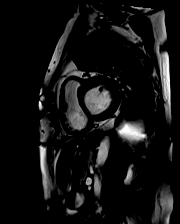

[Series 15: bSSFP · sagittal · 8.0mm · 1.61mm/px · 1 of 24 slices shown (8 of 20)]
[im 1/24]
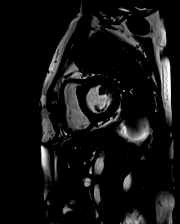

[Series 16: bSSFP · sagittal · 8.0mm · 1.61mm/px · 1 of 24 slices shown (9 of 20)]
[im 1/24]
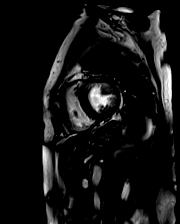

[Series 17: bSSFP · sagittal · 8.0mm · 1.61mm/px · 1 of 24 slices shown (10 of 20)]
[im 1/24]
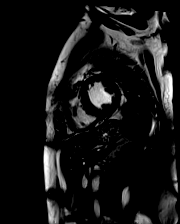

[Series 18: bSSFP · sagittal · 8.0mm · 1.61mm/px · 1 of 24 slices shown (11 of 20)]
[im 1/24]
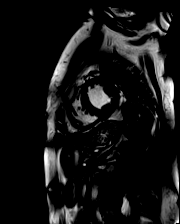

[Series 19: bSSFP · sagittal · 8.0mm · 1.61mm/px · 1 of 24 slices shown (12 of 20)]
[im 1/24]
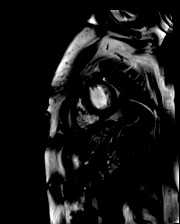

[Series 20: bSSFP · sagittal · 8.0mm · 1.61mm/px · 1 of 24 slices shown (13 of 20)]
[im 1/24]
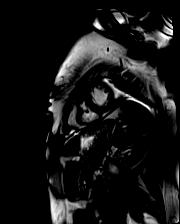

[Series 21: bSSFP · sagittal · 8.0mm · 1.61mm/px · 1 of 24 slices shown (14 of 20)]
[im 1/24]
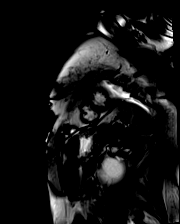

[Series 22: bSSFP · sagittal · 8.0mm · 1.61mm/px · 1 of 24 slices shown (15 of 20)]
[im 1/24]
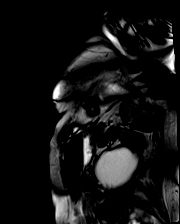

[Series 23: bSSFP · sagittal · 8.0mm · 1.61mm/px · 1 of 24 slices shown (16 of 20)]
[im 1/24]
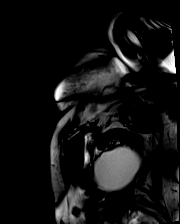

[Series 24: bSSFP · sagittal · 8.0mm · 1.61mm/px · 1 of 24 slices shown (17 of 20)]
[im 1/24]
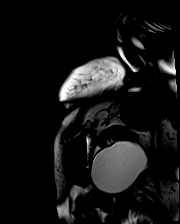

[Series 25: (id)_long_t1 · sagittal · 8.0mm · 1.48mm/px · 1 of 24 slices shown]
[im 1/24]
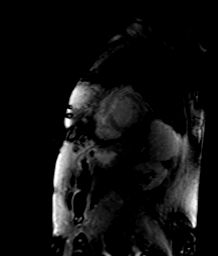

[Series 26: (id)_long_t1_moco · sagittal · 8.0mm · 1.48mm/px · 1 of 24 slices shown]
[im 1/24]
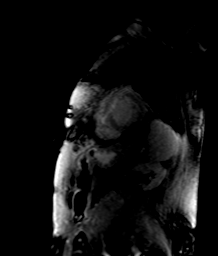

[Series 27: (id)_long_t1_moco_t1 · sagittal · 8.0mm · 1.48mm/px · 1 of 3 slices shown]
[im 1/3]
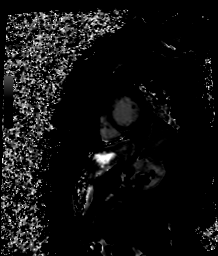

[Series 29: (id)_trufi · sagittal · 8.0mm · 1.98mm/px · 1 of 9 slices shown]
[im 1/9]
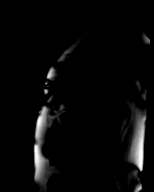

[Series 30: (id)_trufi_moco · sagittal · 8.0mm · 1.98mm/px · 1 of 9 slices shown]
[im 1/9]
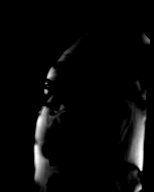

[Series 31: (id)_trufi_moco_t2 · sagittal · 8.0mm · 1.98mm/px · 1 of 3 slices shown]
[im 1/3]
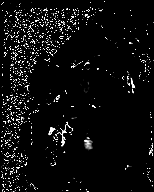

[Series 33: bSSFP · oblique · 6.0mm · 1.41mm/px · 1 of 23 slices shown (18 of 20)]
[im 1/23]
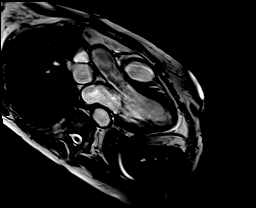

[Series 34: bSSFP · coronal · 6.0mm · 1.41mm/px · 1 of 15 slices shown (19 of 20)]
[im 1/15]
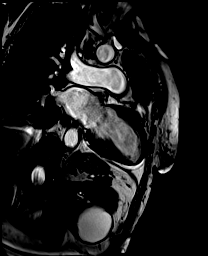

[Series 35: bSSFP · oblique · 6.0mm · 1.41mm/px · 1 of 24 slices shown (20 of 20)]
[im 1/24]
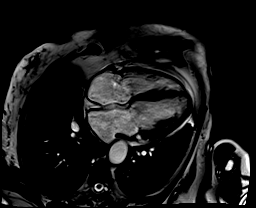

[Series 36: cine_trufi_cs_rt_short axis · sagittal · 8.0mm · 1.73mm/px · 1 of 5 slices shown (1 of 15)]
[im 1/5]
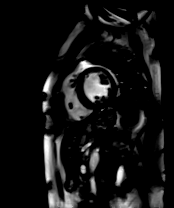

[Series 36: cine_trufi_cs_rt_short axis · sagittal · 8.0mm · 1.73mm/px · 1 of 5 slices shown (2 of 15)]
[im 1/5]
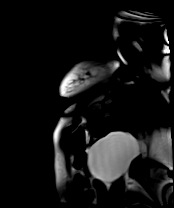

[Series 36: cine_trufi_cs_rt_short axis · sagittal · 8.0mm · 1.73mm/px · 1 of 5 slices shown (3 of 15)]
[im 1/5]
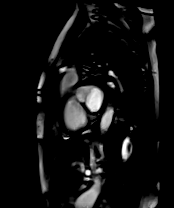

[Series 36: cine_trufi_cs_rt_short axis · sagittal · 8.0mm · 1.73mm/px · 1 of 5 slices shown (4 of 15)]
[im 1/5]
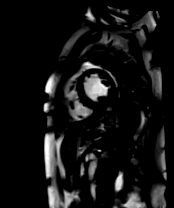

[Series 36: cine_trufi_cs_rt_short axis · sagittal · 8.0mm · 1.73mm/px · 1 of 5 slices shown (5 of 15)]
[im 1/5]
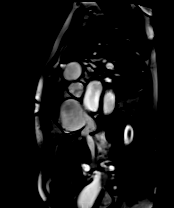

[Series 36: cine_trufi_cs_rt_short axis · sagittal · 8.0mm · 1.73mm/px · 1 of 5 slices shown (6 of 15)]
[im 1/5]
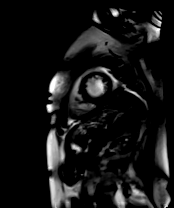

[Series 36: cine_trufi_cs_rt_short axis · sagittal · 8.0mm · 1.73mm/px · 1 of 5 slices shown (7 of 15)]
[im 1/5]
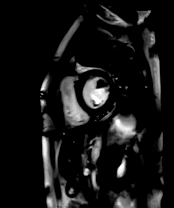

[Series 36: cine_trufi_cs_rt_short axis · sagittal · 8.0mm · 1.73mm/px · 1 of 5 slices shown (8 of 15)]
[im 1/5]
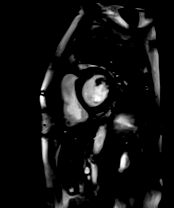

[Series 36: cine_trufi_cs_rt_short axis · sagittal · 8.0mm · 1.73mm/px · 1 of 5 slices shown (9 of 15)]
[im 1/5]
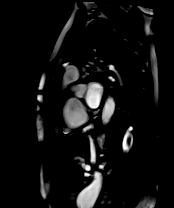

[Series 36: cine_trufi_cs_rt_short axis · sagittal · 8.0mm · 1.73mm/px · 1 of 5 slices shown (10 of 15)]
[im 1/5]
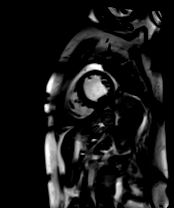

[Series 36: cine_trufi_cs_rt_short axis · sagittal · 8.0mm · 1.73mm/px · 1 of 5 slices shown (11 of 15)]
[im 1/5]
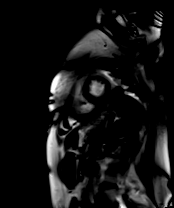

[Series 36: cine_trufi_cs_rt_short axis · sagittal · 8.0mm · 1.73mm/px · 1 of 5 slices shown (12 of 15)]
[im 1/5]
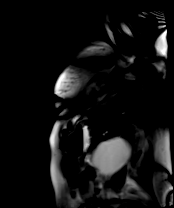

[Series 36: cine_trufi_cs_rt_short axis · sagittal · 8.0mm · 1.73mm/px · 1 of 5 slices shown (13 of 15)]
[im 1/5]
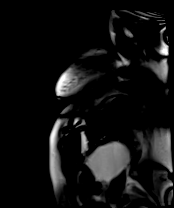

[Series 36: cine_trufi_cs_rt_short axis · sagittal · 8.0mm · 1.73mm/px · 1 of 5 slices shown (14 of 15)]
[im 1/5]
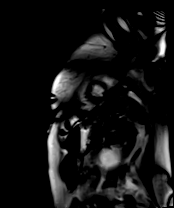

[Series 36: cine_trufi_cs_rt_short axis · sagittal · 8.0mm · 1.73mm/px · 1 of 5 slices shown (15 of 15)]
[im 1/5]
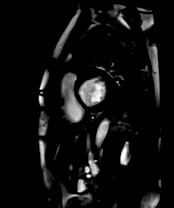

[Series 37: pre short axis · sagittal · non-contrast · 8.0mm · 2.25mm/px · 1 of 10 slices shown (1 of 3)]
[im 1/10]
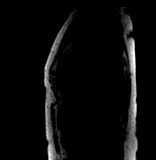

[Series 38: pre short axis · sagittal · non-contrast · 8.0mm · 2.25mm/px · 1 of 10 slices shown (2 of 3)]
[im 1/10]
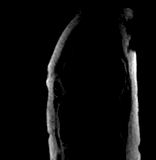

[Series 39: pre short axis · sagittal · non-contrast · 8.0mm · 2.25mm/px · 1 of 10 slices shown (3 of 3)]
[im 1/10]
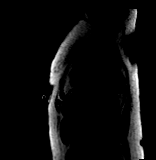

[45 of 48 positions shown; findings below may reference images not displayed]

FINDINGS: Limited images of the lung fields show coarse lung markings, patient
has history of COPD.

Very difficult study. Cine images do not appear to trigger correctly
and are not interpretable. Unable to calculate LV and RV EF.

Delayed enhancement images are difficult but there appears to be mid
wall late gadolinium enhancement (LGE) with a basal septal stripe as
well as LGE at the basal inferoseptal and basal anteroseptal RV
insertion sites.

T1 parametrics also do not appear usable.
IMPRESSION: 1. Technically very difficult study. Cine images are not
interpretable. I am attempting to contact the tech to see if there
is a patient factor causing this that will make an adequate MRI
difficult to obtain or whether we should repeat the study.

2. Noncoronary LGE pattern in the basal septum. The RV insertion
sites with LGE can be nonspecific and suggest pressure/volume
overload. However, the more extensive mid-wall basal septal LGE may
be indicative of prior myocarditis.

Szebenyi Ecsegi

## 2022-05-05 ENCOUNTER — Other Ambulatory Visit: Payer: Self-pay | Admitting: Pulmonary Disease

## 2022-05-05 DIAGNOSIS — J441 Chronic obstructive pulmonary disease with (acute) exacerbation: Secondary | ICD-10-CM

## 2022-05-05 DIAGNOSIS — J449 Chronic obstructive pulmonary disease, unspecified: Secondary | ICD-10-CM | POA: Diagnosis not present

## 2022-05-07 ENCOUNTER — Other Ambulatory Visit: Payer: Self-pay | Admitting: *Deleted

## 2022-05-07 MED ORDER — SPIRONOLACTONE 25 MG PO TABS: 12.5000 mg | ORAL_TABLET | Freq: Every day | ORAL | 6 refills | Status: DC

## 2022-05-11 DIAGNOSIS — M8589 Other specified disorders of bone density and structure, multiple sites: Secondary | ICD-10-CM | POA: Diagnosis not present

## 2022-05-11 DIAGNOSIS — I10 Essential (primary) hypertension: Secondary | ICD-10-CM | POA: Diagnosis not present

## 2022-05-11 DIAGNOSIS — E785 Hyperlipidemia, unspecified: Secondary | ICD-10-CM | POA: Diagnosis not present

## 2022-05-11 DIAGNOSIS — I5022 Chronic systolic (congestive) heart failure: Secondary | ICD-10-CM | POA: Diagnosis not present

## 2022-05-11 DIAGNOSIS — J449 Chronic obstructive pulmonary disease, unspecified: Secondary | ICD-10-CM | POA: Diagnosis not present

## 2022-05-15 IMAGING — DX DG CHEST 2V
2 series · 2 of 2 positions shown · non-contrast
Comparison: 08/14/2020

CLINICAL DATA: Cough, shortness of breath

EXAM:
CHEST - 2 VIEW

[chest pa]
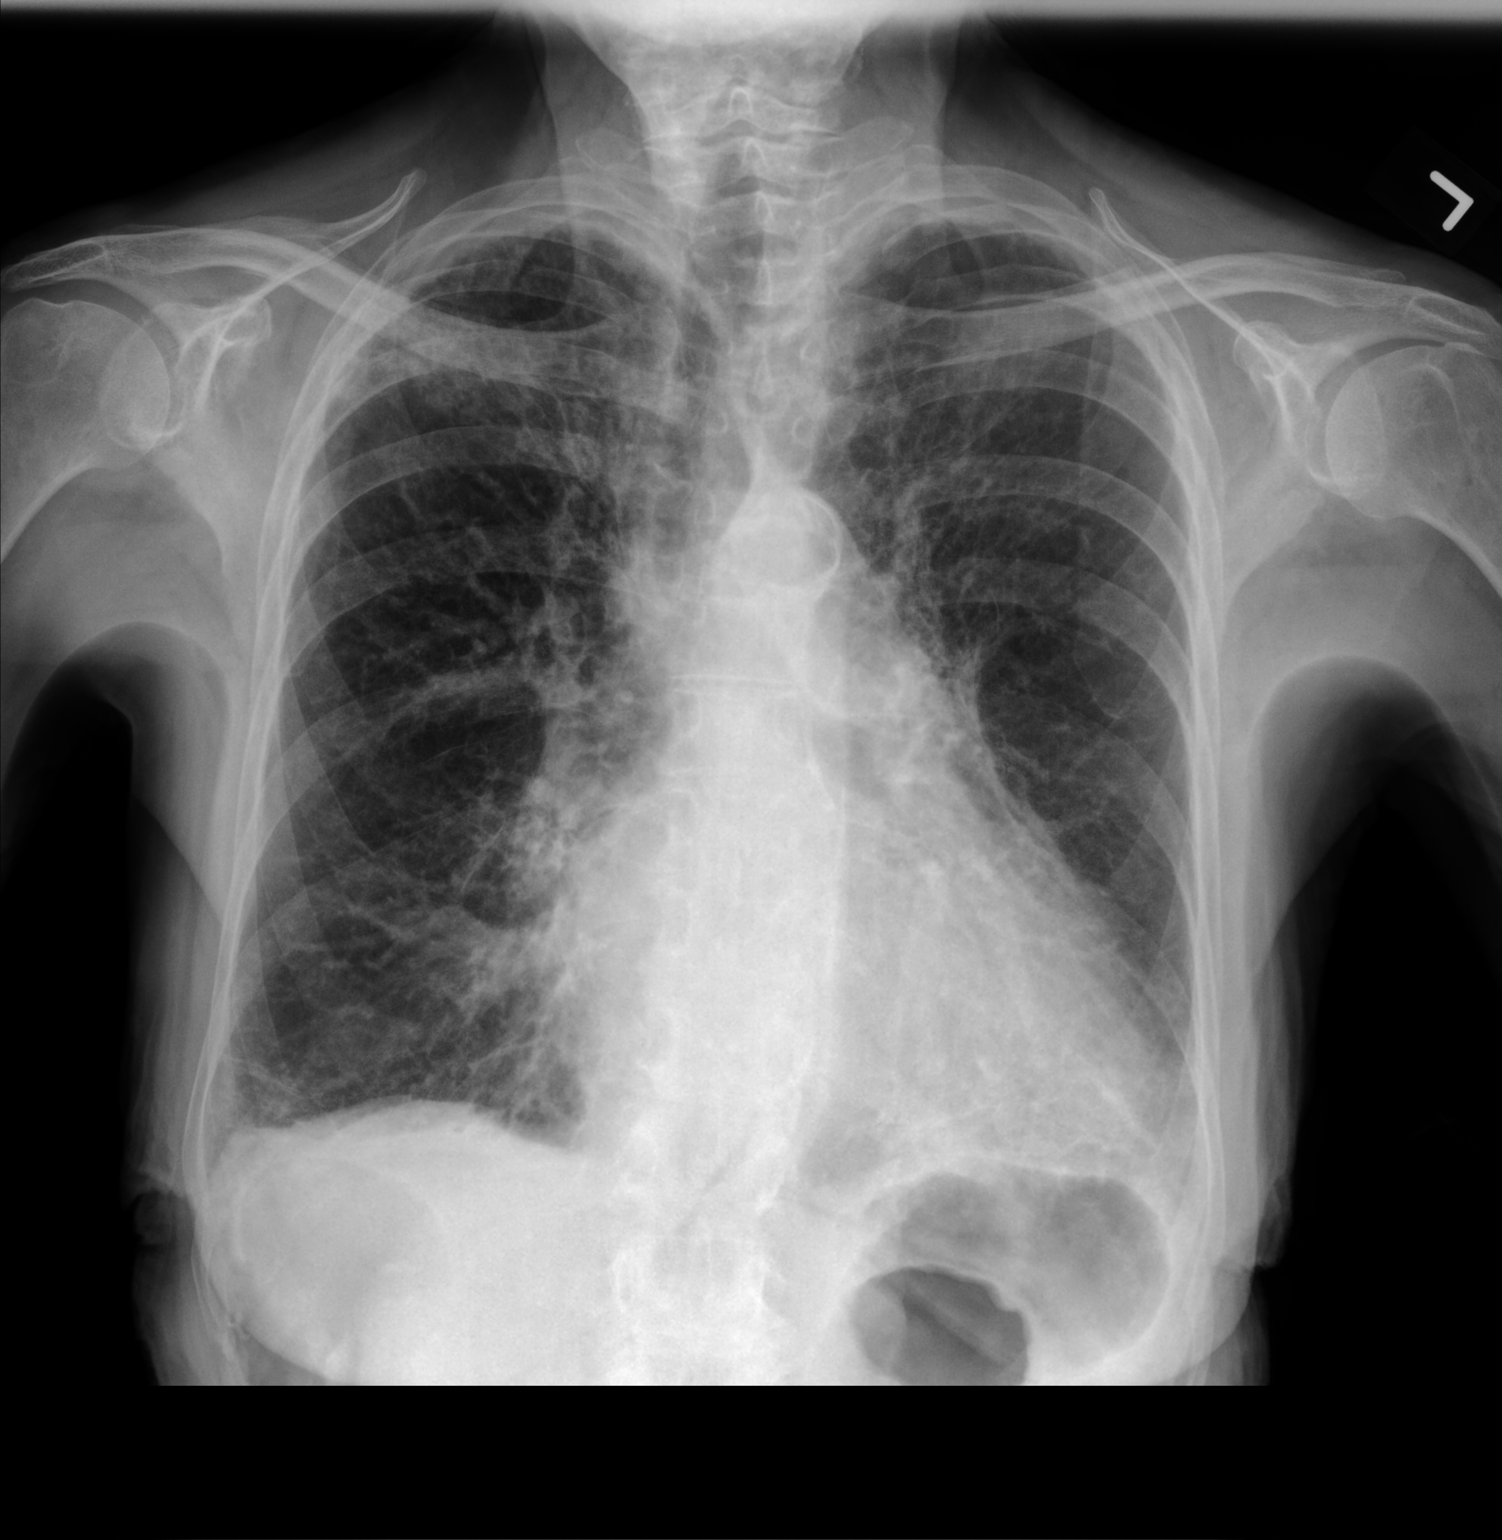

[chest lat]
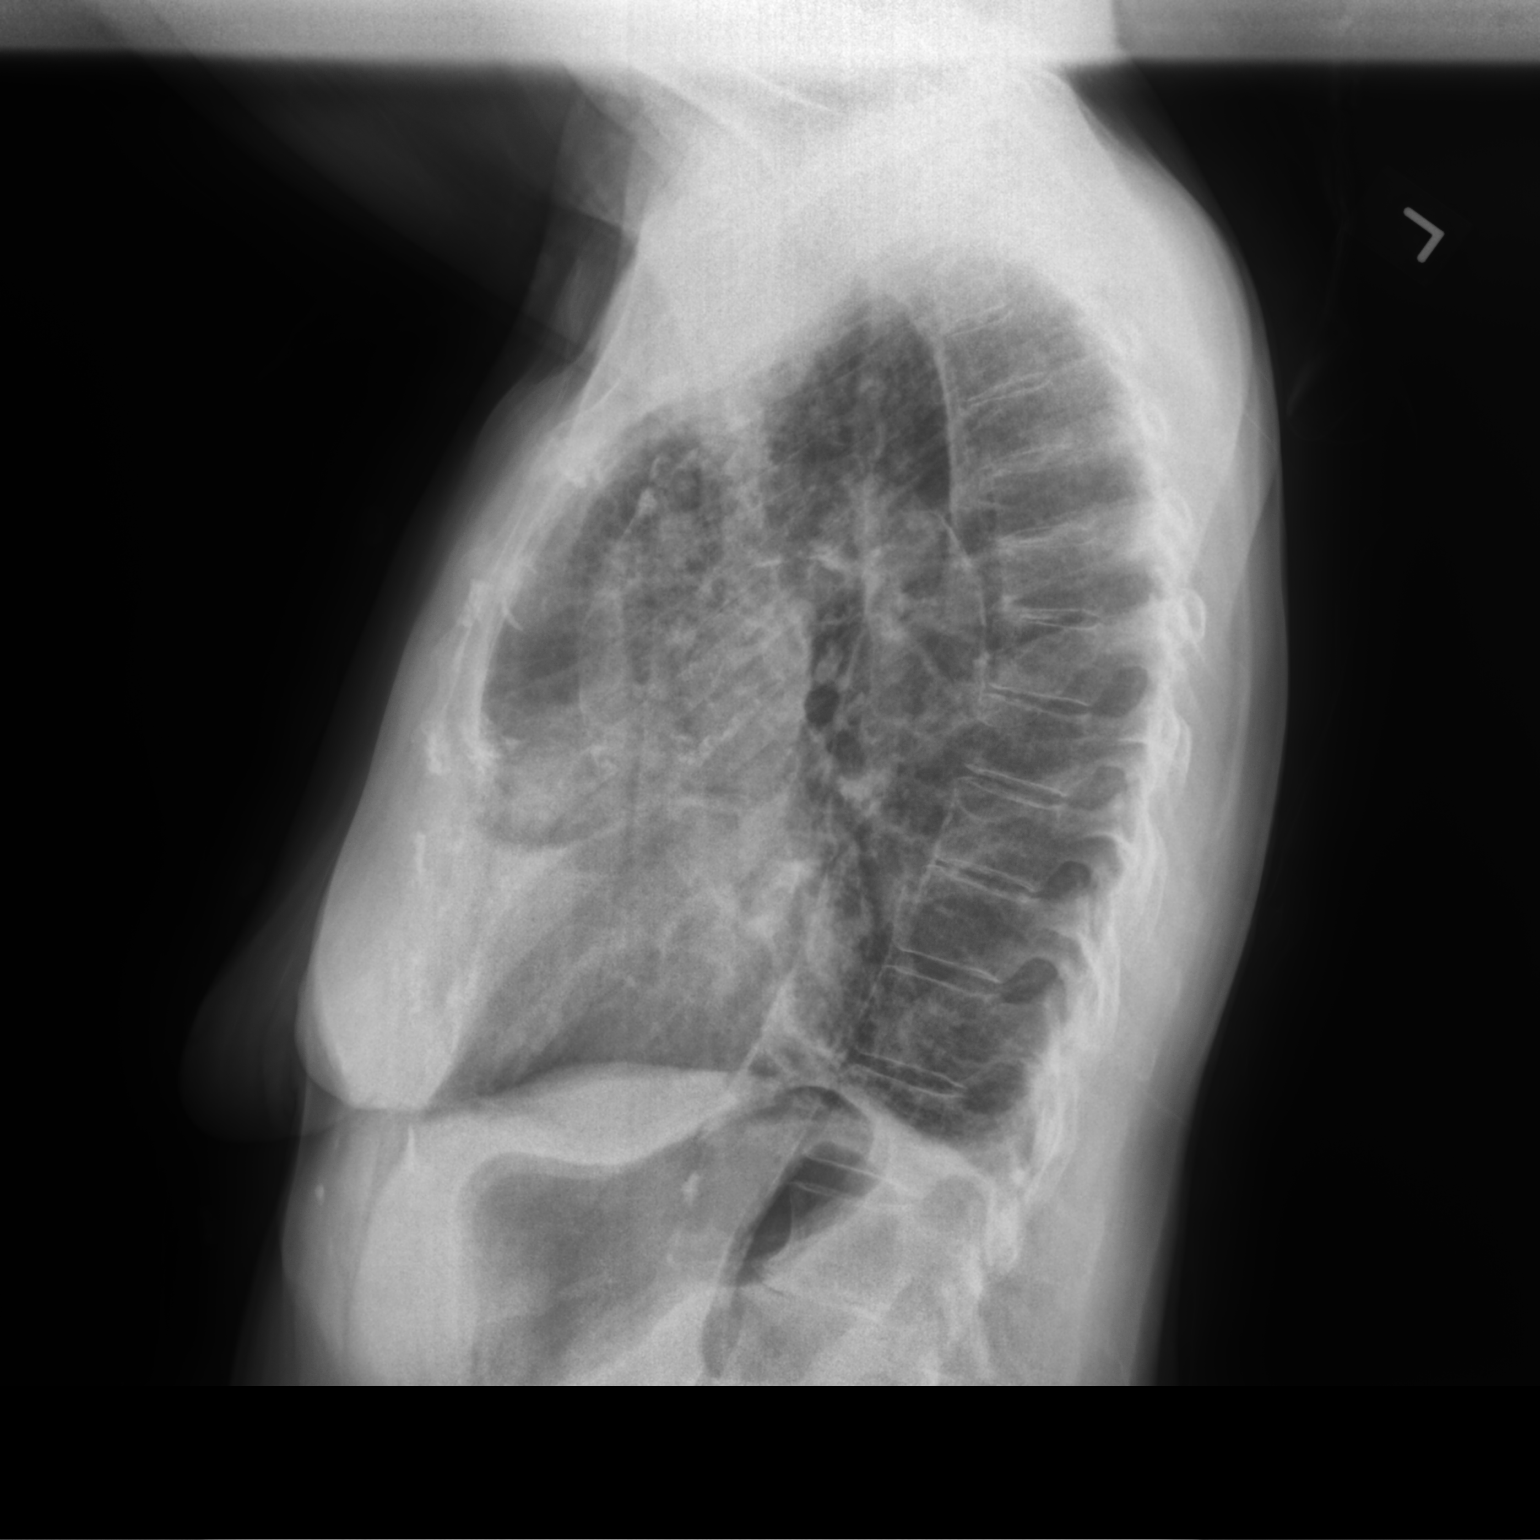

[2 of 2 positions shown; findings below may reference images not displayed]

FINDINGS: Stable cardiomegaly. Aortic atherosclerosis. Hyperexpanded lung
fields with coarsened interstitial markings bilaterally, similar to
prior. Area of increased density at the right lung apex likely
related to prominence of the right first rib costochondral junction
although underlying parenchymal opacity at this site is not entirely
excluded. Unchanged areas of linear scarring in the lung bases. No
pleural effusion or pneumothorax.
IMPRESSION: 1. Area of increased density at the right lung apex likely related
to prominence of the right first rib costochondral junction although
underlying parenchymal opacity at this site is not entirely
excluded. Consider follow-up apical lordotic chest radiograph for
further evaluation.
2. Cardiomegaly and findings of pulmonary fibrosis, similar in
appearance to prior.

## 2022-05-28 ENCOUNTER — Telehealth: Payer: Self-pay | Admitting: Pulmonary Disease

## 2022-05-28 NOTE — Telephone Encounter (Signed)
Called and spoke with pt's son Dominica Severin letting him know the info per Dr. Valeta Harms and he verbalized understanding. Nothing further needed.

## 2022-05-28 NOTE — Telephone Encounter (Signed)
Joan Smith son is returning phone call. Joan Smith phone number is [phone].

## 2022-05-28 NOTE — Telephone Encounter (Signed)
LMOM for Pt's son to return call to let him know what Dr Valeta Harms.

## 2022-05-28 NOTE — Telephone Encounter (Signed)
Called and spoke with patient's son Joan Smith. He stated that his mom has not been feeling well for the past few months but hasn't worse over the last few days. She has been having more SOB episodes, especially with exertion. She has been having to use her Xopenex more frequently as well. He denied any increased wheezing, chest pain or chest pressure. Her cough has not changed as she is still coughing up thick clear phlegm. Increased fatigue. Denied any fevers or body aches.   He did confirm that she is still using her Breztri twice daily.   He wanted to know if BI had any recommendations for her. He was unsure of this was just a normal COPD process or if something else could be brewing.   I did attempt to make an appt for her but he wanted to let her know that he had called the office first.   BI, can you please advise? Thanks!

## 2022-06-05 DIAGNOSIS — J449 Chronic obstructive pulmonary disease, unspecified: Secondary | ICD-10-CM | POA: Diagnosis not present

## 2022-06-17 ENCOUNTER — Telehealth: Payer: Self-pay | Admitting: Pulmonary Disease

## 2022-06-17 DIAGNOSIS — J9611 Chronic respiratory failure with hypoxia: Secondary | ICD-10-CM

## 2022-06-17 NOTE — Telephone Encounter (Signed)
pt son called stating that they need a referral/PA sent to  [employer] for her POC & her liter flow that she sleeps with. states that she has being getting the equipment @ a reduce price but in order to continue to it cheaper they need that information authorization from here [id number]

## 2022-06-18 NOTE — Telephone Encounter (Signed)
Called and spoke with patient's son. He stated that his mother needs a new order for oxygen in order for her insurance to pay for it. He stated that she has a POC and home concentrator. Per son, she is on 2L of O2 with the POC and 2L of O2 on the home concentrator at night. Her DME is [employer].   I looked at her chart and we have never ordered oxygen for her before.   BI, please advise if you can ok with [place] ordering the O2.

## 2022-06-19 ENCOUNTER — Ambulatory Visit: Payer: PPO | Admitting: Podiatry

## 2022-06-19 NOTE — Telephone Encounter (Signed)
Left detailed msg for son to be aware

## 2022-06-19 NOTE — Telephone Encounter (Signed)
Garner Nash, DO  to Valerie Salts, CMA  [facility] Triage Pool     06/19/22  8:11 AM  Yes, ok for [place] to order her O2 supplies  Thanks  BLI   Garner Nash, DO [facility] 06/19/2022 8:11 AM   Order was placed.

## 2022-06-22 ENCOUNTER — Ambulatory Visit: Payer: PPO | Admitting: Podiatry

## 2022-06-22 DIAGNOSIS — Q828 Other specified congenital malformations of skin: Secondary | ICD-10-CM | POA: Diagnosis not present

## 2022-06-22 DIAGNOSIS — M79675 Pain in left toe(s): Secondary | ICD-10-CM

## 2022-06-22 DIAGNOSIS — B351 Tinea unguium: Secondary | ICD-10-CM

## 2022-06-22 DIAGNOSIS — M79674 Pain in right toe(s): Secondary | ICD-10-CM | POA: Diagnosis not present

## 2022-06-22 DIAGNOSIS — L84 Corns and callosities: Secondary | ICD-10-CM

## 2022-06-22 DIAGNOSIS — I739 Peripheral vascular disease, unspecified: Secondary | ICD-10-CM

## 2022-06-22 NOTE — Progress Notes (Signed)
  Subjective:  Patient ID: Joan Smith, female    DOB: 04-14-1941,  MRN: [id number]  Chief Complaint  Patient presents with   Nail Problem    Nail calluses     82 y.o. female presents with the above complaint. History confirmed with patient. Patient presenting with pain related to dystrophic thickened elongated nails. Patient is unable to trim own nails related to nail dystrophy and/or mobility issues. Patient does not have a history of T2DM.  Patient does have a history of peripheral arterial disease.  Patient does have callus present located at the bilateral plantar forefoot underlying the second metatarsal head causing pain.   Objective:  Physical Exam: warm, good capillary refill nail exam onychomycosis of the toenails, onycholysis, and dystrophic nails protective sensation intact, DP and PT pulses diminished bilaterally Left Foot:  Pain with palpation of nails due to elongation and dystrophic growth.  Hyperkeratotic lesion subsecond metatarsal head with pain on palpation Right Foot: Pain with palpation of nails due to elongation and dystrophic growth.  Hyperkeratotic lesion subsecond metatarsal head with pain on palpation  Assessment:   1. Pain due to onychomycosis of toenails of both feet   2. Porokeratosis   3. Corns   4. PAD (peripheral artery disease) ([place])      Plan:  Patient was evaluated and treated and all questions answered.  #Hyperkeratotic lesions/pre ulcerative calluses present plantar aspect second metatarsal head bilateral foot All symptomatic hyperkeratoses x 2 separate lesions were safely debrided with a sterile #10 blade to patient's level of comfort without incident. We discussed preventative and palliative care of these lesions including supportive and accommodative shoegear, padding, prefabricated and custom molded accommodative orthoses, use of a pumice stone and lotions/creams daily.  #Onychomycosis with pain  -Nails palliatively debrided as  below. -Educated on self-care  Procedure: Nail Debridement Rationale: Pain Type of Debridement: manual, sharp debridement. Instrumentation: Nail nipper, rotary burr. Number of Nails: 10  Return in about 3 months (around 09/20/2022) for RFC.         Everitt Amber, DPM [facility]

## 2022-07-04 DIAGNOSIS — J449 Chronic obstructive pulmonary disease, unspecified: Secondary | ICD-10-CM | POA: Diagnosis not present

## 2022-08-03 DIAGNOSIS — E785 Hyperlipidemia, unspecified: Secondary | ICD-10-CM | POA: Diagnosis not present

## 2022-08-03 DIAGNOSIS — J9611 Chronic respiratory failure with hypoxia: Secondary | ICD-10-CM | POA: Diagnosis not present

## 2022-08-03 DIAGNOSIS — J309 Allergic rhinitis, unspecified: Secondary | ICD-10-CM | POA: Diagnosis not present

## 2022-08-03 DIAGNOSIS — J449 Chronic obstructive pulmonary disease, unspecified: Secondary | ICD-10-CM | POA: Diagnosis not present

## 2022-08-03 DIAGNOSIS — D6869 Other thrombophilia: Secondary | ICD-10-CM | POA: Diagnosis not present

## 2022-08-03 DIAGNOSIS — I110 Hypertensive heart disease with heart failure: Secondary | ICD-10-CM | POA: Diagnosis not present

## 2022-08-03 DIAGNOSIS — I4891 Unspecified atrial fibrillation: Secondary | ICD-10-CM | POA: Diagnosis not present

## 2022-08-03 DIAGNOSIS — J439 Emphysema, unspecified: Secondary | ICD-10-CM | POA: Diagnosis not present

## 2022-08-03 DIAGNOSIS — Z7982 Long term (current) use of aspirin: Secondary | ICD-10-CM | POA: Diagnosis not present

## 2022-08-03 DIAGNOSIS — I10 Essential (primary) hypertension: Secondary | ICD-10-CM | POA: Diagnosis not present

## 2022-08-03 DIAGNOSIS — I509 Heart failure, unspecified: Secondary | ICD-10-CM | POA: Diagnosis not present

## 2022-08-03 DIAGNOSIS — E261 Secondary hyperaldosteronism: Secondary | ICD-10-CM | POA: Diagnosis not present

## 2022-08-04 DIAGNOSIS — J449 Chronic obstructive pulmonary disease, unspecified: Secondary | ICD-10-CM | POA: Diagnosis not present

## 2022-08-07 DIAGNOSIS — D509 Iron deficiency anemia, unspecified: Secondary | ICD-10-CM | POA: Diagnosis not present

## 2022-08-07 DIAGNOSIS — R0602 Shortness of breath: Secondary | ICD-10-CM | POA: Diagnosis not present

## 2022-08-07 DIAGNOSIS — R197 Diarrhea, unspecified: Secondary | ICD-10-CM | POA: Diagnosis not present

## 2022-08-07 DIAGNOSIS — J189 Pneumonia, unspecified organism: Secondary | ICD-10-CM | POA: Diagnosis not present

## 2022-08-21 ENCOUNTER — Other Ambulatory Visit: Payer: Self-pay | Admitting: Pulmonary Disease

## 2022-08-21 DIAGNOSIS — J441 Chronic obstructive pulmonary disease with (acute) exacerbation: Secondary | ICD-10-CM

## 2022-09-03 DIAGNOSIS — J449 Chronic obstructive pulmonary disease, unspecified: Secondary | ICD-10-CM | POA: Diagnosis not present

## 2022-09-16 DIAGNOSIS — H2703 Aphakia, bilateral: Secondary | ICD-10-CM | POA: Diagnosis not present

## 2022-09-21 ENCOUNTER — Ambulatory Visit: Payer: PPO | Admitting: Podiatry

## 2022-09-21 DIAGNOSIS — M79675 Pain in left toe(s): Secondary | ICD-10-CM

## 2022-09-21 DIAGNOSIS — M79674 Pain in right toe(s): Secondary | ICD-10-CM | POA: Diagnosis not present

## 2022-09-21 DIAGNOSIS — L84 Corns and callosities: Secondary | ICD-10-CM

## 2022-09-21 DIAGNOSIS — B351 Tinea unguium: Secondary | ICD-10-CM | POA: Diagnosis not present

## 2022-09-21 DIAGNOSIS — Q828 Other specified congenital malformations of skin: Secondary | ICD-10-CM

## 2022-09-21 DIAGNOSIS — I739 Peripheral vascular disease, unspecified: Secondary | ICD-10-CM

## 2022-09-21 NOTE — Progress Notes (Signed)
  Subjective:  Patient ID: Joan Smith, female    DOB: 1941/01/14,  MRN: [id number]  Chief Complaint  Patient presents with   Nail Problem    Routine Foot Care     82 y.o. female presents with the above complaint. History confirmed with patient. Patient presenting with pain related to dystrophic thickened elongated nails. Patient is unable to trim own nails related to nail dystrophy and/or mobility issues. Patient does not have a history of T2DM.  Patient does have a history of peripheral arterial disease.  Patient does have callus present located at the bilateral plantar forefoot underlying the second metatarsal head causing pain.   Objective:  Physical Exam: warm, good capillary refill nail exam onychomycosis of the toenails, onycholysis, and dystrophic nails protective sensation intact, DP and PT pulses diminished bilaterally Left Foot:  Pain with palpation of nails due to elongation and dystrophic growth.  Hyperkeratotic lesion subsecond metatarsal head with pain on palpation Right Foot: Pain with palpation of nails due to elongation and dystrophic growth.  Hyperkeratotic lesion subsecond metatarsal head with pain on palpation  Assessment:   1. Pain due to onychomycosis of toenails of both feet   2. Porokeratosis   3. PAD (peripheral artery disease) (HCC)   4. Corns and callosities       Plan:  Patient was evaluated and treated and all questions answered.  #Hyperkeratotic lesions/pre ulcerative calluses present plantar aspect second metatarsal head bilateral foot All symptomatic hyperkeratoses x 2 separate lesions were safely debrided with a sterile #10 blade to patient's level of comfort without incident. We discussed preventative and palliative care of these lesions including supportive and accommodative shoegear, padding, prefabricated and custom molded accommodative orthoses, use of a pumice stone and lotions/creams daily.  #Onychomycosis with pain  -Nails palliatively  debrided as below. -Educated on self-care  Procedure: Nail Debridement Rationale: Pain Type of Debridement: manual, sharp debridement. Instrumentation: Nail nipper, rotary burr. Number of Nails: 10  Return in about 3 months (around 12/22/2022) for [facility].         Corinna Gab, DPM [facility]

## 2022-10-04 DIAGNOSIS — J449 Chronic obstructive pulmonary disease, unspecified: Secondary | ICD-10-CM | POA: Diagnosis not present

## 2022-10-08 ENCOUNTER — Telehealth (HOSPITAL_COMMUNITY): Payer: Self-pay | Admitting: *Deleted

## 2022-10-08 DIAGNOSIS — R001 Bradycardia, unspecified: Secondary | ICD-10-CM | POA: Diagnosis not present

## 2022-10-08 DIAGNOSIS — R059 Cough, unspecified: Secondary | ICD-10-CM | POA: Diagnosis not present

## 2022-10-08 DIAGNOSIS — R053 Chronic cough: Secondary | ICD-10-CM | POA: Diagnosis not present

## 2022-10-08 DIAGNOSIS — J449 Chronic obstructive pulmonary disease, unspecified: Secondary | ICD-10-CM | POA: Diagnosis not present

## 2022-10-08 NOTE — Telephone Encounter (Signed)
Pts son called stating pt was seen by pcp today and was told to call and get an urgent appt since her heart rate on her EKG was 32 they also held bisoprolol. He said ptis a symptomatic. I had her pcp fax her EKG to our office. Anna Genre reviewed it and said she had a lot of pvcs but does not believe her rate is that low pls continue bisoprolol. Pt Is scheduled with APP tomorrow. Son is aware and agreeable with plan.

## 2022-10-09 ENCOUNTER — Inpatient Hospital Stay (HOSPITAL_COMMUNITY)
Admission: RE | Admit: 2022-10-09 | Discharge: 2022-10-09 | Disposition: A | Discharge status: Home / Self Care | Payer: PPO | Source: Ambulatory Visit | Attending: Internal Medicine | Admitting: Internal Medicine

## 2022-10-09 ENCOUNTER — Encounter (HOSPITAL_COMMUNITY): Payer: Self-pay

## 2022-10-09 ENCOUNTER — Ambulatory Visit (HOSPITAL_COMMUNITY)
Admission: RE | Admit: 2022-10-09 | Discharge: 2022-10-09 | Disposition: A | Discharge status: Home / Self Care | Payer: PPO | Source: Ambulatory Visit | Attending: Family Medicine | Admitting: Family Medicine

## 2022-10-09 ENCOUNTER — Other Ambulatory Visit (HOSPITAL_COMMUNITY): Payer: Self-pay | Admitting: Internal Medicine

## 2022-10-09 VITALS — BP 140/74 | HR 54 | Wt 119.0 lb

## 2022-10-09 DIAGNOSIS — I110 Hypertensive heart disease with heart failure: Secondary | ICD-10-CM | POA: Insufficient documentation

## 2022-10-09 DIAGNOSIS — I493 Ventricular premature depolarization: Secondary | ICD-10-CM | POA: Diagnosis not present

## 2022-10-09 DIAGNOSIS — Z7982 Long term (current) use of aspirin: Secondary | ICD-10-CM | POA: Insufficient documentation

## 2022-10-09 DIAGNOSIS — R002 Palpitations: Secondary | ICD-10-CM | POA: Insufficient documentation

## 2022-10-09 DIAGNOSIS — J8410 Pulmonary fibrosis, unspecified: Secondary | ICD-10-CM | POA: Diagnosis not present

## 2022-10-09 DIAGNOSIS — R001 Bradycardia, unspecified: Secondary | ICD-10-CM

## 2022-10-09 DIAGNOSIS — I6529 Occlusion and stenosis of unspecified carotid artery: Secondary | ICD-10-CM | POA: Diagnosis not present

## 2022-10-09 DIAGNOSIS — R079 Chest pain, unspecified: Secondary | ICD-10-CM | POA: Diagnosis not present

## 2022-10-09 DIAGNOSIS — J449 Chronic obstructive pulmonary disease, unspecified: Secondary | ICD-10-CM | POA: Diagnosis not present

## 2022-10-09 DIAGNOSIS — Z87891 Personal history of nicotine dependence: Secondary | ICD-10-CM | POA: Insufficient documentation

## 2022-10-09 DIAGNOSIS — Z9981 Dependence on supplemental oxygen: Secondary | ICD-10-CM | POA: Diagnosis not present

## 2022-10-09 DIAGNOSIS — I6523 Occlusion and stenosis of bilateral carotid arteries: Secondary | ICD-10-CM

## 2022-10-09 DIAGNOSIS — G4733 Obstructive sleep apnea (adult) (pediatric): Secondary | ICD-10-CM | POA: Insufficient documentation

## 2022-10-09 DIAGNOSIS — I272 Pulmonary hypertension, unspecified: Secondary | ICD-10-CM | POA: Diagnosis not present

## 2022-10-09 DIAGNOSIS — I5022 Chronic systolic (congestive) heart failure: Secondary | ICD-10-CM | POA: Insufficient documentation

## 2022-10-09 DIAGNOSIS — I452 Bifascicular block: Secondary | ICD-10-CM

## 2022-10-09 DIAGNOSIS — J9611 Chronic respiratory failure with hypoxia: Secondary | ICD-10-CM | POA: Diagnosis not present

## 2022-10-09 DIAGNOSIS — J4489 Other specified chronic obstructive pulmonary disease: Secondary | ICD-10-CM | POA: Insufficient documentation

## 2022-10-09 DIAGNOSIS — Z79899 Other long term (current) drug therapy: Secondary | ICD-10-CM | POA: Diagnosis not present

## 2022-10-09 LAB — COMPREHENSIVE METABOLIC PANEL
ALT: 17 U/L (ref 0–44)
AST: 24 U/L (ref 15–41)
Albumin: 3.7 g/dL (ref 3.5–5.0)
Alkaline Phosphatase: 72 U/L (ref 38–126)
Anion gap: 8 (ref 5–15)
BUN: 30 mg/dL — ABNORMAL HIGH (ref 8–23)
CO2: 31 mmol/L (ref 22–32)
Calcium: 9.8 mg/dL (ref 8.9–10.3)
Chloride: 100 mmol/L (ref 98–111)
Creatinine, Ser: 1.15 mg/dL — ABNORMAL HIGH (ref 0.44–1.00)
GFR, Estimated: 47 mL/min — ABNORMAL LOW (ref >60)
Glucose, Bld: 92 mg/dL (ref 70–99)
Potassium: 5.1 mmol/L (ref 3.5–5.1)
Sodium: 139 mmol/L (ref 135–145)
Total Bilirubin: 0.8 mg/dL (ref 0.3–1.2)
Total Protein: 6.7 g/dL (ref 6.5–8.1)

## 2022-10-09 LAB — CBC
HCT: 44.9 % (ref 36.0–46.0)
Hemoglobin: 14 g/dL (ref 12.0–15.0)
MCH: 29.5 pg (ref 26.0–34.0)
MCHC: 31.2 g/dL (ref 30.0–36.0)
MCV: 94.7 fL (ref 80.0–100.0)
Platelets: 223 10*3/uL (ref 150–400)
RBC: 4.74 MIL/uL (ref 3.87–5.11)
RDW: 13.2 % (ref 11.5–15.5)
WBC: 9.4 10*3/uL (ref 4.0–10.5)
nRBC: 0 % (ref 0.0–0.2)

## 2022-10-09 LAB — T4, FREE: Free T4: 0.95 ng/dL (ref 0.61–1.12)

## 2022-10-09 LAB — TSH: TSH: 2.421 u[IU]/mL (ref 0.350–4.500)

## 2022-10-09 LAB — MAGNESIUM: Magnesium: 2.6 mg/dL — ABNORMAL HIGH (ref 1.7–2.4)

## 2022-10-09 NOTE — Progress Notes (Signed)
Advanced Heart Failure Clinic Note    PCP: Paulina Fusi, MD  Primary Cardiologist: Formerly Gunnar Fusi [facility] [place]) - relocated to [place] 07/2020 HF Cardiologist: Dr. Gala Romney  HPI: Ms. Beaufort is a 82 y.o. woman (family friend of Cristy Friedlander in cath lab) referred by Dr. 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   Her husband died in 2012/03/25. Shortly after that admitted with acute HF with 25 pounds of fluid.   Echo 2013 EF 35% mild to moderate TR. Cath 2013 no CAD Felt to be Tako-tsubo vs PVC-mediated  Holter 1/12 PVC burden 19.7% Holter 11/13 PVC burden 21% Holter  11/21 PVC burden 4% (no ablation or AAD)   Echo 2021 EF 55% moderate MR mild PAH G2DD  Previously managed at [facility] in [place] - relocated to [place] 07/2020 to live with her son.   Initial visit 10/22, NYHA II and volume stable. cMRI ordered to evaluate cause of CM.  Echo (10/22) EF 45-50% RV ok.  cMRI (1/23): difficult study, unable to calculate RVEF or LVEF;  - Noncoronary LGE pattern in the basal septum. The RV insertion sites with LGE can be nonspecific and suggest pressure/volume overload. However, the more extensive mid-wall basal septal LGE may be indicative of prior myocarditis.  Follow up 12/23 with Dr. Gala Romney, she fatigued easily, and was having stable exertional chest tightness she attributed to anxiety. She decided to fogo further testing and would notify clinic if it worsened.  Today she returns for an acute visit with her son, at request of her PCP. She was at her primary's office and her HR 32 a, asymptomatic. She was instructed to follow up with [place] and hold her bisoprolol. Overall feeling fine. She is here with her son. She continues to struggle with  fatigue. She uses oxygen at 2L night. Occasional positional dizziness, no falls. Occasional palpitations. Denies abnormal bleeding, CP,  edema, or PND/Orthopnea. Appetite ok. No fever or chills. Weight at home 117 pounds. Taking all medications. She has not needed Lasix. PCP follows labs.  ROS: All systems reviewed and negative except as per HPI.   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     azelastine (ASTELIN) 0.1 % nasal spray Place 2 sprays into the nose in the morning and at bedtime.     bisoprolol (ZEBETA) 10 MG tablet Take 1 tablet (10 mg total) by mouth daily. 90 tablet 2   BREZTRI AEROSPHERE 160-9-4.8 MCG/ACT AERO INHALE 2 PUFFS INTO THE LUNGS IN THE MORNING AND AT BEDTIME 10.7 g 3   cetirizine (ZYRTEC) 10 MG tablet Take 10 mg by mouth daily.  fluticasone (FLONASE) 50 MCG/ACT nasal spray Place 2 sprays into both nostrils daily.     levalbuterol (XOPENEX HFA) 45 MCG/ACT inhaler Inhale 2 puffs into the lungs as needed for wheezing or shortness of breath.     magnesium oxide (MAG-OX) 400 MG tablet Take 400 mg by mouth daily.     rosuvastatin (CRESTOR) 20 MG tablet Take 1 tablet by mouth at bedtime.     Spacer/Aero-Holding Rudean Curt Use with inhaler 1 each 2   spironolactone (ALDACTONE) 25 MG tablet Take 0.5 tablets (12.5 mg total) by mouth daily. 15 tablet 6   No current facility-administered  medications for this encounter.    Allergies  Allergen Reactions   Amoxicillin Diarrhea and Nausea And Vomiting   Ceclor [Cefaclor] Hives and Rash   Social History   Socioeconomic History   Marital status: Widowed    Spouse name: Not on file   Number of children: Not on file   Years of education: Not on file   Highest education level: Not on file  Occupational History   Not on file  Tobacco Use   Smoking status: Former    Packs/day: 1.50    Years: 50.00    Additional pack years: 0.00    Total pack years: 75.00    Types: Cigarettes    Quit date: 05/05/1999    Years since quitting: 23.4   Smokeless tobacco: Never  Substance and Sexual Activity   Alcohol use: Never   Drug use: Never   Sexual activity: Not on file  Other Topics Concern   Not on file  Social History Narrative   Not on file   Social Determinants of Health   Financial Resource Strain: Not on file  Food Insecurity: Not on file  Transportation Needs: Not on file  Physical Activity: Not on file  Stress: Not on file  Social Connections: Not on file  Intimate Partner Violence: Not on file   Family History  Problem Relation Age of Onset   COPD Mother    Diabetes Mellitus II Mother    CAD Mother    Heart failure Mother    CAD Father    Heart attack Father    CAD Sister    Cardiomyopathy Brother    BP (!) 140/74   Pulse (!) 54   Wt 54 kg (119 lb)   SpO2 97%   BMI 21.08 kg/m   Wt Readings from Last 3 Encounters:  10/09/22 54 kg (119 lb)  04/08/22 53.1 kg (117 lb)  12/11/21 53.6 kg (118 lb 3.2 oz)   PHYSICAL EXAM: General:  NAD. No resp difficulty, elderly HEENT: Normal Neck: Supple. No JVD. Carotids 2+ bilat; no bruits. No lymphadenopathy or thryomegaly appreciated. Cor: PMI nondisplaced. Brady regular rate & rhythm. No rubs, gallops or murmurs. Lungs: Clear Abdomen: Soft, nontender, nondistended. No hepatosplenomegaly. No bruits or masses. Good bowel sounds. Extremities: No cyanosis,  clubbing, rash, edema Neuro: Alert & oriented x 3, cranial nerves grossly intact. Moves all 4 extremities w/o difficulty. Affect pleasant.  ECG (personally reviewed): SB 52, RBBB   ASSESSMENT & PLAN: Bradycardia - HR at home 50s, asymptomatic - ? If PVC burden increasing causing erroneously low HR readings. - ECG showed SB today, no ectopy - Continue bisoprolol 10 mg daily for now. - Place Zio 2 week to quantify - Recent irons studies ok with PCP  - Check CMET, CBC, TSH and Mag  2. Chronic Systolic HF, with mildly reduced EF - Initial event certainly sounds  suspicious for TakoTsubo following the death of her husband in 2011/11/18, but subsequently may have had a component of PVC-related CM - Echo 2011-11-18 EF 35% mild to moderate TR.  - Cath 11/18/2011 no CAD Felt to be Tako-tsubo vs PVC-mediated - Holter 1/12 PVC burden 19.7% - Holter 11/13 PVC burden 21% - Holter 11/21 PVC burden 4% - Echo 18-Nov-2019 EF 55% moderate MR mild PAH G2DD - Echo 10/25/20 EF 45-50% RV ok - cMRI (1/23) unable to calculate LVEF or RVEF, extensive mid-wall basal septal LGE may be indicative of prior myocarditis. We discussed these results today. - Stable NYHA II. Volume status looks good. - Continue Lasix 20 mg PRN. Has not needed any recently. - Continue spiro 12.5 mg daily. - Continue bisoprolol 10 mg daily. - BP has not tolerated lisinopril. - No SGLT2i at this point with poor appetite and risk for volume depletion. Can reconsider as needed. - Update echo  3. Exertional chest tightness. - cath 11/18/2011 no CAD - CT 11-18-2019 minimal coronary calcifications - Dr. Gala Romney reviewed options of stress testing, cath or coronary CT - No further episodes.   4. Frequent PVCs - Holters as above. Burden much improved.  - As above, repeat Zio  5. Carotid artery stenosis - Nonobstructive. Asymptomatic - Continue ECASA/statin.  6. COPD with chronic hypoxic respiratory failure, on home O2 - Followed by Pulmonary (Dr. Tonia Brooms).  - No  wheezing on exam. 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  Follow up in 4 months with Dr. Gala Romney.  Jacklynn Ganong, FNP  12:17 PM

## 2022-10-09 NOTE — Patient Instructions (Addendum)
Thank you for coming in today  If you had labs drawn today, any labs that are abnormal the clinic will call you No news is good news  Medications: No change    Your provider has recommended that  you wear a Zio Patch for 14 days.  This monitor will record your heart rhythm for our review.  IF you have any symptoms while wearing the monitor please press the button.  If you have any issues with the patch or you notice a red or orange light on it please call the company at [phone].  Once you remove the patch please mail it back to the company as soon as possible so we can get the results.   Follow up appointments:  Your physician has requested that you have an echocardiogram. Echocardiography is a painless test that uses sound waves to create images of your heart. It provides your doctor with information about the size and shape of your heart and how well your heart's chambers and valves are working. This procedure takes approximately one hour. There are no restrictions for this procedure.     Your physician recommends that you schedule a follow-up appointment in:  3-4 months With Dr. Gala Romney  You will receive a reminder letter in the mail a few months in advance. If you don't receive a letter, please call our office to schedule the follow-up appointment.     Do the following things EVERYDAY: Weigh yourself in the morning before breakfast. Write it down and keep it in a log. Take your medicines as prescribed Eat low salt foods--Limit salt (sodium) to 2000 mg per day.  Stay as active as you can everyday Limit all fluids for the day to less than 2 liters   At the [facility], you and your health needs are our priority. As part of our continuing mission to provide you with exceptional heart care, we have created designated Provider Care Teams. These Care Teams include your primary Cardiologist (physician) and Advanced Practice Providers (APPs- Physician  Assistants and Nurse Practitioners) who all work together to provide you with the care you need, when you need it.   You may see any of the following providers on your designated Care Team at your next follow up: Dr Arvilla Meres Dr Marca Ancona Dr. Marcos Eke, NP Robbie Lis, [place] [facility] [place], [place] Brynda Peon, NP Karle Plumber, PharmD   Please be sure to bring in all your medications bottles to every appointment.    Thank you for choosing [facility]  If you have any questions or concerns before your next appointment please send [place] a message through [place] or call our office at [phone].    TO LEAVE A MESSAGE FOR THE NURSE SELECT OPTION 2, PLEASE LEAVE A MESSAGE INCLUDING: YOUR NAME DATE OF BIRTH CALL BACK NUMBER REASON FOR CALL**this is important as we prioritize the call backs  YOU WILL RECEIVE A CALL BACK THE SAME DAY AS LONG AS YOU CALL BEFORE 4:00 PM

## 2022-10-09 NOTE — Progress Notes (Signed)
Zio patch placed onto patient.  All instructions and information reviewed with patient, they verbalize understanding with no questions. 

## 2022-10-12 ENCOUNTER — Telehealth (HOSPITAL_COMMUNITY): Payer: Self-pay

## 2022-10-12 NOTE — Telephone Encounter (Addendum)
Pt aware, agreeable, and verbalized understanding  ----- Message from Jacklynn Ganong, FNP sent at 10/09/2022  4:18 PM EDT ----- Labs ok, SCr/BUN mildly elevated. Increase oral hydration

## 2022-10-19 ENCOUNTER — Ambulatory Visit: Payer: PPO | Admitting: Nurse Practitioner

## 2022-10-19 ENCOUNTER — Encounter: Payer: Self-pay | Admitting: Nurse Practitioner

## 2022-10-19 VITALS — BP 102/62 | HR 56 | Temp 98.4°F | Ht 63.0 in | Wt 119.6 lb

## 2022-10-19 DIAGNOSIS — J310 Chronic rhinitis: Secondary | ICD-10-CM

## 2022-10-19 DIAGNOSIS — I5042 Chronic combined systolic (congestive) and diastolic (congestive) heart failure: Secondary | ICD-10-CM | POA: Diagnosis not present

## 2022-10-19 DIAGNOSIS — R053 Chronic cough: Secondary | ICD-10-CM

## 2022-10-19 DIAGNOSIS — J9611 Chronic respiratory failure with hypoxia: Secondary | ICD-10-CM

## 2022-10-19 DIAGNOSIS — J4489 Other specified chronic obstructive pulmonary disease: Secondary | ICD-10-CM | POA: Diagnosis not present

## 2022-10-19 DIAGNOSIS — K219 Gastro-esophageal reflux disease without esophagitis: Secondary | ICD-10-CM

## 2022-10-19 DIAGNOSIS — J45901 Unspecified asthma with (acute) exacerbation: Secondary | ICD-10-CM | POA: Diagnosis not present

## 2022-10-19 DIAGNOSIS — J441 Chronic obstructive pulmonary disease with (acute) exacerbation: Secondary | ICD-10-CM

## 2022-10-19 MED ORDER — FAMOTIDINE 20 MG PO TABS
20.0000 mg | ORAL_TABLET | Freq: Two times a day (BID) | ORAL | 1 refills | Status: DC
Start: 2022-10-19 — End: 2022-11-30

## 2022-10-19 MED ORDER — PREDNISONE 20 MG PO TABS
20.0000 mg | ORAL_TABLET | Freq: Every day | ORAL | 0 refills | Status: AC
Start: 2022-10-19 — End: 2022-10-24

## 2022-10-19 MED ORDER — BENZONATATE 200 MG PO CAPS
200.0000 mg | ORAL_CAPSULE | Freq: Three times a day (TID) | ORAL | 1 refills | Status: DC | PRN
Start: 2022-10-19 — End: 2022-11-30

## 2022-10-19 MED ORDER — IPRATROPIUM BROMIDE 0.03 % NA SOLN
2.0000 | Freq: Three times a day (TID) | NASAL | 5 refills | Status: DC
Start: 2022-10-19 — End: 2022-11-30

## 2022-10-19 NOTE — Patient Instructions (Addendum)
-  Continue Breztri 2 puffs Twice daily, use with spacer. Brush tongue and rinse mouth afterwards  -Continue astelin nasal spray 2 sprays each nostril Twice daily  -Continue flonase nasal spray 2 sprays each nostril daily -Continue cetirizine daily  -Continue xopenex 1-2 puffs every 6 hours as needed for shortness of breath or wheezing -Continue supplemental oxygen 2 lpm at night.   -Prednisone 20 mg daily for 5 days. Take in AM with food -Benzonatate 1 capsule Three times a day as needed for cough -Ipratropium nasal spray 2 sprays each nostril 2-3 times a day -Famotidine 20 mg Twice daily for reflux. Use consistently over the next 6 weeks to see how your cough responds   Referral to ENT   Follow up in one month with Dr. Tonia Brooms or Philis Nettle. If symptoms do not improve or worsen, please contact office for sooner follow up or seek emergency care.

## 2022-10-19 NOTE — Progress Notes (Signed)
@Patient  ID: Joan Smith, female    DOB: [birth year], 82 y.o.   MRN: [id number]  Chief Complaint  Patient presents with   Acute Visit    Runny nose and coughing.  A lot of chest congestion and coughing up phlem.  Blowing out a lot of mucus from nose and sneezing from allergies.  Sx x 8 months.  Patient is wearing a heart monitor from Dr. Arvilla Meres, Heart Failure Unit at [facility].  Following for heart symptoms.    Referring provider: Paulina Fusi, MD  HPI: 82 year old female, former smoker (75 pack years) followed for COPD/asthma overlap. She is a patient of Dr. Myrlene Broker and last seen in office on 12/11/2021. Past medical history significant for SVT, CAD, HTN, PH, restrictive cardiomyopathy, CHF. She is followed by cardiology.   TEST/EVENTS:  10/25/2020 echocardiogram: EF 40 to 45%.  G1 DD.  Normal PASP.  LA severely dilated.  Trivial MVR.  01/20/2021: OV with Dr. Tonia Brooms. Stable, doing well. Recently tx for pna while on vacation. Continued on Trelegy daily and PRN xopenex.   06/09/2021: OV with Dvante Hands NP with son for reported cough and nasal drainage.  This has been ongoing for the last 2 months.  She reports her cough as primarily dry and states that her nose runs all the time. She has felt slightly more short of breath over the past few days. She occasionally has some watery eyes, but attributes this to her recent cataract surgery. She denies wheezing, orthopnea, PND, chest pain, hemoptysis, or lower extremity swelling. She continues on Trelegy daily and has not been using her rescue inhaler. She was recently switched from Zyrtec to [employer]. She was started on flonase nasal spray and continues on Astelin and has not noticed a huge difference.   10/19/2022: Today - acute Patient presents today for acute visit. She's had multiple health concerns over the past 8 months. She's being evaluated by cardiology and currently wearing a heart monitor due to DOE, heart palpitations, and chest  discomfort. She has also been struggling with a lot of nasal congestion, runny nose, cough and chest congestion. She describes the phlegm as white or yellow. She went and saw her PCP around 1.5-2 months ago with concern for pneumonia. They're not sure what antibiotic she received. She had a follow up on Friday and was told that the pneumonia was gone.  She tells me that she's feeling better since then. Her main concern is that she feels like she stops breathing at times. She just "loses her wind" but it's not always associated with activity. Her sinus symptoms have seemed better over the past few days but she still struggles with a runny nose and associated cough. Cough is congested but non-productive; no sputum production like she had with the pneumonia. She does seem to have some confusion regarding her history so her son helps provide this information. She denies any sore throat, headaches, fevers, chills, hemoptysis, leg swelling, orthopnea. She does have some occasional reflux, for which she uses Tums for but not on a daily basis. No difficulties swallowing. Eating and drinking normally.   Allergies  Allergen Reactions   Amoxicillin Diarrhea and Nausea And Vomiting   Ceclor [Cefaclor] Hives and Rash    Immunization History  Administered Date(s) Administered   Fluad Quad(high Dose 65+) 05/31/2020   Influenza, Seasonal, Injecte, Preservative Fre 02/14/2012   PFIZER(Purple Top)SARS-COV-2 Vaccination 07/30/2019, 08/22/2019    Past Medical History:  Diagnosis Date   Asthma  Broken heart syndrome 2013   Carotid arterial disease (HCC)    CHF (congestive heart failure) (HCC)    COPD (chronic obstructive pulmonary disease) (HCC)    Hypertension    OSA (obstructive sleep apnea) 2014   Paroxysmal SVT (supraventricular tachycardia)    Pulmonary fibrosis (HCC)    Pulmonary hypertension (HCC) 10/27/2019   PVC (premature ventricular contraction)    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     azelastine (ASTELIN) 0.1 % nasal spray Place 2 sprays into the nose in the morning and at bedtime.     bisoprolol (ZEBETA) 10 MG tablet Take 1 tablet (10 mg total) by mouth daily. 90 tablet 2   BREZTRI AEROSPHERE 160-9-4.8 MCG/ACT AERO INHALE 2 PUFFS INTO THE LUNGS IN THE MORNING AND AT BEDTIME 10.7 g 3   cetirizine (ZYRTEC) 10 MG tablet Take 10 mg by mouth daily.     fluticasone (FLONASE) 50 MCG/ACT nasal spray Place 2 sprays into both nostrils daily.     levalbuterol (XOPENEX HFA) 45 MCG/ACT inhaler Inhale 2 puffs into the lungs as needed for wheezing or shortness of breath.     magnesium oxide (MAG-OX) 400 MG tablet Take 400 mg by mouth daily.     rosuvastatin (CRESTOR) 20 MG tablet Take 1 tablet by mouth at bedtime.     Spacer/Aero-Holding Rudean Curt Use with inhaler 1 each 2   spironolactone (ALDACTONE) 25 MG tablet Take 0.5 tablets (12.5 mg total) by mouth daily. 15 tablet 6   No facility-administered medications prior to visit.     Review of Systems:   Constitutional: No weight loss or gain, night sweats, fevers, chills, fatigue, or lassitude. HEENT: No headaches, difficulty swallowing, tooth/dental problems, or sore throat. No sneezing, itching, ear ache. +nasal congestion, rhinorrhea, postnasal drip  CV:  +palpitations. No chest pain, orthopnea, PND, swelling in lower extremities, anasarca, dizziness,  syncope Resp: +shortness of breath with exertion; congested cough. No excess mucus or change in color of  mucus.  No hemoptysis. No wheezing.  No chest wall deformity GI:  No heartburn, indigestion, abdominal pain, nausea, vomiting, diarrhea, change in bowel habits, loss of appetite, bloody stools.  GU: No dysuria, change in color of urine, urgency or frequency.  Skin: No rash, lesions, ulcerations MSK:  No joint pain or swelling.  Neuro: No dizziness or lightheadedness.  Psych: No depression or anxiety. Mood stable.     Physical Exam:  BP 102/62 (BP Location: Right Arm, Patient Position: Sitting, Cuff Size: Normal)   Pulse (!) 56   Temp 98.4 F (36.9 C) (Oral)   Ht 5\' 3"  (1.6 m)   Wt 119 lb 9.6 oz (54.3 kg)   SpO2 94%   BMI 21.19 kg/m   GEN: Pleasant, interactive, well-nourished; in no acute distress HEENT:  Normocephalic and atraumatic. PERRLA. Sclera white. Nasal turbinates pale, moist and patent bilaterally. No rhinorrhea present. Oropharynx pink and moist, without exudate or edema. No lesions, ulcerations, or postnasal drip.  NECK:  Supple w/ fair ROM. No JVD present. Normal carotid impulses w/o bruits. Thyroid symmetrical with no goiter or nodules palpated. No lymphadenopathy.   CV: RRR, no m/r/g, no peripheral edema. Pulses intact, +2 bilaterally. No cyanosis, pallor or clubbing. PULMONARY:  Unlabored, regular breathing. Clear bilaterally A&P w/o wheezes/rales/rhonchi. Congested cough. No accessory muscle use. No dullness to percussion. GI: BS present and normoactive. Soft, non-tender to palpation. No organomegaly or masses detected.  MSK: No erythema, warmth or tenderness. Cap refil <2 sec all extrem. No deformities or joint swelling noted.  Neuro: A/Ox3. No focal deficits noted.   Skin: Warm, no lesions or rashe Psych: Normal affect and behavior. Judgement and thought content appropriate.     Lab Results:  CBC    Component Value Date/Time   WBC 9.4 10/09/2022 1255   RBC 4.74 10/09/2022 1255   HGB 14.0 10/09/2022 1255   HGB 14.2 05/21/2021 1015   HCT 44.9 10/09/2022  1255   HCT 43.2 05/21/2021 1015   PLT 223 10/09/2022 1255   PLT 239 05/21/2021 1015   MCV 94.7 10/09/2022 1255   MCV 92 05/21/2021 1015   MCH 29.5 10/09/2022 1255   MCHC 31.2 10/09/2022 1255   RDW 13.2 10/09/2022 1255   RDW 12.9 05/21/2021 1015   LYMPHSABS 2.0 11/05/2020 1330   EOSABS 0.1 11/05/2020 1330   BASOSABS 0.0 11/05/2020 1330    BMET    Component Value Date/Time   NA 139 10/09/2022 1255   NA 141 11/25/2020 1421   K 5.1 10/09/2022 1255   CL 100 10/09/2022 1255   CO2 31 10/09/2022 1255   GLUCOSE 92 10/09/2022 1255   BUN 30 (H) 10/09/2022 1255   BUN 24 11/25/2020 1421   CREATININE 1.15 (H) 10/09/2022 1255   CALCIUM 9.8 10/09/2022 1255   GFRNONAA 47 (L) 10/09/2022 1255    BNP No results found for: "BNP"   Imaging:  No results found.        No data to display          No results found for: "NITRICOXIDE"      Assessment & Plan:   Asthma with COPD with exacerbation (HCC) Unclear if this is a slow to resolve exacerbation following her pneumonia or more so upper airway syndrome related to persistent sinus symptoms. Likely combination. She was unable to complete FeNO today. Hold off on further imaging - will request records from her PCP. Hold off on further abx therapy given improvement in infectious symptoms and recent clearing on imaging. Non-toxic and VS stable. We will challenge her with steroid burst and cough control measures. Target GERD and sinus symptoms to limit further upper airway irritation. Action plan in place.  Patient Instructions  -Continue Breztri 2 puffs Twice daily, use with spacer. Brush tongue and rinse mouth afterwards  -Continue astelin nasal spray 2 sprays each nostril Twice daily  -Continue flonase nasal spray 2 sprays each nostril daily -Continue cetirizine daily  -Continue xopenex 1-2 puffs every 6 hours as needed for shortness of breath or wheezing -Continue supplemental oxygen 2 lpm at night.   -Prednisone 20 mg daily  for 5 days. Take in AM with food -Benzonatate 1 capsule Three times a day as needed for cough -Ipratropium nasal spray 2 sprays each nostril 2-3 times a day -Famotidine 20 mg Twice daily for reflux. Use consistently over the next 6 weeks to see how your cough responds   Referral to ENT   Follow up in one month with Dr. Tonia Brooms or  Joan Ayaan Shutes,NP. If symptoms do not improve or worsen, please contact office for sooner follow up or seek emergency care.   Chronic respiratory failure with hypoxia (HCC) Stable without increased O2 requirement  Chronic rhinitis See above  CHF (congestive heart failure) (HCC) Euvolemic on exam. Follow up with cardiology as scheduled     Noemi Chapel, NP 10/23/2022  Pt aware and understands NP's role.

## 2022-10-22 NOTE — Assessment & Plan Note (Signed)
Stable without increased O2 requirement

## 2022-10-22 NOTE — Assessment & Plan Note (Signed)
Unclear if this is a slow to resolve exacerbation following her pneumonia or more so upper airway syndrome related to persistent sinus symptoms. Likely combination. She was unable to complete FeNO today. Hold off on further imaging - will request records from her PCP. Hold off on further abx therapy given improvement in infectious symptoms and recent clearing on imaging. Non-toxic and VS stable. We will challenge her with steroid burst and cough control measures. Target GERD and sinus symptoms to limit further upper airway irritation. Action plan in place.  Patient Instructions  -Continue Breztri 2 puffs Twice daily, use with spacer. Brush tongue and rinse mouth afterwards  -Continue astelin nasal spray 2 sprays each nostril Twice daily  -Continue flonase nasal spray 2 sprays each nostril daily -Continue cetirizine daily  -Continue xopenex 1-2 puffs every 6 hours as needed for shortness of breath or wheezing -Continue supplemental oxygen 2 lpm at night.   -Prednisone 20 mg daily for 5 days. Take in AM with food -Benzonatate 1 capsule Three times a day as needed for cough -Ipratropium nasal spray 2 sprays each nostril 2-3 times a day -Famotidine 20 mg Twice daily for reflux. Use consistently over the next 6 weeks to see how your cough responds   Referral to ENT   Follow up in one month with Dr. Tonia Brooms or Philis Nettle. If symptoms do not improve or worsen, please contact office for sooner follow up or seek emergency care.

## 2022-10-23 ENCOUNTER — Encounter: Payer: Self-pay | Admitting: Nurse Practitioner

## 2022-10-23 NOTE — Assessment & Plan Note (Signed)
Euvolemic on exam. Follow up with cardiology as scheduled 

## 2022-10-23 NOTE — Assessment & Plan Note (Signed)
See above

## 2022-10-30 DIAGNOSIS — R001 Bradycardia, unspecified: Secondary | ICD-10-CM | POA: Diagnosis not present

## 2022-10-30 NOTE — Addendum Note (Signed)
Encounter addended by: Crissie Figures, RN on: 10/30/2022 4:19 PM  Actions taken: Imaging Exam ended

## 2022-11-03 ENCOUNTER — Other Ambulatory Visit: Payer: Self-pay | Admitting: Internal Medicine

## 2022-11-03 DIAGNOSIS — J449 Chronic obstructive pulmonary disease, unspecified: Secondary | ICD-10-CM | POA: Diagnosis not present

## 2022-11-03 MED ORDER — BISOPROLOL FUMARATE 10 MG PO TABS: 10.0000 mg | ORAL_TABLET | Freq: Every day | ORAL | 2 refills | Status: DC

## 2022-11-11 DIAGNOSIS — E785 Hyperlipidemia, unspecified: Secondary | ICD-10-CM | POA: Diagnosis not present

## 2022-11-11 DIAGNOSIS — I10 Essential (primary) hypertension: Secondary | ICD-10-CM | POA: Diagnosis not present

## 2022-11-11 DIAGNOSIS — M8589 Other specified disorders of bone density and structure, multiple sites: Secondary | ICD-10-CM | POA: Diagnosis not present

## 2022-11-11 DIAGNOSIS — I5022 Chronic systolic (congestive) heart failure: Secondary | ICD-10-CM | POA: Diagnosis not present

## 2022-11-11 DIAGNOSIS — J449 Chronic obstructive pulmonary disease, unspecified: Secondary | ICD-10-CM | POA: Diagnosis not present

## 2022-11-30 ENCOUNTER — Encounter (HOSPITAL_BASED_OUTPATIENT_CLINIC_OR_DEPARTMENT_OTHER): Payer: Self-pay | Admitting: Nurse Practitioner

## 2022-11-30 ENCOUNTER — Ambulatory Visit (HOSPITAL_BASED_OUTPATIENT_CLINIC_OR_DEPARTMENT_OTHER): Payer: PPO | Admitting: Nurse Practitioner

## 2022-11-30 VITALS — BP 118/46 | HR 47 | Ht 63.0 in | Wt 123.4 lb

## 2022-11-30 DIAGNOSIS — R053 Chronic cough: Secondary | ICD-10-CM

## 2022-11-30 DIAGNOSIS — J4489 Other specified chronic obstructive pulmonary disease: Secondary | ICD-10-CM

## 2022-11-30 DIAGNOSIS — K219 Gastro-esophageal reflux disease without esophagitis: Secondary | ICD-10-CM | POA: Diagnosis not present

## 2022-11-30 DIAGNOSIS — J9611 Chronic respiratory failure with hypoxia: Secondary | ICD-10-CM

## 2022-11-30 DIAGNOSIS — J310 Chronic rhinitis: Secondary | ICD-10-CM

## 2022-11-30 MED ORDER — BENZONATATE 200 MG PO CAPS
200.0000 mg | ORAL_CAPSULE | Freq: Three times a day (TID) | ORAL | 2 refills | Status: DC | PRN
Start: 2022-11-30 — End: 2023-11-22

## 2022-11-30 MED ORDER — FAMOTIDINE 20 MG PO TABS: 20.0000 mg | ORAL_TABLET | Freq: Two times a day (BID) | ORAL | 5 refills | Status: DC

## 2022-11-30 MED ORDER — IPRATROPIUM BROMIDE 0.03 % NA SOLN
2.0000 | Freq: Three times a day (TID) | NASAL | 5 refills | Status: DC
Start: 2022-11-30 — End: 2023-11-30

## 2022-11-30 NOTE — Assessment & Plan Note (Signed)
Clinically improved. Compensated on current regimen. Action plan in place. Encouraged to remain active.   Patient Instructions  -Continue Breztri 2 puffs Twice daily, use with spacer. Brush tongue and rinse mouth afterwards  -Continue cetirizine daily  -Continue xopenex 1-2 puffs every 6 hours as needed for shortness of breath or wheezing -Continue supplemental oxygen 2 lpm at night. -Continue Benzonatate 1 capsule Three times a day as needed for cough -Continue Ipratropium nasal spray 2 sprays each nostril 2-3 times a day -Continue Famotidine 20 mg Twice daily for reflux  Ok to back off on the flonase and astelin until allergy season. Would recommend you restart one or the other about 2 weeks before fall allergy season    Follow up in four months with Dr. Tonia Brooms or Florentina Addison Ameliarose Shark,NP. If symptoms do not improve or worsen, please contact office for sooner follow up or seek emergency care.

## 2022-11-30 NOTE — Assessment & Plan Note (Signed)
Significant improvement with intranasal ipratropium. Cough has improved with treatment of sinus symptoms. She will utilize intranasal steroid/antihistamine as needed during allergy season

## 2022-11-30 NOTE — Progress Notes (Signed)
@Patient  ID: Joan Smith, female    DOB: August 29, 1940, 82 y.o.   MRN: [id number]  Chief Complaint  Patient presents with   Follow-up    She has gotten better since  visit, medications helps     Referring provider: Paulina Fusi, MD  HPI: 82 year old female, former smoker (75 pack years) followed for COPD/asthma overlap. She is a patient of Dr. Myrlene Broker and last seen in office on 10/19/2022 by [facility] NP. Past medical history significant for SVT, CAD, HTN, PH, restrictive cardiomyopathy, CHF. She is followed by cardiology.   TEST/EVENTS:  10/25/2020 echocardiogram: EF 40 to 45%.  G1 DD.  Normal PASP.  LA severely dilated.  Trivial MVR.  01/20/2021: OV with Dr. Tonia Brooms. Stable, doing well. Recently tx for pna while on vacation. Continued on Trelegy daily and PRN xopenex.   06/09/2021: OV with Pharrell Ledford NP with son for reported cough and nasal drainage.  This has been ongoing for the last 2 months.  She reports her cough as primarily dry and states that her nose runs all the time. She has felt slightly more short of breath over the past few days. She occasionally has some watery eyes, but attributes this to her recent cataract surgery. She denies wheezing, orthopnea, PND, chest pain, hemoptysis, or lower extremity swelling. She continues on Trelegy daily and has not been using her rescue inhaler. She was recently switched from Zyrtec to [employer]. She was started on flonase nasal spray and continues on Astelin and has not noticed a huge difference.   10/19/2022: OV with Malyk Girouard NP for acute visit. She's had multiple health concerns over the past 8 months. She's being evaluated by cardiology and currently wearing a heart monitor due to DOE, heart palpitations, and chest discomfort. She has also been struggling with a lot of nasal congestion, runny nose, cough and chest congestion. She describes the phlegm as white or yellow. She went and saw her PCP around 1.5-2 months ago with concern for pneumonia. They're  not sure what antibiotic she received. She had a follow up on Friday and was told that the pneumonia was gone.  She tells me that she's feeling better since then. Her main concern is that she feels like she stops breathing at times. She just "loses her wind" but it's not always associated with activity. Her sinus symptoms have seemed better over the past few days but she still struggles with a runny nose and associated cough. Cough is congested but non-productive; no sputum production like she had with the pneumonia. She does seem to have some confusion regarding her history so her son helps provide this information. She denies any sore throat, headaches, fevers, chills, hemoptysis, leg swelling, orthopnea. She does have some occasional reflux, for which she uses Tums for but not on a daily basis. No difficulties swallowing. Eating and drinking normally.   11/30/2022: Today - follow up Patient presents today for follow up with her son. She is feeling much better after her last visit. Her cough is gone for the most part. She does have some days when she has a little bit of a dry cough, which she will use the tessalon for and it goes away. She feels like the ipratropium nasal spray works well for her sinuses. She's not really noticing much drainage or congestion. She's not using the flonase or astelin daily anymore. Her reflux symptoms are resolved with the pepcid we started. Overall, she is very happy with how she is feeling today.  She has no concerns or complaints.   Allergies  Allergen Reactions   Amoxicillin Diarrhea and Nausea And Vomiting   Ceclor [Cefaclor] Hives and Rash    Immunization History  Administered Date(s) Administered   Fluad Quad(high Dose 65+) 05/31/2020   Influenza, Seasonal, Injecte, Preservative Fre 02/14/2012   PFIZER(Purple Top)SARS-COV-2 Vaccination 07/30/2019, 08/22/2019    Past Medical History:  Diagnosis Date   Asthma    Broken heart syndrome 2013   Carotid arterial  disease (HCC)    CHF (congestive heart failure) (HCC)    COPD (chronic obstructive pulmonary disease) (HCC)    Hypertension    OSA (obstructive sleep apnea) 2014   Paroxysmal SVT (supraventricular tachycardia)    Pulmonary fibrosis (HCC)    Pulmonary hypertension (HCC) 10/27/2019   PVC (premature ventricular contraction)    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     azelastine (ASTELIN) 0.1 % nasal spray Place 2 sprays into the nose in the morning and at bedtime.     bisoprolol (ZEBETA) 10 MG tablet Take 1 tablet (10 mg total) by mouth daily. 90 tablet 2   BREZTRI AEROSPHERE 160-9-4.8 MCG/ACT AERO INHALE 2 PUFFS INTO THE LUNGS IN THE MORNING AND AT BEDTIME 10.7 g 3   cetirizine (ZYRTEC) 10 MG tablet Take 10 mg by mouth daily.     fluticasone (FLONASE) 50 MCG/ACT nasal spray Place 2 sprays into both nostrils daily.     levalbuterol (XOPENEX HFA) 45 MCG/ACT inhaler Inhale 2 puffs into the lungs as needed for wheezing or shortness of breath.     magnesium oxide (MAG-OX) 400 MG tablet Take 400 mg by mouth daily.     rosuvastatin (CRESTOR) 20 MG tablet Take 1 tablet by mouth at bedtime.     Spacer/Aero-Holding Rudean Curt Use with inhaler 1 each 2   spironolactone (ALDACTONE) 25 MG tablet TAKE 1/2 TABLET(12.5 MG) BY MOUTH DAILY 15 tablet 6   benzonatate (TESSALON) 200 MG capsule Take 1 capsule (200 mg total) by mouth 3 (three) times daily as needed for cough. 30 capsule 1   famotidine (PEPCID)  20 MG tablet Take 1 tablet (20 mg total) by mouth 2 (two) times daily. 60 tablet 1   ipratropium (ATROVENT) 0.03 % nasal spray Place 2 sprays into both nostrils 3 (three) times daily. 30 mL 5   No facility-administered medications prior to visit.     Review of Systems:   Constitutional: No weight loss or gain, night sweats, fevers, chills, fatigue, or lassitude. HEENT: No headaches, difficulty swallowing, tooth/dental problems, or sore throat. No sneezing, itching, ear ache. +nasal congestion, rhinorrhea, postnasal drip (improved) CV:   No chest pain, orthopnea, PND, palpitations, swelling in lower extremities, anasarca, dizziness,  syncope Resp: +shortness of breath with exertion (baseline); minimal cough (improved). No excess mucus or change in color of mucus.  No hemoptysis. No wheezing.  No chest wall deformity GI:  No heartburn, indigestion, abdominal pain, nausea, vomiting, diarrhea, change in bowel habits,  loss of appetite, bloody stools.  GU: No dysuria, change in color of urine, urgency or frequency.  Skin: No rash, lesions, ulcerations MSK:  No joint pain or swelling.  Neuro: No dizziness or lightheadedness.  Psych: No depression or anxiety. Mood stable.     Physical Exam:  BP (!) 118/46   Pulse (!) 47   Ht 5\' 3"  (1.6 m)   Wt 123 lb 6.4 oz (56 kg)   SpO2 94%   BMI 21.86 kg/m   GEN: Pleasant, interactive, well-nourished; in no acute distress HEENT:  Normocephalic and atraumatic. PERRLA. Sclera white. Nasal turbinates pale, moist and patent bilaterally. No rhinorrhea present. Oropharynx pink and moist, without exudate or edema. No lesions, ulcerations, or postnasal drip.  NECK:  Supple w/ fair ROM. No JVD present. Normal carotid impulses w/o bruits. Thyroid symmetrical with no goiter or nodules palpated. No lymphadenopathy.   CV: RRR, no m/r/g, no peripheral edema. Pulses intact, +2 bilaterally. No cyanosis, pallor or clubbing. PULMONARY:  Unlabored, regular breathing.  Clear bilaterally A&P w/o wheezes/rales/rhonchi. No accessory muscle use. No dullness to percussion. GI: BS present and normoactive. Soft, non-tender to palpation. No organomegaly or masses detected.  MSK: No erythema, warmth or tenderness. Cap refil <2 sec all extrem. No deformities or joint swelling noted.  Neuro: A/Ox3. No focal deficits noted.   Skin: Warm, no lesions or rashe Psych: Normal affect and behavior. Judgement and thought content appropriate.     Lab Results:  CBC    Component Value Date/Time   WBC 9.4 10/09/2022 1255   RBC 4.74 10/09/2022 1255   HGB 14.0 10/09/2022 1255   HGB 14.2 05/21/2021 1015   HCT 44.9 10/09/2022 1255   HCT 43.2 05/21/2021 1015   PLT 223 10/09/2022 1255   PLT 239 05/21/2021 1015   MCV 94.7 10/09/2022 1255   MCV 92 05/21/2021 1015   MCH 29.5 10/09/2022 1255   MCHC 31.2 10/09/2022 1255   RDW 13.2 10/09/2022 1255   RDW 12.9 05/21/2021 1015   LYMPHSABS 2.0 11/05/2020 1330   EOSABS 0.1 11/05/2020 1330   BASOSABS 0.0 11/05/2020 1330    BMET    Component Value Date/Time   NA 139 10/09/2022 1255   NA 141 11/25/2020 1421   K 5.1 10/09/2022 1255   CL 100 10/09/2022 1255   CO2 31 10/09/2022 1255   GLUCOSE 92 10/09/2022 1255   BUN 30 (H) 10/09/2022 1255   BUN 24 11/25/2020 1421   CREATININE 1.15 (H) 10/09/2022 1255   CALCIUM 9.8 10/09/2022 1255   GFRNONAA 47 (L) 10/09/2022 1255    BNP No results found for: "BNP"   Imaging:  No results found.  Administration History     None           No data to display          No results found for: "NITRICOXIDE"      Assessment & Plan:   Asthma with COPD Clinically improved. Compensated on current regimen. Action plan in place. Encouraged to remain active.   Patient Instructions  -Continue Breztri 2 puffs Twice daily, use with spacer. Brush tongue and rinse mouth afterwards  -Continue cetirizine daily  -Continue xopenex 1-2 puffs every 6 hours as needed for shortness of  breath or wheezing -Continue supplemental oxygen 2 lpm at night. -Continue Benzonatate 1 capsule Three times a day as needed for cough -Continue Ipratropium nasal spray 2 sprays each nostril 2-3 times a day -Continue Famotidine 20 mg Twice daily for reflux  Ok to back off on the flonase and astelin until allergy season. Would recommend you restart one or the other about 2 weeks before fall allergy season    Follow up in four months with Dr. Tonia Brooms or Florentina Addison Tyrique Sporn,NP. If symptoms do not improve or worsen, please contact office for sooner follow up or seek emergency care.   Chronic rhinitis Significant improvement with intranasal ipratropium. Cough has improved with treatment of sinus symptoms. She will utilize intranasal steroid/antihistamine as needed during allergy season   Chronic respiratory failure with hypoxia (HCC) Stable without increased O2 requirement. Goal >88-90%    Noemi Chapel, NP 11/30/2022  Pt aware and understands NP's role.

## 2022-11-30 NOTE — Patient Instructions (Signed)
-  Continue Breztri 2 puffs Twice daily, use with spacer. Brush tongue and rinse mouth afterwards  -Continue cetirizine daily  -Continue xopenex 1-2 puffs every 6 hours as needed for shortness of breath or wheezing -Continue supplemental oxygen 2 lpm at night. -Continue Benzonatate 1 capsule Three times a day as needed for cough -Continue Ipratropium nasal spray 2 sprays each nostril 2-3 times a day -Continue Famotidine 20 mg Twice daily for reflux  Ok to back off on the flonase and astelin until allergy season. Would recommend you restart one or the other about 2 weeks before fall allergy season    Follow up in four months with Joan Smith or Joan Addison Lashante Fryberger,NP. If symptoms do not improve or worsen, please contact office for sooner follow up or seek emergency care.

## 2022-11-30 NOTE — Assessment & Plan Note (Signed)
Stable without increased O2 requirement. Goal >88-90% 

## 2022-12-04 DIAGNOSIS — J449 Chronic obstructive pulmonary disease, unspecified: Secondary | ICD-10-CM | POA: Diagnosis not present

## 2022-12-22 ENCOUNTER — Other Ambulatory Visit: Payer: Self-pay | Admitting: Pulmonary Disease

## 2022-12-22 ENCOUNTER — Ambulatory Visit: Payer: PPO | Admitting: Podiatry

## 2022-12-22 DIAGNOSIS — J441 Chronic obstructive pulmonary disease with (acute) exacerbation: Secondary | ICD-10-CM

## 2023-01-04 DIAGNOSIS — J449 Chronic obstructive pulmonary disease, unspecified: Secondary | ICD-10-CM | POA: Diagnosis not present

## 2023-01-30 DIAGNOSIS — R319 Hematuria, unspecified: Secondary | ICD-10-CM | POA: Diagnosis not present

## 2023-01-30 DIAGNOSIS — I6782 Cerebral ischemia: Secondary | ICD-10-CM | POA: Diagnosis not present

## 2023-01-30 DIAGNOSIS — R410 Disorientation, unspecified: Secondary | ICD-10-CM | POA: Diagnosis not present

## 2023-01-30 DIAGNOSIS — E041 Nontoxic single thyroid nodule: Secondary | ICD-10-CM | POA: Diagnosis not present

## 2023-01-30 DIAGNOSIS — J449 Chronic obstructive pulmonary disease, unspecified: Secondary | ICD-10-CM | POA: Diagnosis not present

## 2023-01-30 DIAGNOSIS — R9431 Abnormal electrocardiogram [ECG] [EKG]: Secondary | ICD-10-CM | POA: Diagnosis not present

## 2023-01-30 DIAGNOSIS — R4182 Altered mental status, unspecified: Secondary | ICD-10-CM | POA: Diagnosis not present

## 2023-01-30 DIAGNOSIS — I6523 Occlusion and stenosis of bilateral carotid arteries: Secondary | ICD-10-CM | POA: Diagnosis not present

## 2023-01-30 DIAGNOSIS — J811 Chronic pulmonary edema: Secondary | ICD-10-CM | POA: Diagnosis not present

## 2023-01-30 DIAGNOSIS — I509 Heart failure, unspecified: Secondary | ICD-10-CM | POA: Diagnosis not present

## 2023-01-30 DIAGNOSIS — R918 Other nonspecific abnormal finding of lung field: Secondary | ICD-10-CM | POA: Diagnosis not present

## 2023-01-30 DIAGNOSIS — I700 Atherosclerosis of aorta: Secondary | ICD-10-CM | POA: Diagnosis not present

## 2023-01-30 DIAGNOSIS — R0989 Other specified symptoms and signs involving the circulatory and respiratory systems: Secondary | ICD-10-CM | POA: Diagnosis not present

## 2023-02-02 ENCOUNTER — Telehealth (HOSPITAL_COMMUNITY): Payer: Self-pay | Admitting: Cardiology

## 2023-02-02 MED ORDER — SPIRONOLACTONE 25 MG PO TABS: 12.5000 mg | ORAL_TABLET | Freq: Every day | ORAL | 1 refills | Status: DC

## 2023-02-02 MED ORDER — FUROSEMIDE 20 MG PO TABS: 20.0000 mg | ORAL_TABLET | ORAL | 1 refills | Status: AC | PRN

## 2023-02-02 NOTE — Telephone Encounter (Signed)
Son called to report pt was seen in ER for increase in fluid. Was advised to take lasix prn and need refills  Er fu 10/14

## 2023-02-03 DIAGNOSIS — J449 Chronic obstructive pulmonary disease, unspecified: Secondary | ICD-10-CM | POA: Diagnosis not present

## 2023-02-12 DIAGNOSIS — I5022 Chronic systolic (congestive) heart failure: Secondary | ICD-10-CM | POA: Diagnosis not present

## 2023-02-12 DIAGNOSIS — R410 Disorientation, unspecified: Secondary | ICD-10-CM | POA: Diagnosis not present

## 2023-02-12 DIAGNOSIS — F0390 Unspecified dementia without behavioral disturbance: Secondary | ICD-10-CM | POA: Diagnosis not present

## 2023-02-12 NOTE — Progress Notes (Signed)
Advanced Heart Failure Clinic Note    PCP: Paulina Fusi, MD  Primary Cardiologist: Formerly Gunnar Fusi [facility] [place]) - relocated to [place] 07/2020 HF Cardiologist: Dr. Gala Romney  HPI: Ms. Bothun is a 82 y.o. woman (family friend of Cristy Friedlander in cath lab) referred by Dr. 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   Her husband died in 03/10/12. Shortly after that admitted with acute HF with 25 pounds of fluid.   Echo 03/10/2012 EF 35% mild to moderate TR. Cath 03/10/2012 no CAD Felt to be Tako-tsubo vs PVC-mediated  Holter 1/12 PVC burden 19.7% Holter 11/13 PVC burden 21% Holter 11/21 PVC burden 4% (no ablation or AAD)   Echo 2020-03-10 EF 55% moderate MR mild PAH G2DD  Previously managed at [facility] in [place] - relocated to [place] 07/2020 to live with her son.   Initial visit 10/22, NYHA II and volume stable. cMRI ordered to evaluate cause of CM.  Echo (10/22) EF 45-50% RV ok.  cMRI (1/23): difficult study, unable to calculate RVEF or LVEF;  - Noncoronary LGE pattern in the basal septum. The RV insertion sites with LGE can be nonspecific and suggest pressure/volume overload. However, the more extensive mid-wall basal septal LGE may be indicative of prior myocarditis.  Follow up 12/23 with Dr. Gala Romney, she fatigued easily, and was having stable exertional chest tightness she attributed to anxiety. She decided to fogo further testing and would notify clinic if it worsened.  Follow up 6/24 acute visit for bradycardia. HR 32, told to hold beta blocker by PCP. ECG showed frequent PVCs.    Zio- 2 week (6/24): mostly NSR, 11 runs of NSVT, 54 runs of SVT, 8.9% PVC burden  Seen in [place] ED 01/30/23 with hallucinations, +/- sepsis. CT head showed old small  aneurysm, nothing acute. ? HF exacerbation and advised to take Lasix, she only took 1 dose. No records to review.  Today she returns for post ED evaluation follow up with her son. Overall feeling fine, remains fatigued. Main complaint is insomnia. She has mild SOB with ADLs, she attributes this to anxiety. She has dizziness when she bends over, no falls. Feels heart skip a beat occasionally. Denies CP, edema, or PND/Orthopnea. Appetite fair. No fever or chills. Weight at home 127 pounds. Taking all medications. Wears 2L oxygen at night.   ROS: All systems reviewed and negative except as per HPI.   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     azelastine (ASTELIN) 0.1 % nasal spray Place 2 sprays into the nose in the morning and at bedtime.     benzonatate (TESSALON) 200 MG capsule Take  1 capsule (200 mg total) by mouth 3 (three) times daily as needed for cough. 30 capsule 2   bisoprolol (ZEBETA) 10 MG tablet Take 1 tablet (10 mg total) by mouth daily. 90 tablet 2   BREZTRI AEROSPHERE 160-9-4.8 MCG/ACT AERO INHALE 2 PUFFS INTO THE LUNGS IN THE MORNING AND AT BEDTIME 10.7 g 3   cetirizine (ZYRTEC) 10 MG tablet Take 10 mg by mouth daily.     famotidine (PEPCID) 20 MG tablet Take 1 tablet (20 mg total) by mouth 2 (two) times daily. 60 tablet 5   fluticasone (FLONASE) 50 MCG/ACT nasal spray Place 2 sprays into  both nostrils daily.     ipratropium (ATROVENT) 0.03 % nasal spray Place 2 sprays into both nostrils 3 (three) times daily. 30 mL 5   levalbuterol (XOPENEX HFA) 45 MCG/ACT inhaler Inhale 2 puffs into the lungs as needed for wheezing or shortness of breath.     magnesium oxide (MAG-OX) 400 MG tablet Take 400 mg by mouth daily.     rosuvastatin (CRESTOR) 20 MG tablet Take 1 tablet by mouth at bedtime.     Spacer/Aero-Holding Rudean Curt Use with inhaler 1 each 2   spironolactone (ALDACTONE) 25 MG tablet Take 0.5 tablets (12.5 mg total) by mouth daily. 15 tablet 1   furosemide (LASIX) 20 MG tablet Take 1 tablet (20 mg total) by mouth as needed. (Patient not taking: Reported on 02/15/2023) 30 tablet 1   No current facility-administered medications for this encounter.   Allergies  Allergen Reactions   Amoxicillin Diarrhea and Nausea And Vomiting   Ceclor [Cefaclor] Hives and Rash   Social History   Socioeconomic History   Marital status: Widowed    Spouse name: Not on file   Number of children: Not on file   Years of education: Not on file   Highest education level: Not on file  Occupational History   Not on file  Tobacco Use   Smoking status: Former    Current packs/day: 0.00    Average packs/day: 1.5 packs/day for 50.0 years (75.0 ttl pk-yrs)    Types: Cigarettes    Start date: 05/04/1949    Quit date: 05/05/1999    Years since quitting: 23.8   Smokeless tobacco: Never  Substance and Sexual Activity   Alcohol use: Never   Drug use: Never   Sexual activity: Not on file  Other Topics Concern   Not on file  Social History Narrative   Not on file   Social Determinants of Health   Financial Resource Strain: Not on file  Food Insecurity: Not on file  Transportation Needs: Not on file  Physical Activity: Not on file  Stress: Not on file  Social Connections: Not on file  Intimate Partner Violence: Not on file   Family History  Problem Relation Age of Onset   COPD Mother     Diabetes Mellitus II Mother    CAD Mother    Heart failure Mother    CAD Father    Heart attack Father    CAD Sister    Cardiomyopathy Brother    BP 120/70   Pulse 60   Ht 5\' 3"  (1.6 m)   Wt 58.3 kg (128 lb 9.6 oz)   SpO2 95%   BMI 22.78 kg/m   Wt Readings from Last 3 Encounters:  02/15/23 58.3 kg (128 lb 9.6 oz)  11/30/22 56 kg (123 lb 6.4 oz)  10/19/22 54.3 kg (119 lb 9.6 oz)   PHYSICAL EXAM:  General:  NAD. No resp difficulty, elderly, walked into clinic HEENT: Normal Neck: Supple. No JVD. Carotids 2+ bilat; no bruits. No lymphadenopathy or thryomegaly appreciated. Cor: PMI nondisplaced. Regular rate & rhythm. No rubs, gallops or murmurs. Lungs: Clear Abdomen: Soft, nontender, nondistended. No hepatosplenomegaly. No bruits or masses. Good bowel sounds. Extremities: No cyanosis, clubbing, rash, edema Neuro: Alert & oriented x 3, cranial nerves grossly intact. Moves all 4 extremities w/o difficulty. Affect pleasant.  ECG (personally reviewed): NSR with PVC  ReDs: 25%  ASSESSMENT & PLAN:  1. Chronic Systolic HF, with mildly reduced EF - Initial event certainly sounds suspicious for TakoTsubo following the death of her husband in 03/16/12, but subsequently may have had a component of PVC-related CM - Echo Mar 16, 2012 EF 35% mild to moderate TR.  - Cath March 16, 2012 no CAD Felt to be Tako-tsubo vs PVC-mediated - Holter 1/12 PVC burden 19.7% - Holter 11/13 PVC burden 21% - Holter 11/21 PVC burden 4% - Echo 03-16-2020 EF 55% moderate MR mild PAH G2DD - Echo 6/22 EF 45-50% RV ok - cMRI (1/23) unable to calculate LVEF or RVEF, extensive mid-wall basal septal LGE may be indicative of prior myocarditis. We discussed these results today. - Stable NYHA IIb. Volume status looks good. ReDs 25% - Continue Lasix 20 mg PRN.  - Continue spiro 12.5 mg daily. - Continue bisoprolol 10 mg daily. - BP has not tolerated lisinopril. - No SGLT2i at this point with risk for volume depletion. Can reconsider as  needed. - Update echo. - Has labs arranged at PCP visit for next week. Will request labs when resulted.  2. Exertional chest tightness - cath 2012/03/16 no CAD - CT March 16, 2020 minimal coronary calcifications - Dr. Gala Romney reviewed options of stress testing, cath or coronary CT - No further episodes.   3. Frequent PVCs - Holters as above. Burden much improved.  - ECG today with 1 PVC - Continue beta blocker  4. Carotid artery stenosis - Nonobstructive. Asymptomatic - Continue ECASA/statin.  5. COPD with chronic hypoxic respiratory failure, on home O2 - Followed by Pulmonary (Dr. Tonia Brooms).  - No wheezing on exam. 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  Follow up in 4 months with Dr. Gala Romney.  Jacklynn Ganong, FNP  12:20 PM

## 2023-02-15 ENCOUNTER — Encounter (HOSPITAL_COMMUNITY): Payer: Self-pay

## 2023-02-15 ENCOUNTER — Ambulatory Visit (HOSPITAL_COMMUNITY)
Admission: RE | Admit: 2023-02-15 | Discharge: 2023-02-15 | Disposition: A | Discharge status: Home / Self Care | Payer: PPO | Source: Ambulatory Visit | Attending: Family Medicine | Admitting: Family Medicine

## 2023-02-15 ENCOUNTER — Other Ambulatory Visit (HOSPITAL_COMMUNITY): Payer: Self-pay | Admitting: *Deleted

## 2023-02-15 VITALS — BP 120/70 | HR 60 | Ht 63.0 in | Wt 128.6 lb

## 2023-02-15 DIAGNOSIS — J9611 Chronic respiratory failure with hypoxia: Secondary | ICD-10-CM | POA: Insufficient documentation

## 2023-02-15 DIAGNOSIS — I4510 Unspecified right bundle-branch block: Secondary | ICD-10-CM | POA: Diagnosis not present

## 2023-02-15 DIAGNOSIS — Z79899 Other long term (current) drug therapy: Secondary | ICD-10-CM | POA: Diagnosis not present

## 2023-02-15 DIAGNOSIS — I110 Hypertensive heart disease with heart failure: Secondary | ICD-10-CM | POA: Diagnosis not present

## 2023-02-15 DIAGNOSIS — I452 Bifascicular block: Secondary | ICD-10-CM

## 2023-02-15 DIAGNOSIS — R079 Chest pain, unspecified: Secondary | ICD-10-CM

## 2023-02-15 DIAGNOSIS — J4489 Other specified chronic obstructive pulmonary disease: Secondary | ICD-10-CM | POA: Diagnosis not present

## 2023-02-15 DIAGNOSIS — J449 Chronic obstructive pulmonary disease, unspecified: Secondary | ICD-10-CM

## 2023-02-15 DIAGNOSIS — R0789 Other chest pain: Secondary | ICD-10-CM | POA: Insufficient documentation

## 2023-02-15 DIAGNOSIS — I6523 Occlusion and stenosis of bilateral carotid arteries: Secondary | ICD-10-CM | POA: Diagnosis not present

## 2023-02-15 DIAGNOSIS — Z9981 Dependence on supplemental oxygen: Secondary | ICD-10-CM | POA: Diagnosis not present

## 2023-02-15 DIAGNOSIS — I493 Ventricular premature depolarization: Secondary | ICD-10-CM | POA: Diagnosis not present

## 2023-02-15 DIAGNOSIS — I5022 Chronic systolic (congestive) heart failure: Secondary | ICD-10-CM | POA: Diagnosis not present

## 2023-02-15 DIAGNOSIS — I6529 Occlusion and stenosis of unspecified carotid artery: Secondary | ICD-10-CM | POA: Diagnosis not present

## 2023-02-15 NOTE — Patient Instructions (Signed)
No change in medication. Echo ordered, will call you to schedule.  Return to see Dr. Gala Romney in 3 - 4 months. We will call you to schedule this appointment. Please call us at [phone] if any questions or concerns prior to your next visit.

## 2023-02-15 NOTE — Progress Notes (Signed)
ReDS Vest / Clip - 02/15/23 1216       ReDS Vest / Clip   Station Marker A    Ruler Value 25    ReDS Value Range Low volume    ReDS Actual Value 25

## 2023-02-22 DIAGNOSIS — Z23 Encounter for immunization: Secondary | ICD-10-CM | POA: Diagnosis not present

## 2023-02-22 DIAGNOSIS — I5022 Chronic systolic (congestive) heart failure: Secondary | ICD-10-CM | POA: Diagnosis not present

## 2023-02-22 DIAGNOSIS — I10 Essential (primary) hypertension: Secondary | ICD-10-CM | POA: Diagnosis not present

## 2023-02-22 DIAGNOSIS — M8589 Other specified disorders of bone density and structure, multiple sites: Secondary | ICD-10-CM | POA: Diagnosis not present

## 2023-02-22 DIAGNOSIS — E785 Hyperlipidemia, unspecified: Secondary | ICD-10-CM | POA: Diagnosis not present

## 2023-02-22 DIAGNOSIS — J449 Chronic obstructive pulmonary disease, unspecified: Secondary | ICD-10-CM | POA: Diagnosis not present

## 2023-03-03 ENCOUNTER — Telehealth: Payer: Self-pay

## 2023-03-03 NOTE — Telephone Encounter (Signed)
Transition Care Management Follow-up Telephone Call Date of discharge and from where: 01/30/2023 [facility] How have you been since you were released from the hospital? Patient stated she is feeling much better. Any questions or concerns? No  Items Reviewed: Did the pt receive and understand the discharge instructions provided? Yes  Medications obtained and verified? Yes  Other? No  Any new allergies since your discharge? No  Dietary orders reviewed? Yes Do you have support at home?  Patient lives with her son who is also her caregiver.  Follow up appointments reviewed:  PCP Hospital f/u appt confirmed? No  Scheduled to see  on  @ . Specialist Hospital f/u appt confirmed? Yes  Scheduled to see Anderson Malta. Wende Mott, FNP on 02/15/2024 @ [facility]. Are transportation arrangements needed?  Patient's son provides transportation but she requested information for [employer] for backup. If their condition worsens, is the pt aware to call PCP or go to the Emergency Dept.? Yes Was the patient provided with contact information for the PCP's office or ED? Yes Was to pt encouraged to call back with questions or concerns? Yes   Louis Ivery [facility]  [facility], [facility] Guide Direct Dial: [phone]  Website: [email or web address]

## 2023-03-06 DIAGNOSIS — J449 Chronic obstructive pulmonary disease, unspecified: Secondary | ICD-10-CM | POA: Diagnosis not present

## 2023-03-29 ENCOUNTER — Ambulatory Visit (HOSPITAL_COMMUNITY)
Admission: RE | Admit: 2023-03-29 | Discharge: 2023-03-29 | Disposition: A | Discharge status: Home / Self Care | Payer: PPO | Source: Ambulatory Visit | Attending: Internal Medicine | Admitting: Internal Medicine

## 2023-03-29 DIAGNOSIS — I110 Hypertensive heart disease with heart failure: Secondary | ICD-10-CM | POA: Insufficient documentation

## 2023-03-29 DIAGNOSIS — I5022 Chronic systolic (congestive) heart failure: Secondary | ICD-10-CM | POA: Diagnosis not present

## 2023-03-29 DIAGNOSIS — I272 Pulmonary hypertension, unspecified: Secondary | ICD-10-CM | POA: Diagnosis not present

## 2023-03-29 DIAGNOSIS — J449 Chronic obstructive pulmonary disease, unspecified: Secondary | ICD-10-CM | POA: Insufficient documentation

## 2023-03-29 DIAGNOSIS — I779 Disorder of arteries and arterioles, unspecified: Secondary | ICD-10-CM | POA: Diagnosis not present

## 2023-03-29 DIAGNOSIS — I493 Ventricular premature depolarization: Secondary | ICD-10-CM | POA: Diagnosis not present

## 2023-03-29 DIAGNOSIS — G4730 Sleep apnea, unspecified: Secondary | ICD-10-CM | POA: Diagnosis not present

## 2023-03-29 LAB — ECHOCARDIOGRAM COMPLETE
Area-P 1/2: 1.42 cm2
Calc EF: 44.5 %
MV M vel: 1.12 m/s
MV Peak grad: 5 mm[Hg]
MV VTI: 1.3 cm2
S' Lateral: 3.7 cm
Single Plane A2C EF: 44 %
Single Plane A4C EF: 42.4 %

## 2023-03-29 NOTE — Progress Notes (Signed)
  Echocardiogram 2D Echocardiogram has been performed.  Joan Smith 03/29/2023, 11:56 AM

## 2023-04-05 DIAGNOSIS — J449 Chronic obstructive pulmonary disease, unspecified: Secondary | ICD-10-CM | POA: Diagnosis not present

## 2023-04-07 ENCOUNTER — Other Ambulatory Visit: Payer: Self-pay | Admitting: Pulmonary Disease

## 2023-04-07 ENCOUNTER — Telehealth: Payer: Self-pay | Admitting: Nurse Practitioner

## 2023-04-07 DIAGNOSIS — J441 Chronic obstructive pulmonary disease with (acute) exacerbation: Secondary | ICD-10-CM

## 2023-04-07 NOTE — Telephone Encounter (Signed)
Joan Smith with [employer] is requesting documentation (meeting criteria) for need of oxygen. Joan Smith reports that they need either PFT or 6 minute walk. She reports oxygen will be denied by insurance at the end of the day. [phone] direct number for Joan Smith.  Please advise.

## 2023-04-07 NOTE — Telephone Encounter (Signed)
Calling to req clinical notes , set to expire today

## 2023-04-07 NOTE — Telephone Encounter (Signed)
Patient advised that she needs an office visit for a qualifying walk. Patient will call back for appt.

## 2023-04-07 NOTE — Telephone Encounter (Signed)
I didn't order her oxygen; Dr. Tonia Brooms ordered the renewal in February 2024. She doesn't need a PFT or a 6 MWT for oxygen. She just needs a qualifying walk. She'll have to have an OV for this as well. Also, this is the first notice I see from them that we have gotten so I don't think it's acceptable that they are going to discontinue her oxygen by the end of the day without previous notice, that could potentially lead to hospitalization due to respiratory failure. It was also ordered in February; it's now December so I'm a little unclear on the timeline.

## 2023-04-09 ENCOUNTER — Other Ambulatory Visit (HOSPITAL_COMMUNITY): Payer: Self-pay

## 2023-04-09 DIAGNOSIS — I5022 Chronic systolic (congestive) heart failure: Secondary | ICD-10-CM

## 2023-04-09 MED ORDER — SPIRONOLACTONE 25 MG PO TABS
12.5000 mg | ORAL_TABLET | Freq: Every day | ORAL | 1 refills | Status: DC
Start: 2023-04-09 — End: 2023-06-03

## 2023-04-19 DIAGNOSIS — J441 Chronic obstructive pulmonary disease with (acute) exacerbation: Secondary | ICD-10-CM | POA: Diagnosis not present

## 2023-04-27 DIAGNOSIS — J441 Chronic obstructive pulmonary disease with (acute) exacerbation: Secondary | ICD-10-CM | POA: Diagnosis not present

## 2023-05-06 DIAGNOSIS — J449 Chronic obstructive pulmonary disease, unspecified: Secondary | ICD-10-CM | POA: Diagnosis not present

## 2023-05-10 NOTE — Telephone Encounter (Signed)
 Patient scheduled with Ames Dura, NP 05/11/2023 at 8:30am for an acute visit

## 2023-05-11 ENCOUNTER — Ambulatory Visit (INDEPENDENT_AMBULATORY_CARE_PROVIDER_SITE_OTHER): Payer: PPO

## 2023-05-11 ENCOUNTER — Ambulatory Visit (INDEPENDENT_AMBULATORY_CARE_PROVIDER_SITE_OTHER): Payer: PPO | Admitting: Primary Care

## 2023-05-11 VITALS — BP 124/64 | HR 51 | Temp 97.7°F | Ht 63.0 in | Wt 135.0 lb

## 2023-05-11 DIAGNOSIS — R918 Other nonspecific abnormal finding of lung field: Secondary | ICD-10-CM | POA: Diagnosis not present

## 2023-05-11 DIAGNOSIS — R0602 Shortness of breath: Secondary | ICD-10-CM | POA: Diagnosis not present

## 2023-05-11 DIAGNOSIS — J42 Unspecified chronic bronchitis: Secondary | ICD-10-CM | POA: Diagnosis not present

## 2023-05-11 DIAGNOSIS — R053 Chronic cough: Secondary | ICD-10-CM

## 2023-05-11 DIAGNOSIS — I700 Atherosclerosis of aorta: Secondary | ICD-10-CM | POA: Diagnosis not present

## 2023-05-11 DIAGNOSIS — R059 Cough, unspecified: Secondary | ICD-10-CM | POA: Diagnosis not present

## 2023-05-11 LAB — COMPREHENSIVE METABOLIC PANEL
ALT: 7 U/L (ref 0–35)
AST: 17 U/L (ref 0–37)
Albumin: 3.9 g/dL (ref 3.5–5.2)
Alkaline Phosphatase: 85 U/L (ref 39–117)
BUN: 40 mg/dL — ABNORMAL HIGH (ref 6–23)
CO2: 28 meq/L (ref 19–32)
Calcium: 9.2 mg/dL (ref 8.4–10.5)
Chloride: 102 meq/L (ref 96–112)
Creatinine, Ser: 1.01 mg/dL (ref 0.40–1.20)
GFR: 51.27 mL/min — ABNORMAL LOW (ref >60.00)
Glucose, Bld: 93 mg/dL (ref 70–99)
Potassium: 4.8 meq/L (ref 3.5–5.1)
Sodium: 137 meq/L (ref 135–145)
Total Bilirubin: 0.7 mg/dL (ref 0.2–1.2)
Total Protein: 6.6 g/dL (ref 6.0–8.3)

## 2023-05-11 LAB — CBC WITH DIFFERENTIAL/PLATELET
Basophils Absolute: 0 10*3/uL (ref 0.0–0.1)
Basophils Relative: 0.3 % (ref 0.0–3.0)
Eosinophils Absolute: 0.1 10*3/uL (ref 0.0–0.7)
Eosinophils Relative: 0.8 % (ref 0.0–5.0)
HCT: 42.4 % (ref 36.0–46.0)
Hemoglobin: 13.8 g/dL (ref 12.0–15.0)
Lymphocytes Relative: 20.5 % (ref 12.0–46.0)
Lymphs Abs: 2.2 10*3/uL (ref 0.7–4.0)
MCHC: 32.7 g/dL (ref 30.0–36.0)
MCV: 91.9 fL (ref 78.0–100.0)
Monocytes Absolute: 1 10*3/uL (ref 0.1–1.0)
Monocytes Relative: 9.2 % (ref 3.0–12.0)
Neutro Abs: 7.5 10*3/uL (ref 1.4–7.7)
Neutrophils Relative %: 69.2 % (ref 43.0–77.0)
Platelets: 193 10*3/uL (ref 150.0–400.0)
RBC: 4.61 Mil/uL (ref 3.87–5.11)
RDW: 14.4 % (ref 11.5–15.5)
WBC: 10.9 10*3/uL — ABNORMAL HIGH (ref 4.0–10.5)

## 2023-05-11 LAB — BRAIN NATRIURETIC PEPTIDE: Pro B Natriuretic peptide (BNP): 331 pg/mL — ABNORMAL HIGH (ref 0.0–100.0)

## 2023-05-11 MED ORDER — DOXYCYCLINE HYCLATE 100 MG PO TABS
100.0000 mg | ORAL_TABLET | Freq: Two times a day (BID) | ORAL | 0 refills | Status: DC
Start: 2023-05-11 — End: 2023-06-29

## 2023-05-11 NOTE — Progress Notes (Signed)
 Please let patient know CXR showed bronchitic changed, pneumonia not excluded. Wbc very slightly elevated. I will send in abx. BNP (fluid level) was also elevated, she needs to resume spirolactone. Her electrolytes and creatinine/GFR (kidney function) were ok. Follow up with PCP in 1-2 weeks recommended.

## 2023-05-11 NOTE — Progress Notes (Signed)
 @Patient  ID: Joan Smith, female    DOB: 22-Jan-1941, 83 y.o.   MRN: [id number]  Chief Complaint  Patient presents with   Acute Visit    C/o cough x 1 wk. Had 2 rounds of Prednisone , cough small amt. Productive no fever, sob, wheezing,    Referring provider: Keren [name], MD  HPI: 83 year old female, former smoker.  Past medical history CHF, pulmonary hypertension, TIA, COPD with asthma, chronic respiratory failure, restrictive cardiomyopathy, CHF, obstructive sleep apnea. Patient of Dr. Brenna.    05/11/2023 Discussed the use of AI scribe software for clinical note transcription with the patient, who gave verbal consent to proceed.  History of Present Illness   The patient, with a known history of COPD, presents with a persistent cough that started before the holidays, approximately two to three months ago. The cough was initially managed with two rounds of prednisone , which provided temporary relief but symptoms returned shortly after completion of each course. The patient reports a congested cough and wheezing, but denies expectoration. She also reports a continuous runny nose, suggestive of postnasal drip.  The patient has been using Flonase  and Astelin for allergies, but reports limited efficacy. She has previously used ipatropium but if not currently using.  In addition to the respiratory symptoms, the patient has a history of congestive heart failure and was transitioned from Lasix  to spironolactone  by her cardiologist. However, there is uncertainty about the patient's adherence to the spironolactone  regimen, as the medication appears to be missing from the home. Her son thinks they have have a refill on file at her pharmacy.   The patient's medication management has been further complicated by a recent incident where she may have consumed a week's worth of medication in one morning. Typical heart rate runs in the 50-60s per her son.   The patient also reports increased  urinary frequency at night, waking up three to four times to urinate. She attributes this to high water intake and denies any pain or discomfort during urination. No acute confusion, dizziness, lightheadedness.   In summary, the patient presents with a persistent cough and wheezing, likely related to COPD, and a potential medication management issue. The patient's adherence to her spironolactone  regimen is uncertain, raising concerns about fluid management in the context of her congestive heart failure.      Allergies  Allergen Reactions   Amoxicillin Diarrhea and Nausea And Vomiting   Ceclor [Cefaclor] Hives and Rash    Immunization History  Administered Date(s) Administered   Fluad Quad(high Dose 65+) 05/31/2020, 01/03/2023   Influenza, Seasonal, Injecte, Preservative Fre 02/14/2012   PFIZER(Purple Top)SARS-COV-2 Vaccination 07/30/2019, 08/22/2019    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     azelastine (ASTELIN) 0.1 % nasal spray Place 2 sprays into the nose  in the morning and at bedtime.     benzonatate  (TESSALON ) 200 MG capsule Take 1 capsule (200 mg total) by mouth 3 (three) times daily as needed for cough. 30 capsule 2   bisoprolol  (ZEBETA ) 10 MG tablet Take 1 tablet (10 mg total) by mouth daily. 90 tablet 2   BREZTRI  AEROSPHERE 160-9-4.8 MCG/ACT AERO INHALE 2 PUFFS INTO THE LUNGS IN THE MORNING AND AT BEDTIME 10.7 g 3   cetirizine (ZYRTEC) 10 MG tablet Take 10 mg by mouth daily.     famotidine  (PEPCID ) 20 MG tablet Take 1 tablet (20 mg total) by mouth 2 (two) times daily. 60 tablet 5   fluticasone  (FLONASE ) 50 MCG/ACT nasal spray Place 2 sprays into both nostrils daily.     ipratropium (ATROVENT ) 0.03 % nasal spray Place 2 sprays into both nostrils 3 (three) times daily. 30 mL 5   levalbuterol (XOPENEX HFA) 45 MCG/ACT inhaler Inhale 2 puffs into the lungs as needed for wheezing or shortness of breath.     magnesium oxide (MAG-OX) 400 MG tablet Take 400 mg by mouth daily.     rosuvastatin  (CRESTOR ) 20 MG tablet Take 1 tablet by mouth at bedtime.     Spacer/Aero-Holding Raguel FRENCH Use with inhaler 1 each 2   spironolactone  (ALDACTONE ) 25 MG tablet Take 0.5 tablets (12.5 mg total) by mouth daily. 15 tablet 1   furosemide  (LASIX ) 20 MG tablet Take 1 tablet (20 mg total) by mouth as needed. (Patient not taking: Reported on 02/15/2023) 30 tablet 1   No facility-administered medications prior to visit.    Review of Systems  Review of Systems  Constitutional: Negative.   HENT:  Positive for congestion.   Respiratory:  Positive for cough and wheezing.   Neurological: Negative.  Negative for dizziness and light-headedness.   Physical Exam  BP 124/64 (BP Location: Left Arm, Cuff Size: Normal)   Pulse (!) 51   Temp 97.7 F (36.5 C) (Temporal)   Ht 5' 3 (1.6 m)   Wt 135 lb (61.2 kg)   SpO2 95%   BMI 23.91 kg/m  Physical Exam Constitutional:      Appearance: Normal appearance.  HENT:     Head: Normocephalic and atraumatic.   Cardiovascular:     Rate and Rhythm: Regular rhythm. Bradycardia present.  Pulmonary:     Breath sounds: Wheezing present.  Neurological:     General: No focal deficit present.     Mental Status: She is alert and oriented to person, place, and time. Mental status is at baseline.  Psychiatric:        Mood and Affect: Mood normal.        Behavior: Behavior normal.        Thought Content: Thought content normal.        Judgment: Judgment normal.      Lab Results:  CBC    Component Value Date/Time   WBC 9.4 10/09/2022 1255   RBC 4.74 10/09/2022 1255   HGB 14.0 10/09/2022 1255   HGB 14.2 05/21/2021 1015   HCT 44.9 10/09/2022 1255   HCT 43.2 05/21/2021 1015   PLT 223 10/09/2022 1255   PLT 239 05/21/2021 1015   MCV  94.7 10/09/2022 1255   MCV 92 05/21/2021 1015   MCH 29.5 10/09/2022 1255   MCHC 31.2 10/09/2022 1255   RDW 13.2 10/09/2022 1255   RDW 12.9 05/21/2021 1015   LYMPHSABS 2.0 11/05/2020 1330   EOSABS 0.1 11/05/2020 1330   BASOSABS 0.0 11/05/2020 1330    BMET    Component Value Date/Time   NA 139 10/09/2022 1255   NA 141 11/25/2020 1421   K 5.1 10/09/2022 1255   CL 100 10/09/2022 1255   CO2 31 10/09/2022 1255   GLUCOSE 92 10/09/2022 1255   BUN 30 (H) 10/09/2022 1255   BUN 24 11/25/2020 1421   CREATININE 1.15 (H) 10/09/2022 1255   CALCIUM  9.8 10/09/2022 1255   GFRNONAA 47 (L) 10/09/2022 1255    BNP No results found for: BNP  ProBNP No results found for: PROBNP  Imaging: No results found.   Assessment & Plan:   1. Chronic cough (Primary) - Brain natriuretic peptide; Future - Comp Met (CMET); Future - CBC w/Diff; Future - DG Chest 2 View; Future - CBC w/Diff - Brain natriuretic peptide - Comp Met (CMET)  2. Shortness of breath - Brain natriuretic peptide; Future - Comp Met (CMET); Future - CBC w/Diff; Future - DG Chest 2 View; Future - CBC w/Diff - Brain natriuretic peptide - Comp Met (CMET)     Chronic Obstructive Pulmonary  Disease (COPD) Exacerbation Persistent cough and wheezing for several months despite two rounds of prednisone . Auscultation revealed upper airway wheezing and possible fluid in the left lower lobe. -Continue Tessalon  for cough. -Order chest X-ray and labs (CBC, CMP, BNP) to assess for fluid overload and electrolyte imbalances. -Consider reevaluation of COPD management after lab results.  Postnasal Drip Complaints of constant runny nose contributing to cough. -Discontinue Astelin and Flonase . -Start Ipatropium nasal spray to reduce postnasal drip.  Congestive Heart Failure (CHF) Possible fluid retention contributing to cough and wheezing. Spironolactone  prescription appears to have lapsed. -Check labs (CBC, CMP, BNP) to assess fluid status and electrolyte balance. -Contact Dr. Bensimhon if needed regarding spironolactone  prescription.  Medication Management Concerns about patient taking a week's worth of medication in one day -Hold bisoprolol  tomorrow  -Check labs (CBC, CMP) to assess for potential medication overdose effects. -Implement a new medication management system, such as daily envelopes, to prevent medication errors.  Follow-up Pending lab and chest X-ray results.      Almarie LELON Ferrari, NP 05/11/2023

## 2023-05-11 NOTE — Patient Instructions (Addendum)
 Recommendations: Ipratropium nasal spray morning and evening  Continue Breztri  Aerosphere two puffs morning and evening  Tessalon  perle morning, afternoon and evening as needed for cough   Orders: Labs and CXR today  Follow-up If symptoms do not improved 3 months with Dr. Brenna or Landry NP     -CHRONIC OBSTRUCTIVE PULMONARY DISEASE (COPD) EXACERBATION: COPD is a chronic lung condition that makes it hard to breathe. Your persistent cough and wheezing may be due to a worsening of this condition. We will continue your Tessalon  for cough relief and have ordered a chest X-ray and blood tests to check for fluid overload and electrolyte imbalances. We will reevaluate your COPD management after we get the lab results.  -POSTNASAL DRIP: Postnasal drip occurs when excess mucus from the nose drips down the back of the throat, causing a cough. We will discontinue your current nasal sprays (Astelin and Flonase ) and start you on Ipatropium nasal spray to help reduce the postnasal drip.  -CONGESTIVE HEART FAILURE (CHF): CHF is a condition where the heart doesn't pump blood as well as it should, leading to fluid buildup. We are concerned that fluid retention may be contributing to your cough and wheezing. We have ordered blood tests to check your fluid status and electrolyte balance and will contact your cardiologist about your spironolactone  prescription.  -MEDICATION MANAGEMENT: Proper medication management is crucial for your health. We are concerned about a recent incident where you may have taken a week's worth of medication in one day. We will hold your bisoprolol  for one day due to a low heart rate and have ordered blood tests to check for any effects of potential medication overdose. We will also implement a new system, such as daily envelopes, to help you manage your medications better.

## 2023-05-11 NOTE — Addendum Note (Signed)
 Addended by: Glenford Bayley on: 05/11/2023 01:13 PM   Modules accepted: Orders

## 2023-05-13 ENCOUNTER — Telehealth: Payer: Self-pay | Admitting: Primary Care

## 2023-05-13 NOTE — Telephone Encounter (Signed)
 Calling for medical necessity form

## 2023-05-27 DIAGNOSIS — Z0000 Encounter for general adult medical examination without abnormal findings: Secondary | ICD-10-CM | POA: Diagnosis not present

## 2023-05-27 DIAGNOSIS — Z9181 History of falling: Secondary | ICD-10-CM | POA: Diagnosis not present

## 2023-05-28 ENCOUNTER — Other Ambulatory Visit (HOSPITAL_COMMUNITY): Payer: Self-pay | Admitting: Internal Medicine

## 2023-05-28 DIAGNOSIS — I5022 Chronic systolic (congestive) heart failure: Secondary | ICD-10-CM

## 2023-06-01 DIAGNOSIS — I5022 Chronic systolic (congestive) heart failure: Secondary | ICD-10-CM | POA: Diagnosis not present

## 2023-06-01 DIAGNOSIS — J449 Chronic obstructive pulmonary disease, unspecified: Secondary | ICD-10-CM | POA: Diagnosis not present

## 2023-06-01 DIAGNOSIS — M8589 Other specified disorders of bone density and structure, multiple sites: Secondary | ICD-10-CM | POA: Diagnosis not present

## 2023-06-01 DIAGNOSIS — I10 Essential (primary) hypertension: Secondary | ICD-10-CM | POA: Diagnosis not present

## 2023-06-01 DIAGNOSIS — J3489 Other specified disorders of nose and nasal sinuses: Secondary | ICD-10-CM | POA: Diagnosis not present

## 2023-06-01 DIAGNOSIS — E785 Hyperlipidemia, unspecified: Secondary | ICD-10-CM | POA: Diagnosis not present

## 2023-06-06 DIAGNOSIS — J449 Chronic obstructive pulmonary disease, unspecified: Secondary | ICD-10-CM | POA: Diagnosis not present

## 2023-06-08 ENCOUNTER — Other Ambulatory Visit (HOSPITAL_COMMUNITY): Payer: Self-pay

## 2023-06-08 DIAGNOSIS — I5022 Chronic systolic (congestive) heart failure: Secondary | ICD-10-CM

## 2023-06-08 MED ORDER — SPIRONOLACTONE 25 MG PO TABS
12.5000 mg | ORAL_TABLET | Freq: Every day | ORAL | 0 refills | Status: DC
Start: 2023-06-08 — End: 2023-08-06

## 2023-06-24 ENCOUNTER — Other Ambulatory Visit (HOSPITAL_COMMUNITY): Payer: Self-pay

## 2023-06-24 DIAGNOSIS — I5022 Chronic systolic (congestive) heart failure: Secondary | ICD-10-CM

## 2023-06-24 MED ORDER — BISOPROLOL FUMARATE 10 MG PO TABS
10.0000 mg | ORAL_TABLET | Freq: Every day | ORAL | 0 refills | Status: AC
Start: 2023-06-24 — End: ?

## 2023-06-28 NOTE — Progress Notes (Incomplete)
 Advanced Heart Failure Clinic Note    PCP: Paulina Fusi, MD  Primary Cardiologist: Formerly Gunnar Fusi [facility]) - relocated to [place] 07/2020 HF Cardiologist: Dr. Gala Romney  HPI: Ms. Bagot is a 83 y.o. woman (family friend of Cristy Friedlander in cath lab) referred by Dr. 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   Her husband died in 2012/02/27. Shortly after that admitted with acute HF with 25 pounds of fluid.   Echo 2013 EF 35% mild to moderate TR. Cath 2013 no CAD Felt to be Tako-tsubo vs PVC-mediated  Holter 1/12 PVC burden 19.7% Holter 11/13 PVC burden 21% Holter 11/21 PVC burden 4% (no ablation or AAD)   Echo 2021 EF 55% moderate MR mild PAH G2DD  Previously managed at [facility] in [place] - relocated to [place] 07/2020 to live with her son.   Initial visit 10/22, NYHA II and volume stable. cMRI ordered to evaluate cause of CM.  Echo (10/22) EF 45-50% RV ok.  cMRI (1/23): difficult study, unable to calculate RVEF or LVEF;  - Noncoronary LGE pattern in the basal septum. The RV insertion sites with LGE can be nonspecific and suggest pressure/volume overload. However, the more extensive mid-wall basal septal LGE may be indicative of prior myocarditis.  Follow up 12/23 with Dr. Gala Romney, she fatigued easily, and was having stable exertional chest tightness she attributed to anxiety. She decided to fogo further testing and would notify clinic if it worsened.  Follow up 6/24 acute visit for bradycardia. HR 32, told to hold beta blocker by PCP. ECG showed frequent PVCs.    Zio- 2 week (6/24): mostly NSR, 11 runs of NSVT, 54 runs of SVT, 8.9% PVC burden  Seen in [place] ED 01/30/23 with hallucinations, +/- sepsis. CT head showed old small  aneurysm, nothing acute. ? HF exacerbation and advised to take Lasix, she only took 1 dose. No records to review.  Today she returns for post ED evaluation follow up with her son. Overall feeling fine, remains fatigued. Main complaint is insomnia. She has mild SOB with ADLs, she attributes this to anxiety. She has dizziness when she bends over, no falls. Feels heart skip a beat occasionally. Denies CP, edema, or PND/Orthopnea. Appetite fair. No fever or chills. Weight at home 127 pounds. Taking all medications. Wears 2L oxygen at night.   ROS: All systems reviewed and negative except as per HPI.   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     benzonatate (TESSALON) 200 MG capsule Take 1 capsule (200 mg total) by mouth 3 (three) times daily as needed for cough. 30 capsule 2   bisoprolol (ZEBETA)  10 MG tablet Take 1 tablet (10 mg total) by mouth daily. 30 tablet 0   BREZTRI AEROSPHERE 160-9-4.8 MCG/ACT AERO INHALE 2 PUFFS INTO THE LUNGS IN THE MORNING AND AT BEDTIME 10.7 g 3   cetirizine (ZYRTEC) 10 MG tablet Take 10 mg by mouth daily.     doxycycline (VIBRA-TABS) 100 MG tablet Take 1 tablet (100 mg total) by mouth 2 (two) times daily. 14 tablet 0   famotidine (PEPCID) 20 MG tablet Take 1 tablet (20 mg total) by mouth 2 (two) times daily. 60 tablet 5   furosemide (LASIX) 20 MG tablet Take 1 tablet (20 mg  total) by mouth as needed. (Patient not taking: Reported on 02/15/2023) 30 tablet 1   ipratropium (ATROVENT) 0.03 % nasal spray Place 2 sprays into both nostrils 3 (three) times daily. 30 mL 5   levalbuterol (XOPENEX HFA) 45 MCG/ACT inhaler Inhale 2 puffs into the lungs as needed for wheezing or shortness of breath.     magnesium oxide (MAG-OX) 400 MG tablet Take 400 mg by mouth daily.     rosuvastatin (CRESTOR) 20 MG tablet Take 1 tablet by mouth at bedtime.     Spacer/Aero-Holding Rudean Curt Use with inhaler 1 each 2   spironolactone (ALDACTONE) 25 MG tablet Take 0.5 tablets (12.5 mg total) by mouth daily. 45 tablet 0   No current facility-administered medications for this visit.   Allergies  Allergen Reactions   Amoxicillin Diarrhea and Nausea And Vomiting   Ceclor [Cefaclor] Hives and Rash   Social History   Socioeconomic History   Marital status: Widowed    Spouse name: Not on file   Number of children: Not on file   Years of education: Not on file   Highest education level: Not on file  Occupational History   Not on file  Tobacco Use   Smoking status: Former    Current packs/day: 0.00    Average packs/day: 1.5 packs/day for 50.0 years (75.0 ttl pk-yrs)    Types: Cigarettes    Start date: 05/04/1949    Quit date: 05/05/1999    Years since quitting: 24.1   Smokeless tobacco: Never  Substance and Sexual Activity   Alcohol use: Never   Drug use: Never   Sexual activity: Not on file  Other Topics Concern   Not on file  Social History Narrative   Not on file   Social Drivers of Health   Financial Resource Strain: Not on file  Food Insecurity: Not on file  Transportation Needs: No Transportation Needs (03/03/2023)   PRAPARE - Administrator, Civil Service (Medical): No    Lack of Transportation (Non-Medical): No  Physical Activity: Not on file  Stress: Not on file  Social Connections: Not on file  Intimate Partner Violence: Not on file   Family History   Problem Relation Age of Onset   COPD Mother    Diabetes Mellitus II Mother    CAD Mother    Heart failure Mother    CAD Father    Heart attack Father    CAD Sister    Cardiomyopathy Brother    There were no vitals taken for this visit.  Wt Readings from Last 3 Encounters:  05/11/23 61.2 kg (135 lb)  02/15/23 58.3 kg (128 lb 9.6 oz)  11/30/22 56 kg (123 lb 6.4 oz)   PHYSICAL EXAM: General:  NAD. No resp difficulty, elderly, walked into clinic HEENT: Normal Neck: Supple. No JVD. Carotids 2+ bilat; no bruits.  No lymphadenopathy or thryomegaly appreciated. Cor: PMI nondisplaced. Regular rate & rhythm. No rubs, gallops or murmurs. Lungs: Clear Abdomen: Soft, nontender, nondistended. No hepatosplenomegaly. No bruits or masses. Good bowel sounds. Extremities: No cyanosis, clubbing, rash, edema Neuro: Alert & oriented x 3, cranial nerves grossly intact. Moves all 4 extremities w/o difficulty. Affect pleasant.  ECG (personally reviewed): NSR with PVC  ReDs: 25%  ASSESSMENT & PLAN:  1. Chronic Systolic HF, with mildly reduced EF - Initial event certainly sounds suspicious for TakoTsubo following the death of her husband in 18-Jul-2011, but subsequently may have had a component of PVC-related CM - Echo 07/18/11 EF 35% mild to moderate TR.  - Cath Jul 18, 2011 no CAD Felt to be Tako-tsubo vs PVC-mediated - Holter 1/12 PVC burden 19.7% - Holter 11/13 PVC burden 21% - Holter 11/21 PVC burden 4% - Echo 07/18/19 EF 55% moderate MR mild PAH G2DD - Echo 6/22 EF 45-50% RV ok - cMRI (1/23) unable to calculate LVEF or RVEF, extensive mid-wall basal septal LGE may be indicative of prior myocarditis. We discussed these results today. - Stable NYHA IIb. Volume status looks good. ReDs 25% - Continue Lasix 20 mg PRN.  - Continue spiro 12.5 mg daily. - Continue bisoprolol 10 mg daily. - BP has not tolerated lisinopril. - No SGLT2i at this point with risk for volume depletion. Can reconsider as needed. - Update  echo. - Has labs arranged at PCP visit for next week. Will request labs when resulted.  2. Exertional chest tightness - cath 2011/07/18 no CAD - CT July 18, 2019 minimal coronary calcifications - Dr. Gala Romney reviewed options of stress testing, cath or coronary CT - No further episodes.   3. Frequent PVCs - Holters as above. Burden much improved.  - ECG today with 1 PVC - Continue beta blocker  4. Carotid artery stenosis - Nonobstructive. Asymptomatic - Continue ECASA/statin.  5. COPD with chronic hypoxic respiratory failure, on home O2 - Followed by Pulmonary (Dr. Tonia Brooms).  - No wheezing on exam. 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  Follow up in 4 months with Dr. Gala Romney.  Shantella Blubaugh, PA-C  1:18 PM

## 2023-06-29 ENCOUNTER — Encounter (HOSPITAL_COMMUNITY): Payer: Self-pay

## 2023-06-29 ENCOUNTER — Ambulatory Visit (HOSPITAL_COMMUNITY)
Admission: RE | Admit: 2023-06-29 | Discharge: 2023-06-29 | Discharge status: Home / Self Care | Payer: PPO | Source: Ambulatory Visit | Attending: Cardiology

## 2023-06-29 VITALS — BP 100/70 | HR 53 | Wt 136.0 lb

## 2023-06-29 DIAGNOSIS — J8410 Pulmonary fibrosis, unspecified: Secondary | ICD-10-CM | POA: Insufficient documentation

## 2023-06-29 DIAGNOSIS — J9611 Chronic respiratory failure with hypoxia: Secondary | ICD-10-CM | POA: Insufficient documentation

## 2023-06-29 DIAGNOSIS — Z9981 Dependence on supplemental oxygen: Secondary | ICD-10-CM | POA: Diagnosis not present

## 2023-06-29 DIAGNOSIS — Z79899 Other long term (current) drug therapy: Secondary | ICD-10-CM | POA: Diagnosis not present

## 2023-06-29 DIAGNOSIS — Z87891 Personal history of nicotine dependence: Secondary | ICD-10-CM | POA: Diagnosis not present

## 2023-06-29 DIAGNOSIS — M1990 Unspecified osteoarthritis, unspecified site: Secondary | ICD-10-CM | POA: Insufficient documentation

## 2023-06-29 DIAGNOSIS — I6529 Occlusion and stenosis of unspecified carotid artery: Secondary | ICD-10-CM | POA: Insufficient documentation

## 2023-06-29 DIAGNOSIS — R002 Palpitations: Secondary | ICD-10-CM | POA: Diagnosis not present

## 2023-06-29 DIAGNOSIS — I110 Hypertensive heart disease with heart failure: Secondary | ICD-10-CM | POA: Insufficient documentation

## 2023-06-29 DIAGNOSIS — I2510 Atherosclerotic heart disease of native coronary artery without angina pectoris: Secondary | ICD-10-CM | POA: Insufficient documentation

## 2023-06-29 DIAGNOSIS — I5022 Chronic systolic (congestive) heart failure: Secondary | ICD-10-CM

## 2023-06-29 DIAGNOSIS — J4489 Other specified chronic obstructive pulmonary disease: Secondary | ICD-10-CM | POA: Diagnosis not present

## 2023-06-29 DIAGNOSIS — I493 Ventricular premature depolarization: Secondary | ICD-10-CM | POA: Insufficient documentation

## 2023-06-29 DIAGNOSIS — Z7982 Long term (current) use of aspirin: Secondary | ICD-10-CM | POA: Diagnosis not present

## 2023-06-29 DIAGNOSIS — I270 Primary pulmonary hypertension: Secondary | ICD-10-CM

## 2023-06-29 NOTE — Patient Instructions (Signed)
  Special Instructions // Education:  Do the following things EVERYDAY: Weigh yourself in the morning before breakfast. Write it down and keep it in a log. Take your medicines as prescribed Eat low salt foods--Limit salt (sodium) to 2000 mg per day.  Stay as active as you can everyday Limit all fluids for the day to less than 2 liters   Follow-Up in: Please follow up with the Advanced Heart Failure Clinic in 4-6 months with Dr. Gala Romney. We currently do not have that schedule. Please call us around June in order to schedule this appointment.   At the Advanced Heart Failure Clinic, you and your health needs are our priority. We have a designated team specialized in the treatment of Heart Failure. This Care Team includes your primary Heart Failure Specialized Cardiologist (physician), Advanced Practice Providers (APPs- Physician Assistants and Nurse Practitioners), and Pharmacist who all work together to provide you with the care you need, when you need it.   You may see any of the following providers on your designated Care Team at your next follow up:  Dr. Arvilla Meres Dr. Marca Ancona Dr. Dorthula Nettles Dr. Theresia Bough Tonye Becket, NP Robbie Lis, [place] [facility] [place], [place] Brynda Peon, NP [place] Lee, NP Karle Plumber, PharmD   Please be sure to bring in all your medications bottles to every appointment.   Need to Contact us:  If you have any questions or concerns before your next appointment please send [place] a message through [place] or call our office at [phone].    TO LEAVE A MESSAGE FOR THE NURSE SELECT OPTION 2, PLEASE LEAVE A MESSAGE INCLUDING: YOUR NAME DATE OF BIRTH CALL BACK NUMBER REASON FOR CALL**this is important as we prioritize the call backs  YOU WILL RECEIVE A CALL BACK THE SAME DAY AS LONG AS YOU CALL BEFORE 4:00 PM

## 2023-07-04 DIAGNOSIS — J449 Chronic obstructive pulmonary disease, unspecified: Secondary | ICD-10-CM | POA: Diagnosis not present

## 2023-07-05 ENCOUNTER — Ambulatory Visit: Payer: PPO | Admitting: Podiatry

## 2023-07-05 ENCOUNTER — Encounter: Payer: Self-pay | Admitting: Podiatry

## 2023-07-05 DIAGNOSIS — B351 Tinea unguium: Secondary | ICD-10-CM

## 2023-07-05 DIAGNOSIS — L84 Corns and callosities: Secondary | ICD-10-CM | POA: Diagnosis not present

## 2023-07-05 DIAGNOSIS — I739 Peripheral vascular disease, unspecified: Secondary | ICD-10-CM

## 2023-07-05 NOTE — Patient Instructions (Signed)
 Look for urea 40% cream or ointment and apply to the thickened dry skin / calluses. This can be bought over the counter, at a pharmacy or online such as [employer].   Some lower concentrations of this that are available at most drug stores include AmLactin or Lac-Hydrin.  These can be applied as needed.  You can also scrub the calluses with white vinegar before showering.  This will make it easier to gently file down the outer layer using a pumice stone or emery board.  More silicone and felt pads can be purchased from:  [email or web address]  Horseshoe pads were dispensed to you today, metatarsal pads may be helpful as well.  We can also findings on [employer].

## 2023-07-05 NOTE — Progress Notes (Unsigned)
       Subjective:  Patient ID: Joan Smith, female    DOB: October 14, 1940,  MRN: [id number]  Joan Smith presents to clinic today for:  Chief Complaint  Patient presents with   Callouses    Bilat ball of foot   RFC    RFC  Patient presenting for at risk footcare.  She has multiple painful calluses on both feet causing her pain.  She denies history of type 2 diabetes.  She does have a history of peripheral arterial disease.  Calluses present bilateral forefoot as well as multiple digital corns.  She is deferring treatment of her nails today.  PCP is Paulina Fusi, MD.  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  Joan Smith is a pleasant 83 y.o. female in NAD. AAO x 3.  Vascular Examination: Patient has faintly palpable DP pulse, absent PT pulse bilateral.  3 to 5 seconds capillary refill bilateral toes.  Diminished digital hair bilateral.  Telangiectasias present.  Dermatological Examination: Interspaces are clear with no open lesions noted bilateral.  Skin is shiny and atrophic bilateral.  Nails are 3-3mm thick, with yellowish/brown discoloration, subungual debris and distal onycholysis x10.  There is pain with compression of nails x10.  There are hyperkeratotic lesions noted bilateral subsecond metatarsal head, distal  tuft of third toe, dorsal third and fourth toe PIPJ causing pain.  Neurological Examination: Protective sensation intact b/l LE. Vibratory sensation intact b/l LE.  Musculoskeletal Examination: Muscle strength 5/5 to all LE muscle groups b/l.       No data to display           Patient qualifies for at-risk foot care because of PAD.  Assessment/Plan: 1. PAD (peripheral artery disease) (HCC)   2. Pain due to onychomycosis of toenails of both feet   3. Corns and callosities     No orders of the defined types were placed in this encounter.  None  Patient deferring treatment of mycotic nails today.  Hyperkeratotic lesions bilateral subsecond metatarsal head, distal right third toe, dorsal third and fourth PIPJ right foot were shaved with #312 blade.   Return in about 3 months (around 10/05/2023) for Routine Foot Care.   Rayshard Schirtzinger L. Marchia Bond, AACFAS [facility]     [address] [place], [place] [address]                Office [phone]  Fax [phone]

## 2023-07-16 DIAGNOSIS — C44329 Squamous cell carcinoma of skin of other parts of face: Secondary | ICD-10-CM | POA: Diagnosis not present

## 2023-07-16 DIAGNOSIS — L821 Other seborrheic keratosis: Secondary | ICD-10-CM | POA: Diagnosis not present

## 2023-07-16 DIAGNOSIS — L578 Other skin changes due to chronic exposure to nonionizing radiation: Secondary | ICD-10-CM | POA: Diagnosis not present

## 2023-07-21 DIAGNOSIS — J441 Chronic obstructive pulmonary disease with (acute) exacerbation: Secondary | ICD-10-CM | POA: Diagnosis not present

## 2023-08-04 DIAGNOSIS — J449 Chronic obstructive pulmonary disease, unspecified: Secondary | ICD-10-CM | POA: Diagnosis not present

## 2023-08-05 DIAGNOSIS — C44329 Squamous cell carcinoma of skin of other parts of face: Secondary | ICD-10-CM | POA: Diagnosis not present

## 2023-08-06 ENCOUNTER — Other Ambulatory Visit (HOSPITAL_COMMUNITY): Payer: Self-pay

## 2023-08-06 DIAGNOSIS — I5022 Chronic systolic (congestive) heart failure: Secondary | ICD-10-CM

## 2023-08-06 MED ORDER — SPIRONOLACTONE 25 MG PO TABS
12.5000 mg | ORAL_TABLET | Freq: Every day | ORAL | 3 refills | Status: DC
Start: 2023-08-06 — End: 2023-11-30

## 2023-08-09 ENCOUNTER — Ambulatory Visit: Payer: PPO | Admitting: Primary Care

## 2023-08-09 ENCOUNTER — Encounter: Payer: Self-pay | Admitting: Primary Care

## 2023-08-09 VITALS — BP 160/76 | HR 54 | Temp 97.1°F | Ht 64.0 in | Wt 140.8 lb

## 2023-08-09 DIAGNOSIS — J9611 Chronic respiratory failure with hypoxia: Secondary | ICD-10-CM

## 2023-08-09 DIAGNOSIS — J4489 Other specified chronic obstructive pulmonary disease: Secondary | ICD-10-CM

## 2023-08-09 NOTE — Progress Notes (Signed)
 @Patient  ID: Joan Smith, female    DOB: July 20, 1940, 83 y.o.   MRN: [id number]  Chief Complaint  Patient presents with   Follow-up    F/U on COPD, Asthma, OSA    Referring provider: Paulina Fusi, MD  HPI: 83 year old female, former smoker.  Past medical history CHF, pulmonary hypertension, TIA, COPD with asthma, chronic respiratory failure (wears nocturnal oxygen), restrictive cardiomyopathy. Patient of Dr. Tonia Brooms.   Previous [facility] pulmonary encounter: 05/11/2023 Discussed the use of AI scribe software for clinical note transcription with the patient, who gave verbal consent to proceed.  History of Present Illness   The patient, with a known history of COPD, presents with a persistent cough that started before the holidays, approximately two to three months ago. The cough was initially managed with two rounds of prednisone, which provided temporary relief but symptoms returned shortly after completion of each course. The patient reports a congested cough and wheezing, but denies expectoration. She also reports a continuous runny nose, suggestive of postnasal drip.  The patient has been using Flonase and Astelin for allergies, but reports limited efficacy. She has previously used ipatropium but if not currently using.  In addition to the respiratory symptoms, the patient has a history of congestive heart failure and was transitioned from Lasix to spironolactone by her cardiologist. However, there is uncertainty about the patient's adherence to the spironolactone regimen, as the medication appears to be missing from the home. Her son thinks they have have a refill on file at her pharmacy.   The patient's medication management has been further complicated by a recent incident where she may have consumed a week's worth of medication in one morning. Typical heart rate runs in the 50-60s per her son.   The patient also reports increased urinary frequency at night, waking up three to four  times to urinate. She attributes this to high water intake and denies any pain or discomfort during urination. No acute confusion, dizziness, lightheadedness.   In summary, the patient presents with a persistent cough and wheezing, likely related to COPD, and a potential medication management issue. The patient's adherence to her spironolactone regimen is uncertain, raising concerns about fluid management in the context of her congestive heart failure.    08/09/2023- Interim hx  Discussed the use of AI scribe software for clinical note transcription with the patient, who gave verbal consent to proceed.  History of Present Illness   Joan Smith is an 83 year old female with heart failure, pulmonary hypertension, COPD, and chronic respiratory failure who presents for a follow-up visit.  She has chronic respiratory failure with hypoxia and uses oxygen at night. Typically, she uses two liters of oxygen, although there was a recent adjustment to three liters, which she was unaware of. She has previously undergone overnight oximetry tests to monitor her oxygen levels during sleep. No recent wheezing or significant respiratory issues have been reported.  She has COPD with an asthmatic component and uses Breztri twice daily, having switched from Trelegy. She also uses a rescue inhaler as needed. No recent wheezing or significant cough, although she experienced a prolonged cough during the winter, which has since resolved. Occasional coughing is attributed to allergies. She is a former smoker with a 40 pack-year history.  She has heart failure, contributing to fatigue. No recent exacerbations or fluid retention have been noted. Her blood pressure readings have been described as 'perfect.' She takes spironolactone, half a tablet, and has furosemide available  as needed, though she has not required it recently. She dislikes frequent nighttime urination, which she attributes to age and possibly medication. She  takes aspirin and has noticed increased bruising.      Allergies  Allergen Reactions   Amoxicillin Diarrhea and Nausea And Vomiting   Ceclor [Cefaclor] Hives and Rash    Immunization History  Administered Date(s) Administered   Fluad Quad(high Dose 65+) 05/31/2020, 01/03/2023   Influenza, Seasonal, Injecte, Preservative Fre 02/14/2012   PFIZER(Purple Top)SARS-COV-2 Vaccination 07/30/2019, 08/22/2019    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     benzonatate (TESSALON) 200 MG capsule Take 1 capsule (200 mg total) by mouth 3 (three) times daily as needed for cough. 30 capsule 2   bisoprolol (ZEBETA) 10 MG tablet Take 1 tablet (10 mg total) by mouth daily. 30 tablet 0   BREZTRI AEROSPHERE 160-9-4.8 MCG/ACT AERO INHALE 2 PUFFS INTO THE LUNGS IN THE MORNING AND AT BEDTIME 10.7 g 3   cetirizine (ZYRTEC) 10 MG tablet Take 10 mg by mouth daily.     ipratropium (ATROVENT) 0.03 % nasal spray Place 2 sprays into both nostrils 3 (three) times daily. 30 mL 5    levalbuterol (XOPENEX HFA) 45 MCG/ACT inhaler Inhale 2 puffs into the lungs as needed for wheezing or shortness of breath.     magnesium oxide (MAG-OX) 400 MG tablet Take 400 mg by mouth daily.     NON FORMULARY RELAXIUM as needed for sleep     rosuvastatin (CRESTOR) 20 MG tablet Take 1 tablet by mouth at bedtime.     Spacer/Aero-Holding Rudean Curt Use with inhaler 1 each 2   spironolactone (ALDACTONE) 25 MG tablet Take 0.5 tablets (12.5 mg total) by mouth daily. 45 tablet 3   furosemide (LASIX) 20 MG tablet Take 1 tablet (20 mg total) by mouth as needed. 30 tablet 1   No facility-administered medications prior to visit.   Review of Systems  Review of Systems  Constitutional:  Positive for fatigue.  HENT: Negative.    Respiratory:  Negative for cough, shortness of breath and wheezing.   Cardiovascular: Negative.  Negative for leg swelling.  Hematological:  Bruises/bleeds easily.   Physical Exam  BP (!) 160/76 (BP Location: Left Arm, Patient Position: Sitting, Cuff Size: Normal)   Pulse (!) 54   Temp (!) 97.1 F (36.2 C) (Temporal)   Ht 5\' 4"  (1.626 m)   Wt 140 lb 12.8 oz (63.9 kg)   SpO2 96%   BMI 24.17 kg/m  Physical Exam Constitutional:      General: She is not in acute distress.    Appearance: Normal appearance. She is not ill-appearing, toxic-appearing or diaphoretic.     Comments: Well appearing  HENT:     Head: Normocephalic and atraumatic.  Cardiovascular:     Rate and Rhythm: Normal rate and regular rhythm.     Comments: No LE edema Pulmonary:     Effort: Pulmonary effort is normal. No respiratory distress.     Breath sounds: No stridor. Rales present. No wheezing or rhonchi.     Comments: O2 96% RA Musculoskeletal:        General: Normal range of motion.  Skin:    General: Skin is warm and dry.  Neurological:     General: No focal deficit present.     Mental Status: She is alert and oriented to person, place, and time. Mental status is at baseline.   Psychiatric:        Mood and Affect: Mood normal.        Behavior: Behavior normal.        Thought Content: Thought content normal.        Judgment: Judgment normal.      Lab Results:  CBC    Component Value Date/Time   WBC 10.9 (H) 05/11/2023 0941   RBC 4.61 05/11/2023 0941   HGB 13.8 05/11/2023 0941   HGB 14.2 05/21/2021 1015   HCT 42.4 05/11/2023 0941   HCT 43.2 05/21/2021 1015   PLT 193.0 05/11/2023 0941   PLT 239 05/21/2021 1015   MCV 91.9 05/11/2023 0941   MCV 92 05/21/2021 1015   MCH 29.5 10/09/2022 1255   MCHC 32.7 05/11/2023 0941   RDW 14.4 05/11/2023 0941   RDW 12.9 05/21/2021 1015   LYMPHSABS 2.2 05/11/2023 0941   LYMPHSABS 2.0 11/05/2020 1330   MONOABS 1.0 05/11/2023 0941   EOSABS 0.1 05/11/2023 0941   EOSABS 0.1 11/05/2020 1330   BASOSABS 0.0 05/11/2023 0941   BASOSABS 0.0 11/05/2020 1330    BMET    Component Value Date/Time   NA 137 05/11/2023 0941   NA 141 11/25/2020 1421   K 4.8 05/11/2023 0941   CL 102 05/11/2023 0941   CO2 28 05/11/2023 0941   GLUCOSE 93 05/11/2023 0941   BUN 40 (H) 05/11/2023 0941   BUN 24 11/25/2020 1421   CREATININE 1.01 05/11/2023 0941   CALCIUM 9.2 05/11/2023 0941   GFRNONAA 47 (L) 10/09/2022 1255    BNP No results found for: "BNP"  ProBNP    Component Value Date/Time   PROBNP 331.0 (H) 05/11/2023 0941    Imaging: No results found.   Assessment & Plan:   1. Chronic respiratory failure with hypoxia (HCC) (Primary) - Pulse oximetry, overnight; Future  2. COPD with asthma (HCC) - Pulse oximetry, overnight; Future  Assessment and Plan    Chronic respiratory failure with hypoxia Chronic respiratory failure with hypoxia, requiring nocturnal oxygen therapy at 2 liters. Oxygen saturation is 96% on room air.  - Order overnight oximetry test on 2 liters of oxygen to assess nocturnal oxygen levels and determine if adjustment is needed.  Chronic obstructive pulmonary disease (COPD) with asthma COPD with an  asthmatic component, managed with Breztri inhaler. Former smoker with a 40 pack-year history. Occasional cough due to allergies. - Continue Breztri inhaler twice daily. - Use rescue inhaler as needed.  Pulmonary hypertension Pulmonary hypertension with well-managed blood pressure. - Continue BD regimen for COPD and supplemental oxygen  Heart failure - Continue spironolactone as prescribed. - Use furosemide as needed for fluid management.  Sleep apnea Current issue is chronic respiratory failure with hypoxia rather than obstructive sleep apnea.     Glenford Bayley, NP 08/09/2023

## 2023-08-09 NOTE — Patient Instructions (Signed)
 -  CHRONIC RESPIRATORY FAILURE WITH HYPOXIA: Chronic respiratory failure with hypoxia means your body doesn't get enough oxygen, especially at night. We will order an overnight oximetry test to monitor your oxygen levels while you sleep to see if any adjustments are needed. Continue using your oxygen at 2 liters per night.  -CHRONIC OBSTRUCTIVE PULMONARY DISEASE (COPD) WITH ASTHMA: COPD with an asthmatic component means your airways are inflamed and narrowed, making it hard to breathe. Continue using your Breztri inhaler twice daily and your rescue inhaler as needed for sudden symptoms.  -PULMONARY HYPERTENSION: Pulmonary hypertension is high blood pressure in the arteries of your lungs. Your blood pressure is well-managed, so no changes are needed at this time.  -HEART FAILURE: Heart failure means your heart doesn't pump blood as well as it should, which can cause fatigue. Continue taking spironolactone as prescribed and use furosemide if you notice any fluid buildup.   INSTRUCTIONS: We will schedule an overnight oximetry test to monitor your oxygen levels while you sleep. Continue with your current medications and use your rescue inhaler as needed. Follow up with [place] if you notice any changes in your symptoms or if you have any concerns.  Orders: ONO on 2L oxygen  Follow-up: 4 month follow-up with Dr. Celine Mans -30 min visit (former Dr. Tonia Brooms patient)

## 2023-09-03 DIAGNOSIS — J449 Chronic obstructive pulmonary disease, unspecified: Secondary | ICD-10-CM | POA: Diagnosis not present

## 2023-09-06 DIAGNOSIS — I5022 Chronic systolic (congestive) heart failure: Secondary | ICD-10-CM | POA: Diagnosis not present

## 2023-09-06 DIAGNOSIS — E785 Hyperlipidemia, unspecified: Secondary | ICD-10-CM | POA: Diagnosis not present

## 2023-09-06 DIAGNOSIS — M8589 Other specified disorders of bone density and structure, multiple sites: Secondary | ICD-10-CM | POA: Diagnosis not present

## 2023-09-06 DIAGNOSIS — I10 Essential (primary) hypertension: Secondary | ICD-10-CM | POA: Diagnosis not present

## 2023-09-06 DIAGNOSIS — J449 Chronic obstructive pulmonary disease, unspecified: Secondary | ICD-10-CM | POA: Diagnosis not present

## 2023-09-14 DIAGNOSIS — R0902 Hypoxemia: Secondary | ICD-10-CM | POA: Diagnosis not present

## 2023-09-14 DIAGNOSIS — G4730 Sleep apnea, unspecified: Secondary | ICD-10-CM | POA: Diagnosis not present

## 2023-10-04 DIAGNOSIS — J449 Chronic obstructive pulmonary disease, unspecified: Secondary | ICD-10-CM | POA: Diagnosis not present

## 2023-10-08 DIAGNOSIS — J441 Chronic obstructive pulmonary disease with (acute) exacerbation: Secondary | ICD-10-CM | POA: Diagnosis not present

## 2023-10-08 DIAGNOSIS — J9611 Chronic respiratory failure with hypoxia: Secondary | ICD-10-CM | POA: Diagnosis not present

## 2023-10-11 ENCOUNTER — Ambulatory Visit: Admitting: Podiatry

## 2023-10-11 ENCOUNTER — Encounter: Payer: Self-pay | Admitting: Podiatry

## 2023-10-11 DIAGNOSIS — L84 Corns and callosities: Secondary | ICD-10-CM | POA: Diagnosis not present

## 2023-10-11 DIAGNOSIS — I739 Peripheral vascular disease, unspecified: Secondary | ICD-10-CM

## 2023-10-11 NOTE — Progress Notes (Unsigned)
 Subjective:  Patient ID: Joan Smith, female    DOB: Dec 22, 1940,  MRN: [id number]  Joan Smith presents to clinic today for:  Chief Complaint  Patient presents with   [facility]    [facility] with callous, bilaterally. Not diabetic, takes ASA 7  Patient presenting for at risk footcare.  She has multiple painful calluses on both feet causing her pain.  She denies history of type 2 diabetes.  She does have a history of peripheral arterial disease.  Painful calluses present left subsecond metatarsal head, right subthird metatarsal head and right fourth toe corn present.  She is deferring treatment of her nails today.  PCP is Adrian Hopper, MD.  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  Joan Smith is a pleasant 83 y.o. female in NAD. AAO x 3.  Vascular Examination: Patient has faintly palpable DP pulse, absent PT pulse bilateral.  3 to 5 seconds capillary refill bilateral toes.  Diminished digital hair bilateral.  Telangiectasias present.  Dermatological Examination: Interspaces are clear with no open lesions noted bilateral.  Skin is shiny and atrophic bilateral.  There are hyperkeratotic lesions noted left subsecond metatarsal head, right subthird metatarsal head as well as painful corn present to the dorsal fourth toe PIPJ.    Neurological Examination: Protective sensation intact b/l LE. Vibratory sensation intact b/l LE.  Musculoskeletal Examination: Muscle strength 5/5 to all LE muscle groups b/l.       No data to display           Patient qualifies for at-risk foot care because of PAD.  Assessment/Plan: 1. PAD (peripheral artery disease) (HCC)   2. Corns and callosities     No orders of the defined types were placed in this encounter.  None  Patient deferring treatment of mycotic nails today.  # Painful callus left subsecond metatarsal head, right subthird metatarsal head, right fourth toe PIPJ corn All symptomatic hyperkeratoses x 3 were safely debrided with a sterile #312 blade to patient's level of comfort without incident. We discussed preventative and palliative care of these lesions including supportive and accommodative shoegear, padding, prefabricated and custom molded accommodative orthoses, use of a pumice stone and lotions/creams daily.  -Patient qualifies for at risk foot care due to decreased pedal pulses.   Return in about 3 months (around 01/11/2024) for Routine Foot Care.   Joan Smith, AACFAS [facility]     [address], [place] [address]  Office [phone]  Fax [phone]

## 2023-10-11 NOTE — Patient Instructions (Signed)
 More silicone and felt pads can be purchased from:  [email or web address]  Recommend looking for metatarsal pads, wear shoe offloading pads for the calluses on the bottom of your feet.  Can also look for toe corn pads or silicone gel toe caps to protect the tops of the toes with shoe gear.  You can also find these on [employer], [place], [email or web address], [employer].com  Look for urea 40% cream or ointment and apply to the thickened dry skin / calluses. This can be bought over the counter, at a pharmacy or online such as [employer].  You can also scrub these calluses with white vinegar, this will make it easier for you to file them down using a pumice stone or emery board.

## 2023-10-13 ENCOUNTER — Ambulatory Visit (HOSPITAL_BASED_OUTPATIENT_CLINIC_OR_DEPARTMENT_OTHER): Admitting: Nurse Practitioner

## 2023-10-13 ENCOUNTER — Encounter (HOSPITAL_BASED_OUTPATIENT_CLINIC_OR_DEPARTMENT_OTHER): Payer: Self-pay | Admitting: Nurse Practitioner

## 2023-10-13 ENCOUNTER — Ambulatory Visit (HOSPITAL_BASED_OUTPATIENT_CLINIC_OR_DEPARTMENT_OTHER)

## 2023-10-13 VITALS — BP 154/78 | HR 65 | Ht 64.0 in | Wt 139.0 lb

## 2023-10-13 DIAGNOSIS — I700 Atherosclerosis of aorta: Secondary | ICD-10-CM | POA: Diagnosis not present

## 2023-10-13 DIAGNOSIS — J9611 Chronic respiratory failure with hypoxia: Secondary | ICD-10-CM | POA: Diagnosis not present

## 2023-10-13 DIAGNOSIS — I517 Cardiomegaly: Secondary | ICD-10-CM | POA: Diagnosis not present

## 2023-10-13 DIAGNOSIS — J441 Chronic obstructive pulmonary disease with (acute) exacerbation: Secondary | ICD-10-CM | POA: Diagnosis not present

## 2023-10-13 DIAGNOSIS — Z87891 Personal history of nicotine dependence: Secondary | ICD-10-CM | POA: Diagnosis not present

## 2023-10-13 DIAGNOSIS — I5042 Chronic combined systolic (congestive) and diastolic (congestive) heart failure: Secondary | ICD-10-CM | POA: Diagnosis not present

## 2023-10-13 DIAGNOSIS — R053 Chronic cough: Secondary | ICD-10-CM

## 2023-10-13 DIAGNOSIS — J449 Chronic obstructive pulmonary disease, unspecified: Secondary | ICD-10-CM | POA: Diagnosis not present

## 2023-10-13 MED ORDER — ALBUTEROL SULFATE (2.5 MG/3ML) 0.083% IN NEBU: 2.5000 mg | INHALATION_SOLUTION | Freq: Four times a day (QID) | RESPIRATORY_TRACT | 12 refills | Status: DC | PRN

## 2023-10-13 MED ORDER — DOXYCYCLINE HYCLATE 100 MG PO TABS: 100.0000 mg | ORAL_TABLET | Freq: Two times a day (BID) | ORAL | 0 refills | Status: DC

## 2023-10-13 MED ORDER — METHYLPREDNISOLONE ACETATE 80 MG/ML IJ SUSP
80.0000 mg | Freq: Once | INTRAMUSCULAR | Status: AC
Start: 2023-10-13 — End: 2023-10-13
  Administered 2023-10-13: 80 mg via INTRAMUSCULAR

## 2023-10-13 MED ORDER — ALBUTEROL SULFATE (2.5 MG/3ML) 0.083% IN NEBU
2.5000 mg | INHALATION_SOLUTION | RESPIRATORY_TRACT | Status: AC
Start: 2023-10-13 — End: ?
  Administered 2023-10-13: 2.5 mg via RESPIRATORY_TRACT

## 2023-10-13 MED ORDER — PREDNISONE 10 MG PO TABS: ORAL_TABLET | ORAL | 0 refills | Status: DC

## 2023-10-13 NOTE — Patient Instructions (Addendum)
-  Continue Breztri  2 puffs Twice daily, use with spacer. Brush tongue and rinse mouth afterwards  -Continue cetirizine daily  -Continue xopenex 1-2 puffs or albuterol neb every 6 hours as needed for shortness of breath or wheezing -Continue supplemental oxygen  2 lpm at night. -Continue Benzonatate  1 capsule Three times a day as needed for cough -Continue Ipratropium nasal spray 2 sprays each nostril 2-3 times a day -Continue Famotidine  20 mg Twice daily for reflux  Doxycycline  1 tab Twice daily for 7 days. Take with food. Wear sunscreen outside as this can increase risk for sunburns Mucinex DM or regular mucinex Twice daily over the counter for cough/congestion Continue prednisone  20 mg for 5 days then 10 mg for 5 days then stop. Take in AM with food  Chest x ray today    Follow up in one week with Joan Adjoa Althouse,NP. Use blocked slot at 11 am on 6/18. If symptoms do not improve or worsen, please contact office for sooner follow up or seek emergency care.

## 2023-10-13 NOTE — Progress Notes (Signed)
 @Patient  ID: Joan Smith, female    DOB: Oct 19, 1940, 83 y.o.   MRN: [id number]  Chief Complaint  Patient presents with   Acute Visit    Cough , productive x 2 weeks    Referring provider: Adrian Hopper, MD  HPI: 83 year old female, former smoker (75 pack years) followed for COPD/asthma overlap. She is a former patient of Dr. Glenis Langdon and last seen in office on 08/09/2023 by Sueanne Emerald NP. Past medical history significant for SVT, CAD, HTN, PH, restrictive cardiomyopathy, CHF. She is followed by cardiology.   TEST/EVENTS:  10/25/2020 echocardiogram: EF 40 to 45%.  G1 DD.  Normal PASP.  LA severely dilated.  Trivial MVR.  08/09/2023: OV with Walsh,NP.  Uses oxygen  at night, 2 L/min.  Supposed to adjust 3 L/min but she was unaware of this.  No significant respiratory issues.  Has COPD with asthmatic component.  Using Breztri  twice daily.  Had a prolonged cough during the winter but has since resolved.  Has heart failure, which contributes to her fatigue.  Blood pressure readings have been good.  Take spironolactone , half tablet and has furosemide  available as needed although has not renewed it recently. ONO on 2 L/min ordered.   10/13/2023: Today-acute Discussed the use of AI scribe software for clinical note transcription with the patient, who gave verbal consent to proceed.  History of Present Illness   Joan Smith is an 83 year old female with chronic lung disease who presents with worsening respiratory symptoms. She is accompanied by her son.  She has been experiencing difficulties with breathing and cough for about two weeks, with worsening respiratory symptoms since Saturday. She is on a tapering dose of prednisone  prescribed by her PCP last week, currently at 20 mg, and has not been on antibiotics recently.  She reports coughing up yellow and occasionally green sputum, but no hemoptysis. No fever is present. Her rescue inhaler is used twice a day at most, although it was not  needed for a long time before this illness. Her son feels she is more short winded and sounds congested. She denies any leg swelling, CP.   She has a history of congestive heart failure and is on spironolactone . Weights have been stable.  She is allergic to amoxicillin, which causes diarrhea, and has a noted reaction to cefaclor. She has previously tolerated doxycycline .  She is planning to fly to [place]  on June 22nd and is concerned about her respiratory status for the trip. She does use supplemental oxygen  2 lpm at night. ONO from April is not in her chart; unsure if this was completed.  Sinus symptoms are well managed on current nasal srpay regimen.       Allergies  Allergen Reactions   Amoxicillin Diarrhea and Nausea And Vomiting   Ceclor [Cefaclor] Hives and Rash    Immunization History  Administered Date(s) Administered   Fluad Quad(high Dose 65+) 05/31/2020, 01/03/2023   Influenza, Seasonal, Injecte, Preservative Fre 02/14/2012   PFIZER(Purple Top)SARS-COV-2 Vaccination 07/30/2019, 08/22/2019    Past Medical History:  Diagnosis Date   Asthma    Broken heart syndrome 2013   Carotid arterial disease (HCC)    CHF (congestive heart failure) (HCC)    COPD (chronic obstructive pulmonary disease) (HCC)    Hypertension    OSA (obstructive sleep apnea) 2014   Paroxysmal SVT (supraventricular tachycardia) (HCC)    Pulmonary fibrosis (HCC)    Pulmonary hypertension (HCC) 10/27/2019   PVC (premature ventricular contraction)    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     benzonatate  (TESSALON ) 200 MG capsule Take 1 capsule (200 mg total) by mouth 3 (three) times daily as needed for cough. 30 capsule 2   bisoprolol  (ZEBETA ) 10 MG tablet Take 1 tablet (10 mg total) by mouth daily. 30 tablet 0   BREZTRI  AEROSPHERE 160-9-4.8 MCG/ACT AERO INHALE 2 PUFFS INTO THE LUNGS IN THE MORNING AND AT BEDTIME 10.7 g 3   cetirizine (ZYRTEC) 10 MG tablet Take 10 mg by mouth daily.     ipratropium (ATROVENT ) 0.03 % nasal spray Place 2 sprays into both nostrils 3 (three) times daily. 30 mL 5   levalbuterol (XOPENEX HFA) 45 MCG/ACT inhaler Inhale 2 puffs into the lungs as needed for wheezing or shortness of breath.     magnesium oxide (MAG-OX) 400 MG tablet Take 400 mg by mouth daily.     NON FORMULARY RELAXIUM as needed for sleep     rosuvastatin (CRESTOR) 20 MG tablet Take 1 tablet by mouth at bedtime.     Spacer/Aero-Holding Ismael Maria Use with inhaler 1 each 2   spironolactone  (ALDACTONE ) 25 MG tablet Take 0.5 tablets (12.5 mg total) by mouth daily. 45 tablet 3   furosemide  (LASIX ) 20 MG tablet Take 1 tablet (20 mg total) by mouth as needed. 30 tablet 1   No facility-administered medications prior to visit.     Review of Systems:   Constitutional: No weight loss or gain, night sweats, fevers, chills, fatigue, or lassitude. HEENT: No headaches, difficulty swallowing, tooth/dental problems, or sore throat. No sneezing, itching, ear ache, nasal congestion, rhinorrhea, postnasal drip  CV:   No chest pain, orthopnea, PND, palpitations, swelling in lower extremities, anasarca, dizziness,  syncope Resp: +shortness of breath with exertion; productive cough; wheezing. No hemoptysis. No chest wall deformity GI:  No heartburn, indigestion, abdominal pain, nausea, vomiting, diarrhea, change in bowel habits, loss of appetite, bloody stools.  GU: No dysuria, change in color of urine, urgency  or frequency.  Skin: No rash, lesions, ulcerations MSK:  No joint pain or swelling.  Neuro: No dizziness or lightheadedness.  Psych: No depression or anxiety. Mood stable.     Physical Exam:  BP (!) 154/78   Pulse 65   Ht 5' 4 (1.626 m)   Wt 139 lb (63 kg)   SpO2 93%   BMI 23.86 kg/m   GEN: Pleasant, interactive, well-nourished; in no acute distress HEENT:  Normocephalic and atraumatic. PERRLA. Sclera white. Nasal turbinates pale, moist and patent bilaterally. No rhinorrhea present. Oropharynx pink and moist, without exudate or edema. No lesions, ulcerations, or postnasal drip.  NECK:  Supple w/ fair ROM. No JVD present. Thyroid  symmetrical with no goiter or nodules palpated. No lymphadenopathy.   CV: RRR, no m/r/g, no peripheral edema. Pulses intact, +2 bilaterally. No cyanosis, pallor or clubbing. PULMONARY:  Unlabored, regular breathing. Scattered wheezes and rhonchi bilaterally A&P. LLL with pleural rub. No accessory muscle use. GI: BS present  and normoactive. Soft, non-tender to palpation. No organomegaly or masses detected.  MSK: No erythema, warmth or tenderness. Cap refil <2 sec all extrem. No deformities or joint swelling noted.  Neuro: A/Ox3. No focal deficits noted.   Skin: Warm, no lesions or rashe Psych: Normal affect and behavior. Judgement and thought content appropriate.     Lab Results:  CBC    Component Value Date/Time   WBC 10.9 (H) 05/11/2023 0941   RBC 4.61 05/11/2023 0941   HGB 13.8 05/11/2023 0941   HGB 14.2 05/21/2021 1015   HCT 42.4 05/11/2023 0941   HCT 43.2 05/21/2021 1015   PLT 193.0 05/11/2023 0941   PLT 239 05/21/2021 1015   MCV 91.9 05/11/2023 0941   MCV 92 05/21/2021 1015   MCH 29.5 10/09/2022 1255   MCHC 32.7 05/11/2023 0941   RDW 14.4 05/11/2023 0941   RDW 12.9 05/21/2021 1015   LYMPHSABS 2.2 05/11/2023 0941   LYMPHSABS 2.0 11/05/2020 1330   MONOABS 1.0 05/11/2023 0941   EOSABS 0.1 05/11/2023 0941   EOSABS 0.1 11/05/2020 1330    BASOSABS 0.0 05/11/2023 0941   BASOSABS 0.0 11/05/2020 1330    BMET    Component Value Date/Time   NA 137 05/11/2023 0941   NA 141 11/25/2020 1421   K 4.8 05/11/2023 0941   CL 102 05/11/2023 0941   CO2 28 05/11/2023 0941   GLUCOSE 93 05/11/2023 0941   BUN 40 (H) 05/11/2023 0941   BUN 24 11/25/2020 1421   CREATININE 1.01 05/11/2023 0941   CALCIUM 9.2 05/11/2023 0941   GFRNONAA 47 (L) 10/09/2022 1255    BNP No results found for: BNP   Imaging:  DG Chest 2 View Result Date: 10/13/2023 CLINICAL DATA:  Acute exacerbation of COPD.  Concern for pneumonia. EXAM: CHEST - 2 VIEW COMPARISON:  05/11/2023 FINDINGS: Heart is mildly enlarged. Mediastinal contours within normal limits. Aortic atherosclerosis. There is hyperinflation of the lungs compatible with COPD. Scarring in the right upper lobe and left lower lung, unchanged. No definite acute confluent airspace opacities or effusions. No acute bony abnormality. IMPRESSION: COPD/chronic changes.  No definite acute process. Electronically Signed   By: Janeece Mechanic M.D.   On: 10/13/2023 17:14    albuterol (PROVENTIL) (2.5 MG/3ML) 0.083% nebulizer solution 2.5 mg     Date Action Dose Route User   10/13/2023 1612 Given 2.5 mg Nebulization Williams, Jamie L, CMA      methylPREDNISolone acetate (DEPO-MEDROL) injection 80 mg     Date Action Dose Route User   10/13/2023 1614 Given 80 mg Intramuscular (Left Quadriceps) Lucious Sabina L, CMA           No data to display          No results found for: NITRICOXIDE      Assessment & Plan:   Acute exacerbation of COPD with asthma (HCC) AECOPD/asthma with acute worsening over the last several days. Lung exam concerning for pneumonia of LLL. Will obtain CXR. Allergies to PCN and cephalosporins. Will treat her with empiric doxycycline  course and extend her prednisone  taper. Mucociliary clearance therapies prescribed. Strict ED precautions and close follow up. Continue asthma/COPD  regimen. Action plan in place.  Albuterol neb and depo 80 mg inj x 1 administered in office. Rx for neb machine and solution.  Patient Instructions  -Continue Breztri  2 puffs Twice daily, use with spacer. Brush tongue and rinse mouth afterwards  -Continue cetirizine daily  -Continue xopenex 1-2 puffs or albuterol neb every 6 hours  as needed for shortness of breath or wheezing -Continue supplemental oxygen  2 lpm at night. -Continue Benzonatate  1 capsule Three times a day as needed for cough -Continue Ipratropium nasal spray 2 sprays each nostril 2-3 times a day -Continue Famotidine  20 mg Twice daily for reflux  Doxycycline  1 tab Twice daily for 7 days. Take with food. Wear sunscreen outside as this can increase risk for sunburns Mucinex DM or regular mucinex Twice daily over the counter for cough/congestion Continue prednisone  20 mg for 5 days then 10 mg for 5 days then stop. Take in AM with food  Chest x ray today    Follow up in one week with Katie Harbor Paster,NP. Use blocked slot at 11 am on 6/18. If symptoms do not improve or worsen, please contact office for sooner follow up or seek emergency care.    Chronic respiratory failure with hypoxia (HCC) No exertional hypoxia. Stable on room air today. Encouraged to monitor at home for goal >88-90%. Continue supplemental O2 2 lpm at night.   CHF (congestive heart failure) (HCC) Euvolemic on exam. Continue diuretic regimen     Roetta Clarke, NP 10/14/2023  Pt aware and understands NP's role.

## 2023-10-14 ENCOUNTER — Telehealth: Payer: Self-pay

## 2023-10-14 ENCOUNTER — Telehealth: Payer: Self-pay | Admitting: *Deleted

## 2023-10-14 ENCOUNTER — Encounter (HOSPITAL_BASED_OUTPATIENT_CLINIC_OR_DEPARTMENT_OTHER): Payer: Self-pay | Admitting: Nurse Practitioner

## 2023-10-14 NOTE — Assessment & Plan Note (Signed)
 Euvolemic on exam. Continue diuretic regimen

## 2023-10-14 NOTE — Assessment & Plan Note (Signed)
 AECOPD/asthma with acute worsening over the last several days. Lung exam concerning for pneumonia of LLL. Will obtain CXR. Allergies to PCN and cephalosporins. Will treat her with empiric doxycycline  course and extend her prednisone  taper. Mucociliary clearance therapies prescribed. Strict ED precautions and close follow up. Continue asthma/COPD regimen. Action plan in place.  Albuterol neb and depo 80 mg inj x 1 administered in office. Rx for neb machine and solution.  Patient Instructions  -Continue Breztri  2 puffs Twice daily, use with spacer. Brush tongue and rinse mouth afterwards  -Continue cetirizine daily  -Continue xopenex 1-2 puffs or albuterol neb every 6 hours as needed for shortness of breath or wheezing -Continue supplemental oxygen  2 lpm at night. -Continue Benzonatate  1 capsule Three times a day as needed for cough -Continue Ipratropium nasal spray 2 sprays each nostril 2-3 times a day -Continue Famotidine  20 mg Twice daily for reflux  Doxycycline  1 tab Twice daily for 7 days. Take with food. Wear sunscreen outside as this can increase risk for sunburns Mucinex DM or regular mucinex Twice daily over the counter for cough/congestion Continue prednisone  20 mg for 5 days then 10 mg for 5 days then stop. Take in AM with food  Chest x ray today    Follow up in one week with Joan Deandra Gadson,NP. Use blocked slot at 11 am on 6/18. If symptoms do not improve or worsen, please contact office for sooner follow up or seek emergency care.

## 2023-10-14 NOTE — Telephone Encounter (Signed)
 Copied from CRM [id number]. Topic: Clinical - Medical Advice >> Oct 12, 2023  3:44 PM Tyronne Galloway wrote: Reason for CRM: Pt's son Ambrosio Junker is requesting advice on whether or not his mother is fit to fly on 10/24/2023 due to being on oxygen . Pt has an [employer] O2 concentrator and would be flying to [place]  on 10/24/2023. Please call Ambrosio Junker back with any questions or concerns at [phone] ok to leave a vm. >> Oct 14, 2023 11:39 AM Crist Dominion wrote: Patients son Ambrosio Junker wanted to update Tanya Fantasia that he is going to buy a nebulizer for the patient when they get to [facility].   DUPLICATE

## 2023-10-14 NOTE — Assessment & Plan Note (Signed)
 No exertional hypoxia. Stable on room air today. Encouraged to monitor at home for goal >88-90%. Continue supplemental O2 2 lpm at night.

## 2023-10-14 NOTE — Telephone Encounter (Signed)
 Copied from CRM [id number]. Topic: Clinical - Medical Advice >> Oct 12, 2023  3:44 PM Tyronne Galloway wrote: Reason for CRM: Pt's son Ambrosio Junker is requesting advice on whether or not his mother is fit to fly on 10/24/2023 due to being on oxygen . Pt has an [employer] O2 concentrator and would be flying to [place]  on 10/24/2023. Please call Ambrosio Junker back with any questions or concerns at [phone] ok to leave a vm.  Spoke with Ambrosio Junker patient's son regarding prior message.Advised gary to contact the airlines to make sure it will be ok for patient to bring her POC on the plane. Loanne Rim our office can give him a letter also at patient's next visit on 10/20/2023. Ambrosio Junker also stated that he was able to get the nebulizer medication from the pharmacy but [facility] doesn't carry the nebulizer . Loanne Rim he will need to contact a local medical supply pharmacy and I can sent a script to them or patient can buy the nebulizer .  Will wait for Ambrosio Junker to contact the office back to let us  know what he wants to do . Will keep patient's chart open .

## 2023-10-15 ENCOUNTER — Ambulatory Visit: Payer: Self-pay | Admitting: Nurse Practitioner

## 2023-10-19 ENCOUNTER — Other Ambulatory Visit (HOSPITAL_COMMUNITY): Payer: Self-pay | Admitting: Cardiology

## 2023-10-19 DIAGNOSIS — I5022 Chronic systolic (congestive) heart failure: Secondary | ICD-10-CM

## 2023-10-19 DIAGNOSIS — S8011XA Contusion of right lower leg, initial encounter: Secondary | ICD-10-CM | POA: Diagnosis not present

## 2023-10-19 MED ORDER — BISOPROLOL FUMARATE 10 MG PO TABS: 10.0000 mg | ORAL_TABLET | Freq: Every day | ORAL | 3 refills | Status: DC

## 2023-10-20 ENCOUNTER — Encounter: Payer: Self-pay | Admitting: Nurse Practitioner

## 2023-10-20 ENCOUNTER — Ambulatory Visit: Admitting: Nurse Practitioner

## 2023-10-20 VITALS — BP 137/70 | HR 76 | Ht 63.0 in | Wt 138.0 lb

## 2023-10-20 DIAGNOSIS — Z961 Presence of intraocular lens: Secondary | ICD-10-CM | POA: Diagnosis not present

## 2023-10-20 DIAGNOSIS — H524 Presbyopia: Secondary | ICD-10-CM | POA: Diagnosis not present

## 2023-10-20 DIAGNOSIS — J441 Chronic obstructive pulmonary disease with (acute) exacerbation: Secondary | ICD-10-CM | POA: Diagnosis not present

## 2023-10-20 DIAGNOSIS — J9611 Chronic respiratory failure with hypoxia: Secondary | ICD-10-CM

## 2023-10-20 MED ORDER — DOXYCYCLINE HYCLATE 100 MG PO TABS: 100.0000 mg | ORAL_TABLET | Freq: Two times a day (BID) | ORAL | 0 refills | Status: DC

## 2023-10-20 MED ORDER — BREZTRI AEROSPHERE 160-9-4.8 MCG/ACT IN AERO: 2.0000 | INHALATION_SPRAY | Freq: Two times a day (BID) | RESPIRATORY_TRACT | 11 refills | Status: DC

## 2023-10-20 MED ORDER — PREDNISONE 10 MG PO TABS: ORAL_TABLET | ORAL | 0 refills | Status: DC

## 2023-10-20 NOTE — Assessment & Plan Note (Signed)
 Slow to resolve AECOPD. Clinically improving but still having some residual congestion and productive cough. She is preparing to go out of town to visit her daughter and son in law in [place] . Will extend her doxycycline  for 3 days to complete 10 day course. Extend prednisone  taper - rx resent today. Continue mucociliary clearance therapies and bronchodilator support. Action plan in place.  Patient Instructions  -Continue Breztri  2 puffs Twice daily, use with spacer. Brush tongue and rinse mouth afterwards  -Continue cetirizine daily  -Continue xopenex 1-2 puffs or albuterol  neb every 6 hours as needed for shortness of breath or wheezing. Use neb treatment 2-3 times a day and follow with flutter valve 10 times  -Continue supplemental oxygen  2 lpm at night. -Continue Benzonatate  1 capsule Three times a day as needed for cough -Continue Ipratropium nasal spray 2 sprays each nostril 2-3 times a day -Continue Famotidine  20 mg Twice daily for reflux   Extend Doxycycline  1 tab Twice daily for additional 3 days for total 10 days. Take with food. Wear sunscreen outside as this can increase risk for sunburns Mucinex DM or regular mucinex Twice daily over the counter for cough/congestion Prednisone  20 mg for 5 days then 10 mg for 5 days then stop. Take in AM with food    Follow up in 3 months with new pulmonologist or Joan Oralia Criger,NP. If symptoms do not improve or worsen, please contact office for sooner follow up or seek emergency care.

## 2023-10-20 NOTE — Patient Instructions (Addendum)
-  Continue Breztri  2 puffs Twice daily, use with spacer. Brush tongue and rinse mouth afterwards  -Continue cetirizine daily  -Continue xopenex 1-2 puffs or albuterol  neb every 6 hours as needed for shortness of breath or wheezing. Use neb treatment 2-3 times a day and follow with flutter valve 10 times  -Continue supplemental oxygen  2 lpm at night. -Continue Benzonatate  1 capsule Three times a day as needed for cough -Continue Ipratropium nasal spray 2 sprays each nostril 2-3 times a day -Continue Famotidine  20 mg Twice daily for reflux   Extend Doxycycline  1 tab Twice daily for additional 3 days for total 10 days. Take with food. Wear sunscreen outside as this can increase risk for sunburns Mucinex DM or regular mucinex Twice daily over the counter for cough/congestion Prednisone  20 mg for 5 days then 10 mg for 5 days then stop. Take in AM with food    Follow up in 3 months with new pulmonologist or Joan Alyzza Andringa,NP. If symptoms do not improve or worsen, please contact office for sooner follow up or seek emergency care.

## 2023-10-20 NOTE — Assessment & Plan Note (Signed)
 No exertional requirement. Continue supplemental O2 at night. Will follow up with [employer] on ONO order. Goal >88-90%

## 2023-10-20 NOTE — Progress Notes (Signed)
 @Patient  ID: Joan Smith, female    DOB: 10/23/1940, 82 y.o.   MRN: [id number]  Chief Complaint  Patient presents with   Follow-up    Breathing is ok     Referring provider: Adrian Hopper, MD  HPI: 83 year old female, former smoker (75 pack years) followed for COPD/asthma overlap. She is a former patient of Dr. Glenis Langdon and last seen in office on 10/13/2023 by Sueanne Emerald NP. Past medical history significant for SVT, CAD, HTN, PH, restrictive cardiomyopathy, CHF. She is followed by cardiology.   TEST/EVENTS:  10/25/2020 echocardiogram: EF 40 to 45%.  G1 DD.  Normal PASP.  LA severely dilated.  Trivial MVR. 10/13/2023 CXR: mildly enlarged. Atherosclerosis. Hyperinflation. Scarring of RUL and left lower lung, unchanged.   08/09/2023: OV with Walsh,NP.  Uses oxygen  at night, 2 L/min.  Supposed to adjust 3 L/min but she was unaware of this.  No significant respiratory issues.  Has COPD with asthmatic component.  Using Breztri  twice daily.  Had a prolonged cough during the winter but has since resolved.  Has heart failure, which contributes to her fatigue.  Blood pressure readings have been good.  Take spironolactone , half tablet and has furosemide  available as needed although has not renewed it recently. ONO on 2 L/min ordered.   10/13/2023: Ov with Letonya Mangels NP for acute visit Discussed the use of AI scribe software for clinical note transcription with the patient, who gave verbal consent to proceed. Joan Smith is an 83 year old female with chronic lung disease who presents with worsening respiratory symptoms. She is accompanied by her son. She has been experiencing difficulties with breathing and cough for about two weeks, with worsening respiratory symptoms since Saturday. She is on a tapering dose of prednisone  prescribed by her PCP last week, currently at 20 mg, and has not been on antibiotics recently. She reports coughing up yellow and occasionally green sputum, but no hemoptysis. No fever  is present. Her rescue inhaler is used twice a day at most, although it was not needed for a long time before this illness. Her son feels she is more short winded and sounds congested. She denies any leg swelling, CP.  She has a history of congestive heart failure and is on spironolactone . Weights have been stable. She is allergic to amoxicillin, which causes diarrhea, and has a noted reaction to cefaclor. She has previously tolerated doxycycline . She is planning to fly to [place]  on June 22nd and is concerned about her respiratory status for the trip. She does use supplemental oxygen  2 lpm at night. ONO from April is not in her chart; unsure if this was completed. Sinus symptoms are well managed on current nasal srpay regimen.     10/20/2023: Today - follow up Discussed the use of AI scribe software for clinical note transcription with the patient, who gave verbal consent to proceed.  History of Present Illness   Joan Smith is an 83 year old female who presents for follow up with her son. Treated for acute flare up at her last visit. There was concern for pneumonia based on exam but no consolidation on CXR.   She was treated with doxycyline for 7 days and was supposed to extend her prednisone  taper but didn't receive this from the pharmacy. She is feeling better. Still having congestion and wheezing, which have shown some improvement with current treatments. She uses a nebulizer. Not using mucinex. Her son says they forgot to get this. She performs breathing treatments  every six hours, which she finds beneficial. She also received a steroid shot last week, which she does feel like she was better after this than she is today. But overall, she is still improved. Cough has cleared up some. No fevers, chills, hemoptysis.   She is currently using oxygen  at night. 2 lpm. She did have an ONO on 2 lpm to ensure O2 was corrected but never heard back on the results.   She ran out of Breztri  last night,  which was cancelled by her pharmacy for some reason. No issues with her oxygen  levels during daily activities.       Allergies  Allergen Reactions   Amoxicillin Diarrhea and Nausea And Vomiting   Ceclor [Cefaclor] Hives and Rash    Immunization History  Administered Date(s) Administered   Fluad Quad(high Dose 65+) 05/31/2020, 01/03/2023   Influenza, Seasonal, Injecte, Preservative Fre 02/14/2012   PFIZER(Purple Top)SARS-COV-2 Vaccination 07/30/2019, 08/22/2019    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   Ventricular tachycardia (HCC) 10/27/2019    Tobacco History: Social History   Tobacco Use  Smoking Status Former   Current packs/day: 0.00   Average packs/day: 1.5 packs/day for 50.0 years (75.0 ttl pk-yrs)   Types: Cigarettes   Start date: 05/04/1949   Quit date: 05/05/1999   Years since quitting: 24.4  Smokeless Tobacco Never   Counseling given: Not Answered   Outpatient Medications Prior to Visit  Medication Sig Dispense Refill   albuterol  (PROVENTIL ) (2.5 MG/3ML) 0.083% nebulizer solution Take 3 mLs (2.5 mg total) by nebulization every 6 (six) hours as needed for wheezing or shortness of breath. 75 mL 12   aspirin 81 MG EC tablet Take 81 mg by mouth daily.     benzonatate  (TESSALON ) 200 MG capsule Take 1 capsule (200 mg total) by mouth 3 (three) times daily as needed for cough. 30 capsule 2   bisoprolol  (ZEBETA ) 10 MG tablet Take 1 tablet (10 mg total) by mouth daily. 30 tablet 3   cetirizine (ZYRTEC) 10 MG tablet Take 10 mg by mouth  daily.     furosemide  (LASIX ) 20 MG tablet Take 1 tablet (20 mg total) by mouth as needed. 30 tablet 1   ipratropium (ATROVENT ) 0.03 % nasal spray Place 2 sprays into both nostrils 3 (three) times daily. 30 mL 5   levalbuterol (XOPENEX HFA) 45 MCG/ACT inhaler Inhale 2 puffs into the lungs as needed for wheezing or shortness of breath.     magnesium oxide (MAG-OX) 400 MG tablet Take 400 mg by mouth daily.     NON FORMULARY RELAXIUM as needed for sleep     rosuvastatin (CRESTOR) 20 MG tablet Take 1 tablet by mouth at bedtime.     Spacer/Aero-Holding Ismael Maria Use with inhaler 1 each 2   spironolactone  (ALDACTONE ) 25 MG tablet Take 0.5 tablets (12.5 mg total) by mouth daily. 45 tablet 3   BREZTRI  AEROSPHERE 160-9-4.8 MCG/ACT AERO INHALE 2 PUFFS INTO THE LUNGS IN THE MORNING AND AT BEDTIME 10.7 g 3   doxycycline  (VIBRA -TABS) 100  MG tablet Take 1 tablet (100 mg total) by mouth 2 (two) times daily for 7 days. 14 tablet 0   predniSONE  (DELTASONE ) 10 MG tablet 2 tabs for 5 days, then 1 tab for 5 days, then stop 15 tablet 0   Facility-Administered Medications Prior to Visit  Medication Dose Route Frequency Provider Last Rate Last Admin   albuterol  (PROVENTIL ) (2.5 MG/3ML) 0.083% nebulizer solution 2.5 mg  2.5 mg Nebulization Q4H Author Hatlestad, Mariah Shines, NP   2.5 mg at 10/13/23 1612     Review of Systems:   Constitutional: No weight loss or gain, night sweats, fevers, chills, fatigue, or lassitude. HEENT: No headaches, difficulty swallowing, tooth/dental problems, or sore throat. No sneezing, itching, ear ache, nasal congestion, rhinorrhea, postnasal drip  CV:   No chest pain, orthopnea, PND, palpitations, swelling in lower extremities, anasarca, dizziness,  syncope Resp: +shortness of breath with exertion (improved); productive cough (improved). No wheezing. No hemoptysis. No chest wall deformity GI:  No heartburn, indigestion, abdominal pain, nausea, vomiting, diarrhea, change in bowel habits, loss of  appetite, bloody stools.  GU: No dysuria, change in color of urine, urgency or frequency.  Skin: No rash, lesions, ulcerations MSK:  No joint pain or swelling.  Neuro: No dizziness or lightheadedness.  Psych: No depression or anxiety. Mood stable.     Physical Exam:  BP 137/70 (BP Location: Right Arm, Patient Position: Sitting, Cuff Size: Small)   Pulse 76   Ht 5' 3 (1.6 m)   Wt 138 lb (62.6 kg)   SpO2 95%   BMI 24.45 kg/m   GEN: Pleasant, interactive, well-nourished; in no acute distress HEENT:  Normocephalic and atraumatic. PERRLA. Sclera white. Nasal turbinates pale, moist and patent bilaterally. No rhinorrhea present. Oropharynx pink and moist, without exudate or edema. No lesions, ulcerations, or postnasal drip.  NECK:  Supple w/ fair ROM. No JVD present. Thyroid  symmetrical with no goiter or nodules palpated. No lymphadenopathy.   CV: RRR, no m/r/g, no peripheral edema. Pulses intact, +2 bilaterally. No cyanosis, pallor or clubbing. PULMONARY:  Unlabored, regular breathing. Improved aeration with minimal rhonchi LLL otherwise clear b/l. No accessory muscle use. GI: BS present and normoactive. Soft, non-tender to palpation. No organomegaly or masses detected.  MSK: No erythema, warmth or tenderness. Cap refil <2 sec all extrem. No deformities or joint swelling noted.  Neuro: A/Ox3. No focal deficits noted.   Skin: Warm, no lesions or rashe Psych: Normal affect and behavior. Judgement and thought content appropriate.     Lab Results:  CBC    Component Value Date/Time   WBC 10.9 (H) 05/11/2023 0941   RBC 4.61 05/11/2023 0941   HGB 13.8 05/11/2023 0941   HGB 14.2 05/21/2021 1015   HCT 42.4 05/11/2023 0941   HCT 43.2 05/21/2021 1015   PLT 193.0 05/11/2023 0941   PLT 239 05/21/2021 1015   MCV 91.9 05/11/2023 0941   MCV 92 05/21/2021 1015   MCH 29.5 10/09/2022 1255   MCHC 32.7 05/11/2023 0941   RDW 14.4 05/11/2023 0941   RDW 12.9 05/21/2021 1015   LYMPHSABS 2.2  05/11/2023 0941   LYMPHSABS 2.0 11/05/2020 1330   MONOABS 1.0 05/11/2023 0941   EOSABS 0.1 05/11/2023 0941   EOSABS 0.1 11/05/2020 1330   BASOSABS 0.0 05/11/2023 0941   BASOSABS 0.0 11/05/2020 1330    BMET    Component Value Date/Time   NA 137 05/11/2023 0941   NA 141 11/25/2020 1421   K 4.8 05/11/2023 0941   CL 102  05/11/2023 0941   CO2 28 05/11/2023 0941   GLUCOSE 93 05/11/2023 0941   BUN 40 (H) 05/11/2023 0941   BUN 24 11/25/2020 1421   CREATININE 1.01 05/11/2023 0941   CALCIUM 9.2 05/11/2023 0941   GFRNONAA 47 (L) 10/09/2022 1255    BNP No results found for: BNP   Imaging:  DG Chest 2 View Result Date: 10/13/2023 CLINICAL DATA:  Acute exacerbation of COPD.  Concern for pneumonia. EXAM: CHEST - 2 VIEW COMPARISON:  05/11/2023 FINDINGS: Heart is mildly enlarged. Mediastinal contours within normal limits. Aortic atherosclerosis. There is hyperinflation of the lungs compatible with COPD. Scarring in the right upper lobe and left lower lung, unchanged. No definite acute confluent airspace opacities or effusions. No acute bony abnormality. IMPRESSION: COPD/chronic changes.  No definite acute process. Electronically Signed   By: Janeece Mechanic M.D.   On: 10/13/2023 17:14    albuterol  (PROVENTIL ) (2.5 MG/3ML) 0.083% nebulizer solution 2.5 mg     Date Action Dose Route User   10/13/2023 1612 Given 2.5 mg Nebulization Williams, Jamie L, CMA      methylPREDNISolone  acetate (DEPO-MEDROL ) injection 80 mg     Date Action Dose Route User   10/13/2023 1614 Given 80 mg Intramuscular (Left Quadriceps) Lucious Sabina L, CMA           No data to display          No results found for: NITRICOXIDE      Assessment & Plan:   Acute exacerbation of COPD with asthma (HCC) Slow to resolve AECOPD. Clinically improving but still having some residual congestion and productive cough. She is preparing to go out of town to visit her daughter and son in law in [place] . Will extend  her doxycycline  for 3 days to complete 10 day course. Extend prednisone  taper - rx resent today. Continue mucociliary clearance therapies and bronchodilator support. Action plan in place.  Patient Instructions  -Continue Breztri  2 puffs Twice daily, use with spacer. Brush tongue and rinse mouth afterwards  -Continue cetirizine daily  -Continue xopenex 1-2 puffs or albuterol  neb every 6 hours as needed for shortness of breath or wheezing. Use neb treatment 2-3 times a day and follow with flutter valve 10 times  -Continue supplemental oxygen  2 lpm at night. -Continue Benzonatate  1 capsule Three times a day as needed for cough -Continue Ipratropium nasal spray 2 sprays each nostril 2-3 times a day -Continue Famotidine  20 mg Twice daily for reflux   Extend Doxycycline  1 tab Twice daily for additional 3 days for total 10 days. Take with food. Wear sunscreen outside as this can increase risk for sunburns Mucinex DM or regular mucinex Twice daily over the counter for cough/congestion Prednisone  20 mg for 5 days then 10 mg for 5 days then stop. Take in AM with food    Follow up in 3 months with new pulmonologist or Katie Deeya Richeson,NP. If symptoms do not improve or worsen, please contact office for sooner follow up or seek emergency care.   Chronic respiratory failure with hypoxia (HCC) No exertional requirement. Continue supplemental O2 at night. Will follow up with [facility] on ONO order. Goal >88-90%      Roetta Clarke, NP 10/20/2023  Pt aware and understands NP's role.

## 2023-10-21 ENCOUNTER — Telehealth: Payer: Self-pay | Admitting: Nurse Practitioner

## 2023-10-21 DIAGNOSIS — J441 Chronic obstructive pulmonary disease with (acute) exacerbation: Secondary | ICD-10-CM

## 2023-10-22 ENCOUNTER — Ambulatory Visit: Payer: Self-pay

## 2023-10-22 NOTE — Telephone Encounter (Addendum)
 Spoke to patient's son regarding result of ONO. Patient's son also wanted  Rx order for O2 sent to [place] . Order placed, left patient a detailed message regarding this.

## 2023-10-22 NOTE — Telephone Encounter (Signed)
 Duplicate encounter, please see other encounter   Copied from CRM [id number]. Topic: Clinical - Order For Equipment >> Oct 22, 2023  2:33 PM Crist Dominion wrote: Reason for CRM: Patient son is requesting a order for portable oxygen  be sent to [facility] as soon as  possbile as the patient leaves for a flight on Sunday. Patients son does not care about the cost just needs to order to be faxed. Was advised that there was already an order sent to [facility] but denied to use that one.   [facility]:  [address] [place], [place] [address] Phone: [phone] Fax: [phone] >> Oct 22, 2023  2:46 PM Ilene Malling wrote: Patient's children Raeanne Bull [phone] is at [employer] [facility] trying to get oxygen  for patient to go out town to [place]  this Sunday. Raeanne Bull is with Royston Cornea the general manager at [employer] is asking for what does the office do to help with patient accommodating oxygen . Royston Cornea wants to know if an on called provider can addressed this matter. Please advise. Warm transferred to NT.

## 2023-10-22 NOTE — Telephone Encounter (Signed)
 Patient is in [place]  currently, family is trying to pick up O2, the staff there are requesting the prescription be re sent/ and or re-written.   Raeanne Bull (family member) is currently at the DME location. Please contact her at [phone].  Thank you Dr. Villa Greaser.

## 2023-10-27 ENCOUNTER — Telehealth: Payer: Self-pay

## 2023-10-27 NOTE — Telephone Encounter (Signed)
 Per Mazurika at [facility]-  Order was pulled from pt chart on 6/20 and faxed to the servicing [facility] branch in [place].NFN

## 2023-10-27 NOTE — Telephone Encounter (Signed)
 I have re sent this order to [facility].

## 2023-10-27 NOTE — Telephone Encounter (Signed)
 Copied from CRM [id number]. Topic: Clinical - Order For Equipment >> Sep 29, 2023  1:40 PM [name] wrote: Reason for CRM: Patient's son is calling in regarding patient. Requesting a prescription be sent prior to June 22nd for a trip to [facility] in [place], [place] . Requesting an oxygen  concentrator (level 2) for a one month usage while there.  Patient requesting a call to notify when the order is sent. >> Oct 15, 2023 12:58 PM [name] wrote: Resending due to missing encounter info.  The order was already placed on 10-22-23. Pcc's, was this taken care of?

## 2023-11-02 DIAGNOSIS — R399 Unspecified symptoms and signs involving the genitourinary system: Secondary | ICD-10-CM | POA: Diagnosis not present

## 2023-11-03 DIAGNOSIS — J449 Chronic obstructive pulmonary disease, unspecified: Secondary | ICD-10-CM | POA: Diagnosis not present

## 2023-11-08 ENCOUNTER — Ambulatory Visit: Payer: Self-pay | Admitting: Nurse Practitioner

## 2023-11-08 NOTE — Telephone Encounter (Signed)
 FYI Only or Action Required?: Action required by provider: refusing 911 at this time, requesting call back with further recommendations.  Patient is followed in Pulmonology for COPD and chronic respiratory failure, last seen on 10/20/2023 by Joan Comer GAILS, NP. Called Nurse Triage reporting low oxygen  levels, Altered Mental Status, Fatigue, Chest Pain, poor circulation to hands and feet, and current UTI. Symptoms began several weeks ago. Interventions attempted: Maintenance inhaler, Nebulizer treatments, Home oxygen  use, Increased fluids/rest, and Other: UC visit in [place] . Symptoms are: rapidly worsening.  Triage Disposition: Call EMS 911 Now  Patient/caregiver understands and will follow disposition?: No, refuses disposition     Copied from CRM [id number]. Topic: Clinical - Red Word Triage >> Nov 08, 2023 11:43 AM Joan Smith wrote: Red Word that prompted transfer to Nurse Triage: Patient's son is on line reporting severe diminished oxygen  capacity , lethargy, and altered mental state following trip to [place] . Aiming to schedule urgent appointment to have patient seen.  Patient to be flying back ASAP. Per agent pt been sleeping 16-18 hours per day, oxygen  in low 70s even at rest, went to UC, immediately put her on oxygen  and recommended she see pulm asap Reason for Disposition  Difficult to awaken or acting confused (e.g., disoriented, slurred speech)  Answer Assessment - Initial Assessment Questions E2C2 Pulmonary Triage - Initial Assessment Questions Chief Complaint (e.g., cough, sob, wheezing, fever, chills, sweat or additional symptoms) *Go to specific symptom protocol after initial questions. Other night was on oxygen , stopped to take albuterol  nebulizer and took breztri  then checked 88% pulse ox right after the treatments, for 4 years normally if sits down at all it's 94-95% Mental acuity shifted dramatically in the last month UC said need to follow up with pulm asap, also has a  UTI Spending 16-19 hours in bed each day even with oxygen , coming out of bed still today 76% Coming off of sleep been in 80s and sometimes 70s which is a big change Also going to call the heart doctor She grabbed her chest for a tightness, feels like her heart stops beating sometimes Pt in [place]  currently, cutting trip short to get home Thursday, went to UC in meantime, when in UC the lady when went to check pulses in legs, how long feet been bluish, son noticed about a month ago when was getting a pedicure, sometimes her fingers where can't separate her fingers, has to move her body around before she's able to move them Fingers are always cold and bluish even after glove and rubbing her hands before pulse ox Still recognizes son, still cognitive son states  How long have symptoms been present? 2 weeks, had appt with Comer  Have you used your inhalers/maintenance medication? Yes If yes, What medications? Just started the nebulizer prescribed by Comer 3 weeks ago, she loves it, makes her feel better Also has levalbuterol rescue inhaler Breztri  Using nebulizer 2-3x/day Rescue inhaler in spurts not using at all, now using it  OXYGEN : Do you wear supplemental oxygen ? Yes If yes, How many liters are you supposed to use? 2L    Advised hospital right away, son will pass on recommendation to family Son reporting that she's survived the week, family thought best that pt see her docs at home, asking if could wait and see pt's own docs, advised not wait Advised hospital asap, call 911 if any worsening Sending message to pulm for call back with further recommendations  Protocols used: Oxygen  Monitoring and Hypoxia-A-AH

## 2023-11-09 NOTE — Telephone Encounter (Signed)
 Spoke with patient's son Joan Smith regarding prior message. Patient was advised to go to urgent care or to the hospital. Patient is in [place]  and patient will return on Thursday .[name] to contact our office when patient returns to be evaluated . Patient's son's voice was understanding.

## 2023-11-11 NOTE — Progress Notes (Signed)
 Advanced Heart Failure Clinic Note    PCP: Keren [name], MD  Primary Cardiologist: Formerly [facility]) - relocated to [place] 07/2020 HF Cardiologist: Dr. 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 woman (family friend of Adriana Free in cath lab). 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 Quit smoking > 40 years ago.   Her husband died in 2012/03/10. Shortly after that admitted with acute HF with 25 pounds of fluid.   Echo 2013 EF 35% mild to moderate TR. Cath 2013 no CAD Felt to be Tako-tsubo vs PVC-mediated  Holter 1/12 PVC burden 19.7% Holter 11/13 PVC burden 21% Holter 11/21 PVC burden 4% (no ablation or AAD)   Echo 2021 EF 55% moderate MR mild PAH G2DD  Previously managed at [facility] in [place]  - relocated to [place] 07/2020 to live with her son.   Initial visit 03/10/24, NYHA II and volume stable. cMRI ordered to evaluate cause of CM.  Echo 2024-03-10) EF 45-50% RV ok.  cMRI (1/23): difficult study, unable to calculate RVEF or LVEF; Noncoronary LGE pattern in the basal septum. The RV insertion sites with LGE can be nonspecific and suggest pressure/volume overload. However, the more extensive mid-wall basal septal LGE may be indicative of prior myocarditis.  Follow up 12/23 with Dr. Cherrie, she fatigued easily, and was having stable exertional chest tightness she attributed to anxiety. She decided to fogo further testing and would notify clinic if it worsened.  Follow up 6/24 acute visit for bradycardia. HR 32, told to hold beta blocker by PCP. ECG showed frequent PVCs.    Zio- 2 week (6/24): mostly NSR, 11 runs of NSVT, 54 runs of SVT, 8.9% PVC burden  Echo 11/24 EF 40-45%, global HK, RV ok.   Today she returns for AHF follow  up with her children. Overall feeling ok just very tired. Denies palpitations, CP, dizziness, edema, or PND/Orthopnea. Denies SOB as she is limited with her mobility. Appetite ok. No fever or chills. Weight at home 135-137 pounds. Taking all medications. Frequent naps. She lives alone and is independent for the most part, her son lives 500 ft away. Very forgetful on exam, apparently she is like this at baseline. Currently on Cirpo (third abx) for UTI. Still drives to the grocery store at times.   ROS: All systems reviewed and negative except as per HPI.   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 75 mL 12   aspirin  81 MG EC tablet Take 81 mg by mouth daily.     bisoprolol  (ZEBETA ) 10 MG tablet  Take 1 tablet (10 mg total) by mouth daily. 30 tablet 3   budesonide -glycopyrrolate -formoterol  (BREZTRI  AEROSPHERE) 160-9-4.8 MCG/ACT AERO inhaler Inhale 2 puffs into the lungs in the morning and at bedtime. 10.7 g 11   cetirizine (ZYRTEC) 10 MG tablet Take 10 mg by mouth daily.     ciprofloxacin  (CIPRO ) 500 MG tablet Take 1 tablet (500 mg total) by mouth 2 (two) times daily. 14 tablet 0   furosemide  (LASIX ) 20 MG tablet Take 1 tablet (20 mg total) by mouth as needed. 30 tablet 1   ipratropium  (ATROVENT ) 0.03 % nasal spray Place 2 sprays into both nostrils 3 (three) times daily. 30 mL 5   levalbuterol (XOPENEX HFA) 45 MCG/ACT inhaler Inhale 2 puffs into the lungs as needed for wheezing or shortness of breath.     NON FORMULARY RELAXIUM as needed for sleep     rosuvastatin  (CRESTOR ) 20 MG tablet Take 1 tablet by mouth at bedtime.     Spacer/Aero-Holding Raguel FRENCH Use with inhaler 1 each 2   spironolactone  (ALDACTONE ) 25 MG tablet Take 0.5 tablets (12.5 mg total) by mouth daily. 45 tablet 3   benzonatate  (TESSALON ) 200 MG capsule Take 1 capsule (200 mg total) by mouth 3 (three) times daily as needed for cough. (Patient not taking: Reported on 11/15/2023) 30 capsule 2   magnesium oxide (MAG-OX) 400 MG tablet Take 400 mg by mouth daily. (Patient not taking: Reported on 11/15/2023)     Current Facility-Administered Medications  Medication Dose Route Frequency Provider Last Rate Last Admin   albuterol  (PROVENTIL ) (2.5 MG/3ML) 0.083% nebulizer solution 2.5 mg  2.5 mg Nebulization Q4H Cobb, Comer GAILS, NP   2.5 mg at 10/13/23 1612   Allergies  Allergen Reactions   Amoxicillin Diarrhea and Nausea And Vomiting   Ceclor [Cefaclor] Hives and Rash   Social History   Socioeconomic History   Marital status: Widowed    Spouse name: Not on file   Number of children: Not on file   Years of education: Not on file   Highest education level: Not on file  Occupational History   Not on file  Tobacco Use   Smoking status: Former    Current packs/day: 0.00    Average packs/day: 1.5 packs/day for 50.0 years (75.0 ttl pk-yrs)    Types: Cigarettes    Start date: 05/04/1949    Quit date: 05/05/1999    Years since quitting: 24.5   Smokeless tobacco: Never  Substance and Sexual Activity   Alcohol use: Never   Drug use: Never   Sexual activity: Not on file  Other Topics Concern   Not on file  Social History Narrative   Not on file   Social Drivers of Health   Financial Resource Strain: Not  on file  Food Insecurity: Not on file  Transportation Needs: No Transportation Needs (03/03/2023)   PRAPARE - Administrator, Civil Service (Medical): No    Lack of Transportation (Non-Medical): No  Physical Activity: Not on file  Stress: Not on file  Social Connections: Not on file  Intimate Partner Violence: Not on file   Family History  Problem Relation Age of Onset   COPD Mother    Diabetes Mellitus II Mother    CAD Mother    Heart failure Mother    CAD Father    Heart attack Father    CAD Sister    Cardiomyopathy Brother    BP 118/60   Pulse 66   Ht  5' 3 (1.6 m)   Wt 62.5 kg (137 lb 12.8 oz)   SpO2 96%   BMI 24.41 kg/m   Wt Readings from Last 3 Encounters:  11/15/23 62.5 kg (137 lb 12.8 oz)  10/20/23 62.6 kg (138 lb)  10/13/23 63 kg (139 lb)   PHYSICAL EXAM: General:  elderly appearing.  No respiratory difficulty Neck: supple. JVD flat.  Cor: PMI nondisplaced. Regular rate & rhythm. No rubs, gallops or murmurs. Lungs: clear, diminished bases Extremities: no cyanosis, clubbing, rash, edema. Chronic bilateral leg wounds Neuro: alert & oriented x 3. Moves all 4 extremities w/o difficulty. Affect pleasant.   ReDs reading: 19 %, abnormal   ECG  NSR 60s (Personally reviewed)    ASSESSMENT & PLAN: 1. Chronic Systolic HF, with mildly reduced EF - Initial event certainly sounds suspicious for TakoTsubo following the death of her husband in 11-26-11, but subsequently may have had a component of PVC-related CM - Echo 2011/11/26 EF 35% mild to moderate TR.  - Cath 11-26-2011 no CAD Felt to be Tako-tsubo vs PVC-mediated - Holter 1/12 PVC burden 19.7%. Holter 11/13 PVC burden 21%. Holter 11/21 PVC burden 4% - Echo 2019-11-26 EF 55% moderate MR mild PAH G2DD - Echo 6/22 EF 45-50% RV ok - cMRI (1/23) unable to calculate LVEF or RVEF, extensive mid-wall basal septal LGE may be indicative of prior myocarditis.  - Echo 11/24 EF 40-45%, RV normal  - Stable NYHA Class II, limited more  by OA  - Hypovolemic on exam. Continue Lasix  20 mg PRN, have not used in a while.  - Hold spiro 12.5 mg for 4 days and liberalize fluids. Restart after 4 days.  - Continue bisoprolol  10 mg daily. - BP too soft for ABR/ ARNi  - No SGLT2i given age and risk for volume depletion. Recent UTI - Labs today  - update echo    2. Frequent PVCs - Holters as above. Burden much improved. Had been as high as 21%, most recent assessment PVC burden was 8%.  - Continue beta blocker, bisoprolol  10 mg   3. CAD - CT 11-26-2019 minimal coronary calcifications - denies CP   4. Carotid artery stenosis - Nonobstructive. Asymptomatic - continue ASA + statin   5. COPD with chronic hypoxic respiratory failure, on home O2 PRN  - Followed by Pulmonary  - stable today on RA, no wheezing on exam   Briefly discussed GOC with family and patient. They want to push independence but understand that it is unsafe long term with constant forgetfulness. If mental status does not improve at follow up would recommend SNF placement (discussed today). Plan for [facility] RN/PT.   Follow up in 4-6 weeks with APP to check fluid status.   Beckey LITTIE Coe, NP  12:53 PM

## 2023-11-12 ENCOUNTER — Telehealth: Payer: Self-pay | Admitting: Primary Care

## 2023-11-12 ENCOUNTER — Telehealth: Admitting: Primary Care

## 2023-11-12 ENCOUNTER — Ambulatory Visit: Admitting: Primary Care

## 2023-11-12 DIAGNOSIS — R071 Chest pain on breathing: Secondary | ICD-10-CM

## 2023-11-12 DIAGNOSIS — R0902 Hypoxemia: Secondary | ICD-10-CM

## 2023-11-12 DIAGNOSIS — R053 Chronic cough: Secondary | ICD-10-CM

## 2023-11-12 DIAGNOSIS — J441 Chronic obstructive pulmonary disease with (acute) exacerbation: Secondary | ICD-10-CM | POA: Diagnosis not present

## 2023-11-12 DIAGNOSIS — R0602 Shortness of breath: Secondary | ICD-10-CM

## 2023-11-12 MED ORDER — ALBUTEROL SULFATE (2.5 MG/3ML) 0.083% IN NEBU: 2.5000 mg | INHALATION_SOLUTION | Freq: Four times a day (QID) | RESPIRATORY_TRACT | 12 refills | Status: DC | PRN

## 2023-11-12 MED ORDER — CIPROFLOXACIN HCL 500 MG PO TABS: 500.0000 mg | ORAL_TABLET | Freq: Two times a day (BID) | ORAL | 0 refills | Status: DC

## 2023-11-12 NOTE — Telephone Encounter (Signed)
 Need in office follow-up in 1-2 weeks with either Landry NP or Izetta

## 2023-11-12 NOTE — Progress Notes (Signed)
 Virtual Visit via Video Note  I connected with Joan Smith on 11/12/23 at  2:30 PM EDT by a video enabled telemedicine application and verified that I am speaking with the correct person using two identifiers.  Location: Patient: Home Provider: Office    I discussed the limitations of evaluation and management by telemedicine and the availability of in person appointments. The patient expressed understanding and agreed to proceed.  History of Present Illness: 83 year old female, former smoker (75 pack years) followed for COPD/asthma overlap. She is a former patient of Dr. Harriet and last seen in office on 10/13/2023 by Hope NP. Past medical history significant for SVT, CAD, HTN, PH, restrictive cardiomyopathy, CHF. She is followed by cardiology.   TEST/EVENTS:  10/25/2020 echocardiogram: EF 40 to 45%.  G1 DD.  Normal PASP.  LA severely dilated.  Trivial MVR. 10/13/2023 CXR: mildly enlarged. Atherosclerosis. Hyperinflation. Scarring of RUL and left lower lung, unchanged.   11/12/2023- Interim hx  Discussed the use of AI scribe software for clinical note transcription with the patient, who gave verbal consent to proceed.  History of Present Illness   The patient, with COPD, presents with persistent cough and low oxygen  levels. She is accompanied by Rock, her caregiver.  Patient follows with out office for history of asthma with COPD and chronic respiratory failure, last seen on June 18th for AECOPD and prescribed Doxycyline and prednisone . Maintained on Breztri . Called triage on 11/08/23 reporting low oxygen  levels, altered mental status, fatigue and chest pain. Symptoms began several weeks ago. She uses home oxygen . UC visit in [place] , treated for UTI. Refused EMS.   She has been experiencing a persistent loose and congested cough that has not improved with doxycycline  treatment. Tessalon  Perles has been used for the cough, but it has not been effective.  She has a history of low oxygen   levels, which were noted during a recent trip to [place] . She typically uses oxygen  at night but has not been using it during the day. Due to her current symptoms, she has increased her oxygen  use.  She recently completed a course of doxycycline  and prednisone , prescribed on June 18th, but her symptoms have persisted. She also completed an antibiotic course for a urinary tract infection, which initially presented with altered mental status and fatigue. Her mental status has improved since completing the antibiotic.  A chest x-ray was performed in June, which showed hyperinflation of the lungs and scarring in the right upper and left lower lung that were unchanged from prior imaging.  Her current medications include aspirin , Breztri  inhaler, Zyrtec, Lasix , spironolactone , a rescue inhaler (levalbuterol), Crestor , and a nasal spray. She has completed her course of prednisone  and doxycycline .  Observations/Objective:  Patient appears well sitting comfortably at rest without oxygen . No respiratory distress. She is alert and oriented. Able to speak in full sentences and breathing comfortably.   Assessment and Plan:  Assessment and Plan    Acute exacerbation of chronic bronchitis Acute exacerbation of chronic bronchitis with persistent cough and respiratory symptoms. Associated pleuritic chest pain. Symptoms have not fully resolved with doxycycline .  CTA scan recommended to rule out pneumonia or PE. - Prescribe ciprofloxacin  for 7 days, twice daily, to cover both respiratory and urinary tract infections. - Advise to monitor for tendonitis symptoms while on ciprofloxacin  and discontinue if severe pain occurs. - Recommend wearing oxygen  24/7 until symptoms improve and further evaluation is possible. - Advise to use nebulizer and complete current course of prednisone .  Chronic obstructive  pulmonary disease (COPD) COPD with potential progression or acute exacerbation. Symptoms include persistent cough  and daytime oxygen  requirements.  - Order CT angiography of the chest to evaluate for acute changes or progression of COPD and rule out pulmonary embolism due to low oxygen  levels and chest pain. - Recommend wearing oxygen  24/7 until further evaluation. - Advise to use nebulizer and complete current course of prednisone .  Urinary tract infection Recent urinary tract infection treated with antibiotics. Symptoms of altered mental status and fatigue have improved, indicating response to treatment.  Recording duration: 19 minutes     Follow Up Instructions:  1-2 weeks with APP    I discussed the assessment and treatment plan with the patient. The patient was provided an opportunity to ask questions and all were answered. The patient agreed with the plan and demonstrated an understanding of the instructions.   The patient was advised to call back or seek an in-person evaluation if the symptoms worsen or if the condition fails to improve as anticipated.  I provided 35 minutes of non-face-to-face time during this encounter.   Almarie LELON Ferrari, NP

## 2023-11-15 ENCOUNTER — Encounter (HOSPITAL_COMMUNITY): Payer: Self-pay

## 2023-11-15 ENCOUNTER — Ambulatory Visit (HOSPITAL_COMMUNITY)
Admission: RE | Admit: 2023-11-15 | Discharge: 2023-11-15 | Disposition: A | Discharge status: Home / Self Care | Source: Ambulatory Visit | Attending: Internal Medicine | Admitting: Internal Medicine

## 2023-11-15 ENCOUNTER — Ambulatory Visit
Admission: RE | Admit: 2023-11-15 | Discharge: 2023-11-15 | Disposition: A | Discharge status: Home / Self Care | Source: Ambulatory Visit | Attending: Primary Care | Admitting: Primary Care

## 2023-11-15 VITALS — BP 118/60 | HR 66 | Ht 63.0 in | Wt 137.8 lb

## 2023-11-15 DIAGNOSIS — E861 Hypovolemia: Secondary | ICD-10-CM | POA: Diagnosis not present

## 2023-11-15 DIAGNOSIS — I493 Ventricular premature depolarization: Secondary | ICD-10-CM | POA: Insufficient documentation

## 2023-11-15 DIAGNOSIS — I5022 Chronic systolic (congestive) heart failure: Secondary | ICD-10-CM | POA: Diagnosis not present

## 2023-11-15 DIAGNOSIS — R0902 Hypoxemia: Secondary | ICD-10-CM | POA: Diagnosis not present

## 2023-11-15 DIAGNOSIS — I2584 Coronary atherosclerosis due to calcified coronary lesion: Secondary | ICD-10-CM | POA: Diagnosis not present

## 2023-11-15 DIAGNOSIS — I110 Hypertensive heart disease with heart failure: Secondary | ICD-10-CM | POA: Insufficient documentation

## 2023-11-15 DIAGNOSIS — N390 Urinary tract infection, site not specified: Secondary | ICD-10-CM | POA: Insufficient documentation

## 2023-11-15 DIAGNOSIS — R071 Chest pain on breathing: Secondary | ICD-10-CM | POA: Diagnosis not present

## 2023-11-15 DIAGNOSIS — Z79899 Other long term (current) drug therapy: Secondary | ICD-10-CM | POA: Insufficient documentation

## 2023-11-15 DIAGNOSIS — J441 Chronic obstructive pulmonary disease with (acute) exacerbation: Secondary | ICD-10-CM | POA: Insufficient documentation

## 2023-11-15 DIAGNOSIS — R0602 Shortness of breath: Secondary | ICD-10-CM | POA: Diagnosis not present

## 2023-11-15 DIAGNOSIS — Z7982 Long term (current) use of aspirin: Secondary | ICD-10-CM | POA: Insufficient documentation

## 2023-11-15 DIAGNOSIS — J8410 Pulmonary fibrosis, unspecified: Secondary | ICD-10-CM | POA: Diagnosis not present

## 2023-11-15 DIAGNOSIS — I6529 Occlusion and stenosis of unspecified carotid artery: Secondary | ICD-10-CM | POA: Diagnosis not present

## 2023-11-15 DIAGNOSIS — Z87891 Personal history of nicotine dependence: Secondary | ICD-10-CM | POA: Diagnosis not present

## 2023-11-15 DIAGNOSIS — I6523 Occlusion and stenosis of bilateral carotid arteries: Secondary | ICD-10-CM | POA: Diagnosis not present

## 2023-11-15 DIAGNOSIS — J439 Emphysema, unspecified: Secondary | ICD-10-CM | POA: Diagnosis not present

## 2023-11-15 DIAGNOSIS — I2510 Atherosclerotic heart disease of native coronary artery without angina pectoris: Secondary | ICD-10-CM | POA: Diagnosis not present

## 2023-11-15 DIAGNOSIS — J449 Chronic obstructive pulmonary disease, unspecified: Secondary | ICD-10-CM | POA: Diagnosis not present

## 2023-11-15 DIAGNOSIS — J849 Interstitial pulmonary disease, unspecified: Secondary | ICD-10-CM | POA: Diagnosis not present

## 2023-11-15 DIAGNOSIS — R918 Other nonspecific abnormal finding of lung field: Secondary | ICD-10-CM | POA: Diagnosis not present

## 2023-11-15 DIAGNOSIS — J4489 Other specified chronic obstructive pulmonary disease: Secondary | ICD-10-CM | POA: Insufficient documentation

## 2023-11-15 DIAGNOSIS — S46211A Strain of muscle, fascia and tendon of other parts of biceps, right arm, initial encounter: Secondary | ICD-10-CM | POA: Diagnosis not present

## 2023-11-15 MED ORDER — IOHEXOL 350 MG/ML SOLN
75.0000 mL | Freq: Once | INTRAVENOUS | Status: AC | PRN
  Administered 2023-11-15: 75 mL via INTRAVENOUS

## 2023-11-15 NOTE — Telephone Encounter (Signed)
 Nothing available until August, can I double book? Please advise and I will get patient scheduled, thanks!

## 2023-11-15 NOTE — Patient Instructions (Addendum)
 Medication Changes:  DO NOT TAKE LASIX  (FUROSEMIDE ) FOR THE NEXT 4 DAYS   DO NOT TAKE SPIRONOLACTONE  FOR THE NEXT 4 DAYS   Lab Work:  Labs done today, your results will be available in MyChart, we will contact you for abnormal readings.  Special Instructions // Education:  LIBERALIZE FLUID FOR THE NEXT 3-4 DAYS   THEN  FLUID INTAKE SHOULD BE DAILY   Follow-Up in: ECHOCARDIOGRAM AND FOLLOW UP APPOINTMENT WITH NP/PA IN 4-6 WEEKS AS SCHEDULED   At the [facility], you and your health needs are our priority. We have a designated team specialized in the treatment of Heart Failure. This Care Team includes your primary Heart Failure Specialized Cardiologist (physician), Advanced Practice Providers (APPs- Physician Assistants and Nurse Practitioners), and Pharmacist who all work together to provide you with the care you need, when you need it.   You may see any of the following providers on your designated Care Team at your next follow up:  Dr. Toribio Fuel Dr. Ezra Shuck Dr. Ria Commander Dr. Odis Brownie Greig Mosses, NP Caffie Shed, [place] [facility] [place], [place] Beckey Coe, NP [place] Lee, NP Tinnie Redman, PharmD   Please be sure to bring in all your medications bottles to every appointment.   Need to Contact Us :  If you have any questions or concerns before your next appointment please send us  a message through [place] or call our office at [phone].    TO LEAVE A MESSAGE FOR THE NURSE SELECT OPTION 2, PLEASE LEAVE A MESSAGE INCLUDING: YOUR NAME DATE OF BIRTH CALL BACK NUMBER REASON FOR CALL**this is important as we prioritize the call backs  YOU WILL RECEIVE A CALL BACK THE SAME DAY AS LONG AS YOU CALL BEFORE 4:00 PM

## 2023-11-15 NOTE — Telephone Encounter (Signed)
 Okay to double book on a day that Landry is not already.

## 2023-11-15 NOTE — Telephone Encounter (Signed)
 ATC patient and her Son, neither answered so I left a vm.

## 2023-11-15 NOTE — Progress Notes (Signed)
 ReDS Vest / Clip - 11/15/23 1100       ReDS Vest / Clip   Station Marker A    Ruler Value 31    ReDS Value Range Low volume    ReDS Actual Value 19

## 2023-11-16 ENCOUNTER — Ambulatory Visit: Payer: Self-pay | Admitting: Primary Care

## 2023-11-16 DIAGNOSIS — J181 Lobar pneumonia, unspecified organism: Secondary | ICD-10-CM

## 2023-11-16 DIAGNOSIS — J449 Chronic obstructive pulmonary disease, unspecified: Secondary | ICD-10-CM | POA: Diagnosis not present

## 2023-11-16 DIAGNOSIS — D509 Iron deficiency anemia, unspecified: Secondary | ICD-10-CM | POA: Diagnosis not present

## 2023-11-16 DIAGNOSIS — R0609 Other forms of dyspnea: Secondary | ICD-10-CM | POA: Diagnosis not present

## 2023-11-16 DIAGNOSIS — S46211D Strain of muscle, fascia and tendon of other parts of biceps, right arm, subsequent encounter: Secondary | ICD-10-CM | POA: Diagnosis not present

## 2023-11-16 DIAGNOSIS — N390 Urinary tract infection, site not specified: Secondary | ICD-10-CM | POA: Diagnosis not present

## 2023-11-16 NOTE — Telephone Encounter (Signed)
 Patient scheduled 7/17 w/ Beth!

## 2023-11-18 ENCOUNTER — Ambulatory Visit: Admitting: Primary Care

## 2023-11-18 ENCOUNTER — Encounter: Payer: Self-pay | Admitting: Primary Care

## 2023-11-18 ENCOUNTER — Encounter (HOSPITAL_COMMUNITY): Payer: Self-pay

## 2023-11-18 ENCOUNTER — Inpatient Hospital Stay (HOSPITAL_COMMUNITY)
Admission: EM | Admit: 2023-11-18 | Discharge: 2023-11-22 | DRG: 194 | Disposition: A | Discharge status: Home Health | Source: Ambulatory Visit | Attending: Internal Medicine | Admitting: Internal Medicine

## 2023-11-18 ENCOUNTER — Other Ambulatory Visit: Payer: Self-pay

## 2023-11-18 ENCOUNTER — Emergency Department (HOSPITAL_COMMUNITY)

## 2023-11-18 ENCOUNTER — Telehealth: Payer: Self-pay

## 2023-11-18 VITALS — BP 92/51 | HR 77 | Ht 63.0 in | Wt 137.0 lb

## 2023-11-18 DIAGNOSIS — I517 Cardiomegaly: Secondary | ICD-10-CM | POA: Diagnosis not present

## 2023-11-18 DIAGNOSIS — Z8673 Personal history of transient ischemic attack (TIA), and cerebral infarction without residual deficits: Secondary | ICD-10-CM

## 2023-11-18 DIAGNOSIS — I425 Other restrictive cardiomyopathy: Secondary | ICD-10-CM | POA: Diagnosis present

## 2023-11-18 DIAGNOSIS — J441 Chronic obstructive pulmonary disease with (acute) exacerbation: Principal | ICD-10-CM | POA: Diagnosis present

## 2023-11-18 DIAGNOSIS — Z90710 Acquired absence of both cervix and uterus: Secondary | ICD-10-CM

## 2023-11-18 DIAGNOSIS — F05 Delirium due to known physiological condition: Secondary | ICD-10-CM | POA: Diagnosis not present

## 2023-11-18 DIAGNOSIS — I110 Hypertensive heart disease with heart failure: Secondary | ICD-10-CM | POA: Diagnosis present

## 2023-11-18 DIAGNOSIS — J189 Pneumonia, unspecified organism: Secondary | ICD-10-CM | POA: Diagnosis present

## 2023-11-18 DIAGNOSIS — J9621 Acute and chronic respiratory failure with hypoxia: Secondary | ICD-10-CM | POA: Diagnosis present

## 2023-11-18 DIAGNOSIS — Z7982 Long term (current) use of aspirin: Secondary | ICD-10-CM

## 2023-11-18 DIAGNOSIS — Z1152 Encounter for screening for COVID-19: Secondary | ICD-10-CM

## 2023-11-18 DIAGNOSIS — Z87891 Personal history of nicotine dependence: Secondary | ICD-10-CM

## 2023-11-18 DIAGNOSIS — Z515 Encounter for palliative care: Secondary | ICD-10-CM | POA: Diagnosis not present

## 2023-11-18 DIAGNOSIS — J4540 Moderate persistent asthma, uncomplicated: Secondary | ICD-10-CM | POA: Diagnosis not present

## 2023-11-18 DIAGNOSIS — Z9981 Dependence on supplemental oxygen: Secondary | ICD-10-CM

## 2023-11-18 DIAGNOSIS — I2510 Atherosclerotic heart disease of native coronary artery without angina pectoris: Secondary | ICD-10-CM | POA: Diagnosis present

## 2023-11-18 DIAGNOSIS — I509 Heart failure, unspecified: Secondary | ICD-10-CM

## 2023-11-18 DIAGNOSIS — G4700 Insomnia, unspecified: Secondary | ICD-10-CM | POA: Diagnosis present

## 2023-11-18 DIAGNOSIS — I5042 Chronic combined systolic (congestive) and diastolic (congestive) heart failure: Secondary | ICD-10-CM

## 2023-11-18 DIAGNOSIS — Z833 Family history of diabetes mellitus: Secondary | ICD-10-CM

## 2023-11-18 DIAGNOSIS — I700 Atherosclerosis of aorta: Secondary | ICD-10-CM | POA: Diagnosis not present

## 2023-11-18 DIAGNOSIS — J181 Lobar pneumonia, unspecified organism: Principal | ICD-10-CM | POA: Diagnosis present

## 2023-11-18 DIAGNOSIS — Z880 Allergy status to penicillin: Secondary | ICD-10-CM

## 2023-11-18 DIAGNOSIS — I6523 Occlusion and stenosis of bilateral carotid arteries: Secondary | ICD-10-CM | POA: Diagnosis not present

## 2023-11-18 DIAGNOSIS — I270 Primary pulmonary hypertension: Secondary | ICD-10-CM | POA: Diagnosis not present

## 2023-11-18 DIAGNOSIS — I493 Ventricular premature depolarization: Secondary | ICD-10-CM | POA: Diagnosis present

## 2023-11-18 DIAGNOSIS — J439 Emphysema, unspecified: Secondary | ICD-10-CM | POA: Diagnosis present

## 2023-11-18 DIAGNOSIS — R4189 Other symptoms and signs involving cognitive functions and awareness: Secondary | ICD-10-CM | POA: Diagnosis present

## 2023-11-18 DIAGNOSIS — J310 Chronic rhinitis: Secondary | ICD-10-CM

## 2023-11-18 DIAGNOSIS — J8410 Pulmonary fibrosis, unspecified: Secondary | ICD-10-CM | POA: Diagnosis present

## 2023-11-18 DIAGNOSIS — Z66 Do not resuscitate: Secondary | ICD-10-CM | POA: Diagnosis not present

## 2023-11-18 DIAGNOSIS — J9611 Chronic respiratory failure with hypoxia: Secondary | ICD-10-CM | POA: Diagnosis present

## 2023-11-18 DIAGNOSIS — G4733 Obstructive sleep apnea (adult) (pediatric): Secondary | ICD-10-CM | POA: Diagnosis present

## 2023-11-18 DIAGNOSIS — Z8249 Family history of ischemic heart disease and other diseases of the circulatory system: Secondary | ICD-10-CM

## 2023-11-18 DIAGNOSIS — R0602 Shortness of breath: Secondary | ICD-10-CM | POA: Diagnosis not present

## 2023-11-18 DIAGNOSIS — R911 Solitary pulmonary nodule: Secondary | ICD-10-CM | POA: Diagnosis present

## 2023-11-18 DIAGNOSIS — Z881 Allergy status to other antibiotic agents status: Secondary | ICD-10-CM

## 2023-11-18 DIAGNOSIS — Z7189 Other specified counseling: Secondary | ICD-10-CM | POA: Diagnosis not present

## 2023-11-18 DIAGNOSIS — J9811 Atelectasis: Secondary | ICD-10-CM | POA: Diagnosis not present

## 2023-11-18 DIAGNOSIS — J90 Pleural effusion, not elsewhere classified: Secondary | ICD-10-CM | POA: Diagnosis not present

## 2023-11-18 DIAGNOSIS — I5022 Chronic systolic (congestive) heart failure: Secondary | ICD-10-CM | POA: Diagnosis present

## 2023-11-18 DIAGNOSIS — J440 Chronic obstructive pulmonary disease with acute lower respiratory infection: Secondary | ICD-10-CM | POA: Diagnosis present

## 2023-11-18 DIAGNOSIS — Z79899 Other long term (current) drug therapy: Secondary | ICD-10-CM

## 2023-11-18 DIAGNOSIS — R918 Other nonspecific abnormal finding of lung field: Secondary | ICD-10-CM | POA: Diagnosis not present

## 2023-11-18 DIAGNOSIS — F03918 Unspecified dementia, unspecified severity, with other behavioral disturbance: Secondary | ICD-10-CM | POA: Diagnosis present

## 2023-11-18 DIAGNOSIS — Z825 Family history of asthma and other chronic lower respiratory diseases: Secondary | ICD-10-CM

## 2023-11-18 LAB — RESP PANEL BY RT-PCR (RSV, FLU A&B, COVID)  RVPGX2
Influenza A by PCR: NEGATIVE
Influenza B by PCR: NEGATIVE
Resp Syncytial Virus by PCR: NEGATIVE
SARS Coronavirus 2 by RT PCR: NEGATIVE

## 2023-11-18 LAB — I-STAT CG4 LACTIC ACID, ED: Lactic Acid, Venous: 1 mmol/L (ref 0.5–1.9)

## 2023-11-18 LAB — CBC WITH DIFFERENTIAL/PLATELET
Abs Immature Granulocytes: 0.19 K/uL — ABNORMAL HIGH (ref 0.00–0.07)
Basophils Absolute: 0 K/uL (ref 0.0–0.1)
Basophils Relative: 0 %
Eosinophils Absolute: 0 K/uL (ref 0.0–0.5)
Eosinophils Relative: 0 %
HCT: 40 % (ref 36.0–46.0)
Hemoglobin: 12.1 g/dL (ref 12.0–15.0)
Immature Granulocytes: 1 %
Lymphocytes Relative: 7 %
Lymphs Abs: 1 K/uL (ref 0.7–4.0)
MCH: 29.2 pg (ref 26.0–34.0)
MCHC: 30.3 g/dL (ref 30.0–36.0)
MCV: 96.4 fL (ref 80.0–100.0)
Monocytes Absolute: 1.2 K/uL — ABNORMAL HIGH (ref 0.1–1.0)
Monocytes Relative: 8 %
Neutro Abs: 13.1 K/uL — ABNORMAL HIGH (ref 1.7–7.7)
Neutrophils Relative %: 84 %
Platelets: 235 K/uL (ref 150–400)
RBC: 4.15 MIL/uL (ref 3.87–5.11)
RDW: 14.3 % (ref 11.5–15.5)
WBC: 15.5 K/uL — ABNORMAL HIGH (ref 4.0–10.5)
nRBC: 0 % (ref 0.0–0.2)

## 2023-11-18 LAB — PROCALCITONIN: Procalcitonin: 0.16 ng/mL

## 2023-11-18 LAB — BASIC METABOLIC PANEL WITH GFR
Anion gap: 9 (ref 5–15)
BUN: 15 mg/dL (ref 8–23)
CO2: 27 mmol/L (ref 22–32)
Calcium: 8.4 mg/dL — ABNORMAL LOW (ref 8.9–10.3)
Chloride: 98 mmol/L (ref 98–111)
Creatinine, Ser: 0.94 mg/dL (ref 0.44–1.00)
GFR, Estimated: 60 mL/min — ABNORMAL LOW (ref >60)
Glucose, Bld: 113 mg/dL — ABNORMAL HIGH (ref 70–99)
Potassium: 4.2 mmol/L (ref 3.5–5.1)
Sodium: 134 mmol/L — ABNORMAL LOW (ref 135–145)

## 2023-11-18 LAB — BRAIN NATRIURETIC PEPTIDE: B Natriuretic Peptide: 371.7 pg/mL — ABNORMAL HIGH (ref 0.0–100.0)

## 2023-11-18 MED ORDER — LEVOFLOXACIN IN D5W 750 MG/150ML IV SOLN: 750.0000 mg | INTRAVENOUS | Status: DC

## 2023-11-18 MED ORDER — SPIRONOLACTONE 12.5 MG HALF TABLET
12.5000 mg | ORAL_TABLET | Freq: Every day | ORAL | Status: DC
  Administered 2023-11-19 – 2023-11-22 (×4): 12.5 mg via ORAL
  Filled 2023-11-18 (×4): qty 1

## 2023-11-18 MED ORDER — IPRATROPIUM BROMIDE 0.02 % IN SOLN
0.5000 mg | Freq: Four times a day (QID) | RESPIRATORY_TRACT | Status: DC
  Administered 2023-11-18 – 2023-11-19 (×3): 0.5 mg via RESPIRATORY_TRACT
  Filled 2023-11-18 (×5): qty 2.5

## 2023-11-18 MED ORDER — ASPIRIN 81 MG PO TBEC
81.0000 mg | DELAYED_RELEASE_TABLET | Freq: Every day | ORAL | Status: DC
  Administered 2023-11-19 – 2023-11-22 (×4): 81 mg via ORAL
  Filled 2023-11-18 (×4): qty 1

## 2023-11-18 MED ORDER — PREDNISONE 20 MG PO TABS
40.0000 mg | ORAL_TABLET | Freq: Every day | ORAL | Status: DC
  Administered 2023-11-19: 40 mg via ORAL
  Filled 2023-11-18: qty 2

## 2023-11-18 MED ORDER — ALBUTEROL SULFATE (2.5 MG/3ML) 0.083% IN NEBU: 2.5000 mg | INHALATION_SOLUTION | Freq: Four times a day (QID) | RESPIRATORY_TRACT | Status: DC | PRN

## 2023-11-18 MED ORDER — SODIUM CHLORIDE 0.9% FLUSH
3.0000 mL | Freq: Two times a day (BID) | INTRAVENOUS | Status: DC
  Administered 2023-11-18 – 2023-11-22 (×8): 3 mL via INTRAVENOUS

## 2023-11-18 MED ORDER — ACETAMINOPHEN 650 MG RE SUPP: 650.0000 mg | Freq: Four times a day (QID) | RECTAL | Status: DC | PRN

## 2023-11-18 MED ORDER — IPRATROPIUM BROMIDE 0.02 % IN SOLN
0.5000 mg | Freq: Once | RESPIRATORY_TRACT | Status: AC
  Administered 2023-11-18: 0.5 mg via RESPIRATORY_TRACT
  Filled 2023-11-18: qty 2.5

## 2023-11-18 MED ORDER — POLYETHYLENE GLYCOL 3350 17 G PO PACK: 17.0000 g | PACK | Freq: Every day | ORAL | Status: DC | PRN

## 2023-11-18 MED ORDER — METHYLPREDNISOLONE SODIUM SUCC 125 MG IJ SOLR
125.0000 mg | INTRAMUSCULAR | Status: AC
  Administered 2023-11-18: 125 mg via INTRAVENOUS
  Filled 2023-11-18: qty 2

## 2023-11-18 MED ORDER — ALBUTEROL SULFATE (2.5 MG/3ML) 0.083% IN NEBU
5.0000 mg | INHALATION_SOLUTION | Freq: Once | RESPIRATORY_TRACT | Status: AC
  Administered 2023-11-18: 5 mg via RESPIRATORY_TRACT
  Filled 2023-11-18: qty 6

## 2023-11-18 MED ORDER — ACETAMINOPHEN 325 MG PO TABS
650.0000 mg | ORAL_TABLET | Freq: Four times a day (QID) | ORAL | Status: DC | PRN
  Administered 2023-11-19: 650 mg via ORAL
  Filled 2023-11-18: qty 2

## 2023-11-18 MED ORDER — ROSUVASTATIN CALCIUM 20 MG PO TABS
20.0000 mg | ORAL_TABLET | Freq: Every day | ORAL | Status: DC
  Administered 2023-11-18 – 2023-11-21 (×4): 20 mg via ORAL
  Filled 2023-11-18 (×4): qty 1

## 2023-11-18 MED ORDER — LEVOFLOXACIN IN D5W 750 MG/150ML IV SOLN
750.0000 mg | Freq: Once | INTRAVENOUS | Status: AC
  Administered 2023-11-18: 750 mg via INTRAVENOUS
  Filled 2023-11-18: qty 150

## 2023-11-18 MED ORDER — SODIUM CHLORIDE 0.9 % IV SOLN
1.0000 g | INTRAVENOUS | Status: DC
  Administered 2023-11-19 – 2023-11-21 (×4): 1 g via INTRAVENOUS
  Filled 2023-11-18 (×4): qty 10

## 2023-11-18 MED ORDER — BISOPROLOL FUMARATE 10 MG PO TABS
10.0000 mg | ORAL_TABLET | Freq: Every day | ORAL | Status: DC
  Administered 2023-11-19 – 2023-11-22 (×4): 10 mg via ORAL
  Filled 2023-11-18 (×5): qty 1

## 2023-11-18 MED ORDER — ENOXAPARIN SODIUM 40 MG/0.4ML IJ SOSY
40.0000 mg | PREFILLED_SYRINGE | INTRAMUSCULAR | Status: DC
  Administered 2023-11-18 – 2023-11-21 (×4): 40 mg via SUBCUTANEOUS
  Filled 2023-11-18 (×4): qty 0.4

## 2023-11-18 MED ORDER — BUDESON-GLYCOPYRROL-FORMOTEROL 160-9-4.8 MCG/ACT IN AERO
2.0000 | INHALATION_SPRAY | Freq: Two times a day (BID) | RESPIRATORY_TRACT | Status: DC
  Filled 2023-11-18: qty 5.9

## 2023-11-18 MED ORDER — SODIUM CHLORIDE 0.9 % IV SOLN
500.0000 mg | INTRAVENOUS | Status: DC
  Administered 2023-11-19: 500 mg via INTRAVENOUS
  Filled 2023-11-18 (×2): qty 5

## 2023-11-18 NOTE — ED Provider Notes (Signed)
 [place] EMERGENCY DEPARTMENT AT [facility] Provider Note   CSN: [id number] Arrival date & time: 11/18/23  1231     Patient presents with: Shortness of Breath   Joan Smith is a 83 y.o. female.  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  Patient reports that since the holidays she has been having intermittent coughs.  Has been on 4 rounds of antibiotics.  Currently on a ciprofloxacin  course.  No fevers or chills.  Cough is productive.  On 11/15/2023 she had a CTA that was ordered by her pulmonologist which does show possible pulm multifocal pneumonia.  Saw them again today and they recommended that she be admitted for IV antibiotics.       Prior to Admission medications   Medication Sig Start Date End Date Taking? Authorizing Provider  albuterol  (PROVENTIL ) (2.5 MG/3ML) 0.083% nebulizer solution Take 3 mLs (2.5 mg total) by nebulization every 6 (six) hours as needed for wheezing or shortness of breath. 11/12/23   Hope Almarie ORN, NP  aspirin  81 MG EC tablet Take 81 mg by mouth daily.    [provider]  benzonatate  (TESSALON ) 200 MG capsule Take 1 capsule (200 mg total) by mouth 3 (three) times daily as needed for cough. 11/30/22   Cobb, Comer GAILS, NP  bisoprolol  (ZEBETA ) 10 MG tablet Take 1 tablet (10 mg total) by mouth daily. 10/19/23   Bensimhon, Toribio SAUNDERS, MD  budesonide -glycopyrrolate -formoterol  (BREZTRI  AEROSPHERE) 160-9-4.8 MCG/ACT AERO inhaler Inhale 2 puffs into the lungs in the morning and at bedtime. 10/20/23   Cobb, Comer GAILS, NP  cetirizine (ZYRTEC) 10 MG tablet Take 10 mg by mouth daily.    [provider]  ciprofloxacin  (CIPRO ) 500 MG tablet Take 1 tablet (500 mg total) by mouth 2 (two) times daily. 11/12/23   Hope Almarie ORN, NP  furosemide   (LASIX ) 20 MG tablet Take 1 tablet (20 mg total) by mouth as needed. 02/02/23 11/18/23  Bensimhon, Daniel R, MD  ipratropium (ATROVENT ) 0.03 % nasal spray Place 2 sprays into both nostrils 3 (three) times daily. 11/30/22   Cobb, Comer GAILS, NP  levalbuterol [facility] HFA) 45 MCG/ACT inhaler Inhale 2 puffs into the lungs as needed for wheezing or shortness of breath.    [provider]  magnesium oxide (MAG-OX) 400 MG tablet Take 400 mg by mouth daily. 05/22/20   [provider]  NON FORMULARY RELAXIUM as needed for sleep    [provider]  rosuvastatin  (CRESTOR ) 20 MG tablet Take 1 tablet by mouth at bedtime. 06/06/20   [provider]  Spacer/Aero-Holding Raguel FRENCH Use with inhaler 06/09/21   Cobb, Comer GAILS, NP  spironolactone  (ALDACTONE ) 25 MG tablet Take 0.5 tablets (12.5 mg total) by mouth daily. 08/06/23   Bensimhon, Toribio SAUNDERS, MD    Allergies: Amoxicillin and Ceclor [cefaclor]    Review of Systems  Updated Vital Signs BP 129/74 (BP Location: Left Arm)   Pulse 73   Temp 98.1 F (36.7 C) (Oral)   Resp 16   Ht 5' 4 (1.626 m)   Wt 62.1 kg   SpO2 96%   BMI 23.52 kg/m   Physical Exam Vitals and nursing note reviewed.  Constitutional:      General: She is not in acute distress.    Appearance: She is well-developed.  HENT:  Head: Normocephalic and atraumatic.     Right Ear: External ear normal.     Left Ear: External ear normal.     Nose: Nose normal.  Eyes:     Extraocular Movements: Extraocular movements intact.     Conjunctiva/sclera: Conjunctivae normal.     Pupils: Pupils are equal, round, and reactive to light.  Cardiovascular:     Rate and Rhythm: Normal rate and regular rhythm.     Heart sounds: No murmur heard. Pulmonary:     Effort: Pulmonary effort is normal. No respiratory distress.     Breath sounds: Rhonchi present.     Comments: On 3 L Musculoskeletal:     Cervical back: Normal range of motion and neck supple.      Right lower leg: No edema.     Left lower leg: No edema.  Skin:    General: Skin is warm and dry.  Neurological:     Mental Status: She is alert and oriented to person, place, and time. Mental status is at baseline.  Psychiatric:        Mood and Affect: Mood normal.     (all labs ordered are listed, but only abnormal results are displayed) Labs Reviewed  BASIC METABOLIC PANEL WITH GFR - Abnormal; Notable for the following components:      Result Value   Sodium 134 (*)    Glucose, Bld 113 (*)    Calcium  8.4 (*)    GFR, Estimated 60 (*)    All other components within normal limits  CBC WITH DIFFERENTIAL/PLATELET - Abnormal; Notable for the following components:   WBC 15.5 (*)    Neutro Abs 13.1 (*)    Monocytes Absolute 1.2 (*)    Abs Immature Granulocytes 0.19 (*)    All other components within normal limits  BRAIN NATRIURETIC PEPTIDE - Abnormal; Notable for the following components:   B Natriuretic Peptide 371.7 (*)    All other components within normal limits  RESP PANEL BY RT-PCR (RSV, FLU A&B, COVID)  RVPGX2  CULTURE, BLOOD (ROUTINE X 2)  CULTURE, BLOOD (ROUTINE X 2)  PROCALCITONIN  I-STAT CG4 LACTIC ACID, ED  I-STAT CG4 LACTIC ACID, ED    EKG: EKG Interpretation Date/Time:  Thursday November 18 2023 13:16:08 EDT Ventricular Rate:  76 PR Interval:  120 QRS Duration:  128 QT Interval:  408 QTC Calculation: 459 R Axis:   -64  Text Interpretation: Sinus rhythm with occasional Premature ventricular complexes Right bundle branch block Left anterior fascicular block Bifascicular block Left ventricular hypertrophy with repolarization abnormality ( R in aVL , Romhilt-Estes ) Possible Lateral infarct , age undetermined Abnormal ECG When compared with ECG of 15-Nov-2023 11:08, PREVIOUS ECG IS PRESENT Confirmed by Yolande Charleston [phone]) on 11/18/2023 3:36:58 PM  Radiology: [facility] Chest 2 View Result Date: 11/18/2023 CLINICAL DATA:  Shortness of breath. EXAM: CHEST - 2 VIEW  COMPARISON:  October 13, 2023. FINDINGS: Stable cardiomegaly. Right upper lobe scarring is again noted. Minimal right basilar subsegmental atelectasis or scarring is noted. Mild left basilar opacity is noted concerning for atelectasis or infiltrate with associated effusion. Bony thorax is unremarkable. IMPRESSION: Mild left basilar opacity concerning for atelectasis or infiltrate with associated effusion. Aortic Atherosclerosis (ICD10-I70.0). Electronically Signed   By: Lynwood Landy Raddle M.D.   On: 11/18/2023 15:05    {Document cardiac monitor, telemetry assessment procedure when appropriate:[id number]} Procedures   Medications Ordered in the ED  levofloxacin  (LEVAQUIN ) IVPB 750 mg (750 mg Intravenous New Bag/Given 11/18/23 1539)  methylPREDNISolone  sodium succinate (SOLU-MEDROL ) 125 mg/2 mL injection 125 mg (125 mg Intravenous Given 11/18/23 1538)  ipratropium (ATROVENT ) nebulizer solution 0.5 mg (0.5 mg Nebulization Given 11/18/23 1539)  albuterol  (PROVENTIL ) (2.5 MG/3ML) 0.083% nebulizer solution 5 mg (5 mg Nebulization Given 11/18/23 1539)      {Click here for ABCD2, HEART and other calculators REFRESH Note before signing:1}                              Medical Decision Making Risk Prescription drug management. Decision regarding hospitalization.   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  Initial Ddx:  Pneumonia, CHF, mass, COPD exacerbation  MDM/Course:  Patient presents emergency department with cough and shortness of breath.  Has been going on for several months.  Has been on several treatments for pneumonia but appears to keep reoccurring.  Did have a CT scan done several days ago which shows multifocal pneumonia.  On exam is on 2 L nasal cannula.  Does have diffuse rhonchi.  Was given Levaquin  after chest x-ray did show pneumonia as well.  She is COVID and flu this pending.  Was  treated with nebs and steroids for suspected COPD exacerbation as well.  Upon re-evaluation patient was stable.  Discussed with hospitalist for admission***.   This patient presents to the ED for concern of complaints listed in HPI, this involves an extensive number of treatment options, and is a complaint that carries with it a high risk of complications and morbidity. Disposition including potential need for admission considered.   Dispo: Admit to Floor  Additional history obtained from daughter Records reviewed Outpatient Clinic Notes The following labs were independently interpreted: Chemistry and show no acute abnormality I independently reviewed the following imaging with scope of interpretation limited to determining acute life threatening conditions related to emergency care: Chest x-ray and agree with the radiologist interpretation with the following exceptions: none I personally reviewed and interpreted cardiac monitoring: normal sinus rhythm  I personally reviewed and interpreted the pt's EKG: see above for interpretation  I have reviewed the patients home medications and made adjustments as needed Consults: Hospitalist Social Determinants of health:  Geriatric  Portions of this note were generated with Scientist, clinical (histocompatibility and immunogenetics). Dictation errors may occur despite best attempts at proofreading.     Final diagnoses:  COPD exacerbation (HCC)  Community acquired pneumonia, unspecified laterality    ED Discharge Orders     None

## 2023-11-18 NOTE — ED Notes (Signed)
 CCMD contacted to place the patient on cardiac monitoring services.

## 2023-11-18 NOTE — Patient Instructions (Signed)
 I spoke with Dr. Neda, he agrees with ED evaluation/hospital admission for Left upper lobe pneumonia due to failure outpatient treatment and significant leukocytosis   VISIT SUMMARY: Today, we discussed your worsening respiratory symptoms and overall weakness. You have been experiencing increased fatigue and a persistent cough, and your recent CT scan showed developing pneumonia in the left upper lobe. We also reviewed your history of emphysema and coronary artery disease. Given your current condition, we discussed the need for hospital admission for further treatment.  YOUR PLAN: -PNEUMONIA: Pneumonia is an infection in the lungs that can cause symptoms like cough, fever, and difficulty breathing. Your pneumonia has not improved with the current antibiotics, and your white blood cell count is elevated, which may indicate an ongoing infection. We recommend a chest x-ray to check for improvement and suggest hospital admission for IV antibiotics and further management.    -CORONARY ARTERY DISEASE: Coronary artery disease is a condition where the heart's blood vessels are narrowed or blocked. You have severe involvement in four vessels, but there are no acute symptoms currently.  -GOALS OF CARE: We discussed the importance of hospital admission due to the failure of outpatient treatment and the risk of your condition worsening. Although you are reluctant, it is crucial for your health and safety to receive proper care in the hospital.  INSTRUCTIONS: Please proceed to the hospital for admission as discussed. The hospital team will provide IV antibiotics and further management. A chest x-ray will be done to assess your pneumonia.

## 2023-11-18 NOTE — ED Triage Notes (Signed)
 Patient has had a large work up with cardiology and pulmonology and has been sent to ER for work and IV antibiotics for PNA.  Patient currently on oxygen  at 3L [place].

## 2023-11-18 NOTE — Progress Notes (Signed)
 ATC X1. LMTCB

## 2023-11-18 NOTE — H&P (Signed)
 History and Physical   Joan Smith FMW:[id number] DOB: 05/21/1940 DOA: 11/18/2023  PCP: Keren [name], MD   Patient coming from: Home/pulmonologist office  Chief Complaint: Cough, pneumonia  HPI: Joan Smith is a 83 y.o. female with medical history significant of TIA, carotid artery disease, anemia, CHF, asthma, restrictive lung disease, pulmonary hypertension, pulmonary fibrosis, COPD, chronic respiratory failure with hypoxia presenting with persistent cough and pneumonia not responding to outpatient intervention.  Patient has had several months of persistent cough.  She was initially treated with 2 rounds of prednisone  with transient improvement each time.  She did develop recurrence of cough with associated wheezing.  Eventually workup included a CT on 7/14 which showed evidence of developing pneumonia.  Patient's symptoms continue to persist despite course of doxycycline  and ciprofloxacin .  Patient sent to the ED from pulmonologist office for admission/IV antibiotics.  Is chronically on 2-3 L supplemental oxygen  but this is at night and intermittent.  Has been needing oxygen  constantly since developing suspected pneumonia.  Denies fevers, chills, chest pain, abdominal pain, constipation, diarrhea, nausea, vomiting.  Vital signs in the ED notable for blood pressure in the  ED Course: 90s-120 systolic, requiring 2 L to maintain saturations.  Lab workup included BMP with sodium 134, glucose 113, calcium  8.4.  CBC with leukocytosis of 15.5.  BNP 371, lactic acid normal with repeat pending.  Procalcitonin normal/negative.  Respiratory panel for flu COVID RSV negative.  Chest x-ray showed mild left basilar opacity representing atelectasis versus infiltrate with associated effusion.  CT on 7/14 showed changes consistent with developing infection at the left upper lobe and lingula with new micronodules and background of ILD and emphysema.  Patient started on Solu-Medrol , Atrovent ,  albuterol , Levaquin  (history of amoxicillin allergy) in the ED.  Review of Systems: As per HPI otherwise all other systems reviewed and are negative.  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 Her smoking use included cigarettes. She started smoking about 74 years ago. She has a 75 pack-year smoking history. She has never used smokeless tobacco. She reports that she does not drink alcohol and does not use drugs.  Allergies  Allergen Reactions   Amoxicillin Diarrhea and Nausea And Vomiting   Ceclor [Cefaclor] Hives and Rash    Family History  Problem Relation Age of Onset   COPD Mother    Diabetes Mellitus II Mother    CAD Mother    Heart failure Mother    CAD Father    Heart attack Father    CAD Sister    Cardiomyopathy Brother   Reviewed on admission  Prior to Admission medications   Medication Sig Start Date End Date Taking? Authorizing Provider  albuterol  (PROVENTIL ) (2.5 MG/3ML) 0.083% nebulizer solution Take 3 mLs (2.5 mg total) by nebulization every 6 (six) hours  as needed for wheezing or shortness of breath. 11/12/23   Hope Almarie ORN, NP  aspirin  81 MG EC tablet Take 81 mg by mouth daily.    [provider]  benzonatate  (TESSALON ) 200 MG capsule Take 1 capsule (200 mg total) by mouth 3 (three) times  daily as needed for cough. 11/30/22   Cobb, Comer GAILS, NP  bisoprolol  (ZEBETA ) 10 MG tablet Take 1 tablet (10 mg total) by mouth daily. 10/19/23   Bensimhon, Toribio SAUNDERS, MD  budesonide -glycopyrrolate -formoterol  (BREZTRI  AEROSPHERE) 160-9-4.8 MCG/ACT AERO inhaler Inhale 2 puffs into the lungs in the morning and at bedtime. 10/20/23   Cobb, Comer GAILS, NP  cetirizine (ZYRTEC) 10 MG tablet Take 10 mg by mouth daily.    [provider]  ciprofloxacin  (CIPRO ) 500 MG tablet Take 1 tablet (500 mg total) by mouth 2 (two) times daily. 11/12/23   Hope Almarie ORN, NP  furosemide  (LASIX ) 20 MG tablet Take 1 tablet (20 mg total) by mouth as needed. 02/02/23 11/18/23  Bensimhon, Daniel R, MD  ipratropium (ATROVENT ) 0.03 % nasal spray Place 2 sprays into both nostrils 3 (three) times daily. 11/30/22   Cobb, Comer GAILS, NP  levalbuterol [facility] HFA) 45 MCG/ACT inhaler Inhale 2 puffs into the lungs as needed for wheezing or shortness of breath.    [provider]  magnesium oxide (MAG-OX) 400 MG tablet Take 400 mg by mouth daily. 05/22/20   [provider]  NON FORMULARY RELAXIUM as needed for sleep    [provider]  rosuvastatin  (CRESTOR ) 20 MG tablet Take 1 tablet by mouth at bedtime. 06/06/20   [provider]  Spacer/Aero-Holding Raguel FRENCH Use with inhaler 06/09/21   Cobb, Comer GAILS, NP  spironolactone  (ALDACTONE ) 25 MG tablet Take 0.5 tablets (12.5 mg total) by mouth daily. 08/06/23   Bensimhon, Toribio SAUNDERS, MD    Physical Exam: Vitals:   11/18/23 1303 11/18/23 1336  BP:  129/74  Pulse:  73  Resp:  16  Temp:  98.1 F (36.7 C)  TempSrc:  Oral  SpO2:  96%  Weight: 62.1 kg 62.1 kg  Height: 5' 3 (1.6 m) 5' 4 (1.626 m)    Physical Exam Constitutional:      General: She is not in acute distress.    Appearance: Normal appearance.  HENT:     Head: Normocephalic and atraumatic.     Mouth/Throat:     Mouth: Mucous membranes are moist.     Pharynx: Oropharynx  is clear.  Eyes:     Extraocular Movements: Extraocular movements intact.     Pupils: Pupils are equal, round, and reactive to light.  Cardiovascular:     Rate and Rhythm: Normal rate and regular rhythm.     Pulses: Normal pulses.     Heart sounds: Normal heart sounds.  Pulmonary:     Effort: Pulmonary effort is normal. No respiratory distress.     Breath sounds: Rales present.  Abdominal:     General: Bowel sounds are normal. There is no distension.     Palpations: Abdomen is soft.     Tenderness: There is no abdominal tenderness.  Musculoskeletal:        General: No swelling or deformity.     Right lower leg: No edema.     Left lower leg: No edema.  Skin:    General: Skin is warm and dry.  Neurological:     General: No focal deficit present.     Mental Status: Mental status  is at baseline.    Labs on Admission: I have personally reviewed following labs and imaging studies  CBC: Recent Labs  Lab 11/18/23 1316  WBC 15.5*  NEUTROABS 13.1*  HGB 12.1  HCT 40.0  MCV 96.4  PLT 235    Basic Metabolic Panel: Recent Labs  Lab 11/18/23 1316  NA 134*  K 4.2  CL 98  CO2 27  GLUCOSE 113*  BUN 15  CREATININE 0.94  CALCIUM  8.4*    GFR: Estimated Creatinine Clearance: 38.5 mL/min (by C-G formula based on SCr of 0.94 mg/dL).  Liver Function Tests: No results for input(s): AST, ALT, ALKPHOS, BILITOT, PROT, ALBUMIN in the last 168 hours.  Urine analysis: No results found for: COLORURINE, APPEARANCEUR, LABSPEC, PHURINE, GLUCOSEU, HGBUR, BILIRUBINUR, KETONESUR, PROTEINUR, UROBILINOGEN, NITRITE, LEUKOCYTESUR  Radiological Exams on Admission: DG Chest 2 View Result Date: 11/18/2023 CLINICAL DATA:  Shortness of breath. EXAM: CHEST - 2 VIEW COMPARISON:  October 13, 2023. FINDINGS: Stable cardiomegaly. Right upper lobe scarring is again noted. Minimal right basilar subsegmental atelectasis or scarring is noted. Mild left basilar opacity is  noted concerning for atelectasis or infiltrate with associated effusion. Bony thorax is unremarkable. IMPRESSION: Mild left basilar opacity concerning for atelectasis or infiltrate with associated effusion. Aortic Atherosclerosis (ICD10-I70.0). Electronically Signed   By: Lynwood Landy Raddle M.D.   On: 11/18/2023 15:05   EKG: Independently reviewed.  Sinus rhythm at 76 bpm.  PVCs noted.  Right bundle blanch block with QRS 128.  Evidence of other fascicular blocks.  QTc 459.  Assessment/Plan Active Problems:   Chronic respiratory failure with hypoxia (HCC)   CHF (congestive heart failure) (HCC)   Fibrosis of lung (HCC)   Moderate persistent asthma   Occlusion of extracranial carotid artery, bilateral   Primary pulmonary hypertension (HCC)   Primary restrictive cardiomyopathy (HCC)   History of TIA (transient ischemic attack)   Acute on chronic respite failure hypoxia Pneumonia  COPD exacerbation > Patient presenting with persistent pneumonia and wheezing concerning for COPD exacerbation.  Patient is requiring 2 L of supplemental oxygen  at all times which is increased from baseline and requiring 2 L intermittently and with sleep. > Follows with pulmonology and has received a couple courses of prednisone  for chronic cough with transient improvement.  Then had wheezing and evidence of pneumonia on CT and has had persistent symptoms and persistent increased oxygen  requirement despite course of doxycycline  followed by ciprofloxacin . > Sent to the ED for further evaluation and IV antibiotics. > In the ED patient is noted to have leukocytosis to 15.5 though procalcitonin is normal/negative.  Negative flu COVID RSV screen as well. > Patient started on Levaquin  due to history of amoxicillin allergy, we will start with Levaquin  and work to confirm this allergy.  Will also check full respiratory viral panel considering low procalcitonin. - Monitor on telemetry overnight with continuous pulse oximetry -  Continue with Levaquin  for now - Trend fever curve and WBC - Repeat procalcitonin in the morning - Supportive care - For COPD exacerbation: Continue steroids, Atrovent ,.  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  Now with recovered ejection fraction. > Last echo was in November 2024 with EF 40-45%, global hypokinesis, G1 DD, normal RV function. >   Continues to follow with heart failure clinic currently is on Lasix  as needed and primary diuretic is spironolactone . > BNP borderline in the ED at 371, possibly reactive in the setting of lung disease as  above.  But we will keep an eye on her for any CHF exacerbation component. - Ins and outs, daily weights - Monitoring on telemetry as above - Continue home spironolactone , bisoprolol   History of TIA Carotid artery disease - Continue home ASA, rosuvastatin   Asthma COPD Chronic respiratory failure Pulmonary fibrosis - Treatment for possible asthma/COPD exacerbation as above. - Continue home Breztri , albuterol   DVT prophylaxis: Lovenox  Code Status:   Full Family Communication:  Updated at bedside  Disposition Plan:   Patient is from:  Home  Anticipated DC to:  Home  Anticipated DC date:  1 to 3 days  Anticipated DC barriers: None  Consults called:  None Admission status:  Observation, telemetry  Severity of Illness: The appropriate patient status for this patient is OBSERVATION. Observation status is judged to be reasonable and necessary in order to provide the required intensity of service to ensure the patient's safety. The patient's presenting symptoms, physical exam findings, and initial radiographic and laboratory data in the context of their medical condition is felt to place them at decreased risk for further clinical deterioration. Furthermore, it is anticipated that the patient will be medically stable for discharge from the hospital within 2 midnights of  admission.    Marsa KATHEE Scurry MD [facility]  How to contact the [facility] Attending or Consulting provider 7A - 7P or covering provider during after hours 7P -7A, for this patient?   Check the care team in [facility] and look for a) attending/consulting [facility] provider listed and b) the [facility] team listed Log into [email or web address] and use [facility] universal password to access. If you do not have the password, please contact the hospital operator. Locate the [facility] provider you are looking for under [facility] and page to a number that you can be directly reached. If you still have difficulty reaching the provider, please page the [facility] (Director on Call) for the Hospitalists listed on amion for assistance.  11/18/2023, 4:03 PM

## 2023-11-18 NOTE — ED Notes (Signed)
 PT family refused blood work, stating blood work was done at the heart dr

## 2023-11-18 NOTE — Progress Notes (Signed)
 I called and spoke to Joan Smith. Joan Smith informed of Beth's note and verbalized understanding. Joan Smith is currently admitted but will call us  if anything is needed. I will order the CT.

## 2023-11-18 NOTE — Telephone Encounter (Signed)
 Copied from CRM [id number]. Topic: General - Other >> Nov 18, 2023  3:57 PM Russell PARAS wrote: Reason for CRM:   Pt contacted clinic, returning call from Krisna Omar.   Requesting call back   # [phone]   See other encounter. NFN

## 2023-11-18 NOTE — Progress Notes (Signed)
 @Patient  ID: Rock Bradley Sherod, female    DOB: Dec 16, 1940, 83 y.o.   MRN: [id number]  No chief complaint on file.   Referring provider: Keren [name], MD  HPI: 83 year old female, former smoker.  Past medical history CHF, pulmonary hypertension, TIA, COPD with asthma, chronic respiratory failure (wears nocturnal oxygen ), restrictive cardiomyopathy. Patient of Dr. Brenna.   Previous [facility] pulmonary encounter: 05/11/2023 Discussed the use of AI scribe software for clinical note transcription with the patient, who gave verbal consent to proceed.  History of Present Illness   The patient, with a known history of COPD, presents with a persistent cough that started before the holidays, approximately two to three months ago. The cough was initially managed with two rounds of prednisone , which provided temporary relief but symptoms returned shortly after completion of each course. The patient reports a congested cough and wheezing, but denies expectoration. She also reports a continuous runny nose, suggestive of postnasal drip.  The patient has been using Flonase and Astelin for allergies, but reports limited efficacy. She has previously used ipatropium but if not currently using.  In addition to the respiratory symptoms, the patient has a history of congestive heart failure and was transitioned from Lasix  to spironolactone  by her cardiologist. However, there is uncertainty about the patient's adherence to the spironolactone  regimen, as the medication appears to be missing from the home. Her son thinks they have have a refill on file at her pharmacy.   The patient's medication management has been further complicated by a recent incident where she may have consumed a week's worth of medication in one morning. Typical heart rate runs in the 50-60s per her son.   The patient also reports increased urinary frequency at night, waking up three to four times to urinate. She attributes this to high water  intake and denies any pain or discomfort during urination. No acute confusion, dizziness, lightheadedness.   In summary, the patient presents with a persistent cough and wheezing, likely related to COPD, and a potential medication management issue. The patient's adherence to her spironolactone  regimen is uncertain, raising concerns about fluid management in the context of her congestive heart failure.    08/09/2023 Discussed the use of AI scribe software for clinical note transcription with the patient, who gave verbal consent to proceed.  History of Present Illness   Brinly Maietta is an 83 year old female with heart failure, pulmonary hypertension, COPD, and chronic respiratory failure who presents for a follow-up visit.  She has chronic respiratory failure with hypoxia and uses oxygen  at night. Typically, she uses two liters of oxygen , although there was a recent adjustment to three liters, which she was unaware of. She has previously undergone overnight oximetry tests to monitor her oxygen  levels during sleep. No recent wheezing or significant respiratory issues have been reported.  She has COPD with an asthmatic component and uses Breztri  twice daily, having switched from Trelegy. She also uses a rescue inhaler as needed. No recent wheezing or significant cough, although she experienced a prolonged cough during the winter, which has since resolved. Occasional coughing is attributed to allergies. She is a former smoker with a 40 pack-year history.  She has heart failure, contributing to fatigue. No recent exacerbations or fluid retention have been noted. Her blood pressure readings have been described as 'perfect.' She takes spironolactone , half a tablet, and has furosemide  available as needed, though she has not required it recently. She dislikes frequent nighttime urination, which she  attributes to age and possibly medication. She takes aspirin  and has noticed increased bruising.      Assessment and Plan:   Assessment and Plan    Acute exacerbation of chronic bronchitis Acute exacerbation of chronic bronchitis with persistent cough and respiratory symptoms. Associated pleuritic chest pain. Symptoms have not fully resolved with doxycycline .  CTA scan recommended to rule out pneumonia or PE. - Prescribe ciprofloxacin  for 7 days, twice daily, to cover both respiratory and urinary tract infections. - Advise to monitor for tendonitis symptoms while on ciprofloxacin  and discontinue if severe pain occurs. - Recommend wearing oxygen  24/7 until symptoms improve and further evaluation is possible. - Advise to use nebulizer and complete current course of prednisone .   Chronic obstructive pulmonary disease (COPD) COPD with potential progression or acute exacerbation. Symptoms include persistent cough and daytime oxygen  requirements.  - Order CT angiography of the chest to evaluate for acute changes or progression of COPD and rule out pulmonary embolism due to low oxygen  levels and chest pain. - Recommend wearing oxygen  24/7 until further evaluation. - Advise to use nebulizer and complete current course of prednisone .   Urinary tract infection Recent urinary tract infection treated with antibiotics. Symptoms of altered mental status and fatigue have improved, indicating response to treatment.    11/18/2023- interim hx  Discussed the use of AI scribe software for clinical note transcription with the patient, who gave verbal consent to proceed.  History of Present Illness   Laquanda Bick is an 83 year old female with emphysema and coronary artery disease who presents with worsening respiratory symptoms and weakness. She is accompanied by her daughter, who is her primary caregiver.  She has been experiencing increased weakness, particularly when trying to stand unattended, which began last Thursday. She spends about 16 to 18 hours in bed due to fatigue and a lack of energy. Her  daughter notes that she has been struggling and 'all she wants to do is sleep.'  She has a history of emphysema and was recently found to have a developing pneumonia in the left upper lobe, as shown by a CT scan. She has been on ciprofloxacin , with two pills remaining, but experiences significant stomach pain, which she suspects may be related to the medication. She has been on four back-to-back antibiotics, including doxycycline , which did not fully resolve her symptoms. She has a persistent cough, sometimes productive of yellowish sputum, especially after using a nebulizer and flutter valve. She uses the nebulizer every four hours and takes Mucinex DM for the cough.  Her oxygen  saturation at home on continuous oxygen  is 94-95% on three liters of pulsed oxygen . Her heart rate is 75. Her urinary tract infection symptoms have resolved, although she experiences frequent nighttime urination. She was taken off spironolactone  for four days due to dehydration concerns. Her daughter notes a change in her drinking habits, with a preference for ginger ale over water, although she continues to drink water when prompted.  She has a history of coronary artery disease with severe four-vessel involvement. She recently completed a course of prednisone , finishing on Sunday or Monday, and has leukocytosis. She has a known allergy to amoxicillin. Her daughter reports that she has a decreased appetite and experiences stomach pain, which is described as being above and below the stomach and associated with swelling. She has had two solid bowel movements, with the last one occurring yesterday, and denies any blood in the stool.   Her daughter is concerned about her current health  status.     Allergies  Allergen Reactions   Amoxicillin Diarrhea and Nausea And Vomiting   Ceclor [Cefaclor] Hives and Rash    Immunization History  Administered Date(s) Administered   Fluad Quad(high Dose 65+) 05/31/2020, 01/03/2023    Influenza, Seasonal, Injecte, Preservative Fre 02/14/2012   PFIZER(Purple Top)SARS-COV-2 Vaccination 07/30/2019, 08/22/2019    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   Ventricular tachycardia (HCC) 10/27/2019    Tobacco History: Social History   Tobacco Use  Smoking Status Former   Current packs/day: 0.00   Average packs/day: 1.5 packs/day for 50.0 years (75.0 ttl pk-yrs)   Types: Cigarettes   Start date: 05/04/1949   Quit date: 05/05/1999   Years since quitting: 24.5  Smokeless Tobacco Never   Counseling given: Not Answered   Outpatient Medications Prior to Visit  Medication Sig Dispense Refill   albuterol  (PROVENTIL ) (2.5 MG/3ML) 0.083% nebulizer solution Take 3 mLs (2.5 mg total) by nebulization every 6 (six) hours as needed for wheezing or shortness of breath. 75 mL 12   aspirin  81 MG EC tablet Take 81 mg by mouth daily.     benzonatate  (TESSALON ) 200 MG capsule Take 1 capsule (200 mg total) by mouth 3 (three) times daily as needed for cough. (Patient not taking: Reported on 11/15/2023) 30 capsule 2   bisoprolol  (ZEBETA ) 10 MG tablet Take 1 tablet (10 mg total) by mouth daily. 30 tablet 3   budesonide -glycopyrrolate -formoterol  (BREZTRI  AEROSPHERE) 160-9-4.8 MCG/ACT AERO inhaler Inhale 2 puffs into the lungs in the morning and at bedtime. 10.7 g 11   cetirizine (ZYRTEC) 10 MG tablet Take 10 mg by mouth daily.     ciprofloxacin  (CIPRO ) 500 MG tablet Take 1 tablet (500 mg total) by mouth 2 (two) times daily. 14 tablet 0   furosemide  (LASIX ) 20 MG  tablet Take 1 tablet (20 mg total) by mouth as needed. 30 tablet 1   ipratropium (ATROVENT ) 0.03 % nasal spray Place 2 sprays into both nostrils 3 (three) times daily. 30 mL 5   levalbuterol (XOPENEX HFA) 45 MCG/ACT inhaler Inhale 2 puffs into the lungs as needed for wheezing or shortness of breath.     magnesium oxide (MAG-OX) 400 MG tablet Take 400 mg by mouth daily. (Patient not taking: Reported on 11/15/2023)     NON FORMULARY RELAXIUM as needed for sleep     rosuvastatin  (CRESTOR ) 20 MG tablet Take 1 tablet by mouth at bedtime.     Spacer/Aero-Holding Raguel FRENCH Use with inhaler 1 each 2   spironolactone  (ALDACTONE ) 25 MG tablet Take 0.5 tablets (12.5 mg total) by mouth daily. 45 tablet 3   Facility-Administered Medications Prior to Visit  Medication Dose Route Frequency Provider Last Rate Last Admin   albuterol  (PROVENTIL ) (2.5 MG/3ML) 0.083% nebulizer solution 2.5 mg  2.5 mg Nebulization Q4H Cobb, Comer GAILS, NP   2.5 mg at 10/13/23 1612      Review of Systems  Review of Systems  Constitutional:  Positive for fatigue. Negative for fever.  HENT:  Positive for congestion.  Respiratory:  Positive for cough.   Cardiovascular: Negative.      Physical Exam  There were no vitals taken for this visit. Physical Exam Constitutional:      Appearance: Normal appearance.  HENT:     Head: Normocephalic and atraumatic.  Cardiovascular:     Rate and Rhythm: Normal rate.  Pulmonary:     Breath sounds: Rhonchi present.     Comments: Coarse rhonchi left upper-middle lobe; O2 94-96% 3L pulsed  Musculoskeletal:        General: Normal range of motion.  Skin:    General: Skin is warm and dry.  Neurological:     General: No focal deficit present.     Mental Status: She is alert and oriented to person, place, and time. Mental status is at baseline.  Psychiatric:        Mood and Affect: Mood normal.        Behavior: Behavior normal.        Thought Content: Thought content normal.         Judgment: Judgment normal.      Lab Results:  CBC    Component Value Date/Time   WBC 10.9 (H) 05/11/2023 0941   RBC 4.61 05/11/2023 0941   HGB 13.8 05/11/2023 0941   HGB 14.2 05/21/2021 1015   HCT 42.4 05/11/2023 0941   HCT 43.2 05/21/2021 1015   PLT 193.0 05/11/2023 0941   PLT 239 05/21/2021 1015   MCV 91.9 05/11/2023 0941   MCV 92 05/21/2021 1015   MCH 29.5 10/09/2022 1255   MCHC 32.7 05/11/2023 0941   RDW 14.4 05/11/2023 0941   RDW 12.9 05/21/2021 1015   LYMPHSABS 2.2 05/11/2023 0941   LYMPHSABS 2.0 11/05/2020 1330   MONOABS 1.0 05/11/2023 0941   EOSABS 0.1 05/11/2023 0941   EOSABS 0.1 11/05/2020 1330   BASOSABS 0.0 05/11/2023 0941   BASOSABS 0.0 11/05/2020 1330    BMET    Component Value Date/Time   NA 137 05/11/2023 0941   NA 141 11/25/2020 1421   K 4.8 05/11/2023 0941   CL 102 05/11/2023 0941   CO2 28 05/11/2023 0941   GLUCOSE 93 05/11/2023 0941   BUN 40 (H) 05/11/2023 0941   BUN 24 11/25/2020 1421   CREATININE 1.01 05/11/2023 0941   CALCIUM  9.2 05/11/2023 0941   GFRNONAA 47 (L) 10/09/2022 1255    BNP No results found for: BNP  ProBNP    Component Value Date/Time   PROBNP 331.0 (H) 05/11/2023 0941    Imaging: CT Angio Chest Pulmonary Embolism (PE) W or WO Contrast Result Date: 11/15/2023 CLINICAL DATA:  Dyspnea, cardiac arrhythmia suspected, ischemia excluded Pulmonary embolism (PE) suspected, high prob EXAM: CT ANGIOGRAPHY CHEST WITH CONTRAST TECHNIQUE: Multidetector CT imaging of the chest was performed using the standard protocol during bolus administration of intravenous contrast. Multiplanar CT image reconstructions and MIPs were obtained to evaluate the vascular anatomy. RADIATION DOSE REDUCTION: This exam was performed according to the departmental dose-optimization program which includes automated exposure control, adjustment of the mA and/or kV according to patient size and/or use of iterative reconstruction technique. CONTRAST:  75mL  OMNIPAQUE  IOHEXOL  350 MG/ML SOLN COMPARISON:  CT chest 02/20/2022 FINDINGS: Cardiovascular: Satisfactory opacification of the pulmonary arteries to the segmental level. No evidence of pulmonary embolism. Normal heart size. No significant pericardial effusion. The thoracic aorta is normal in caliber. Severe atherosclerotic plaque of the thoracic aorta. Four-vessel coronary artery calcifications. Mediastinum/Nodes: No enlarged mediastinal, hilar, or axillary lymph nodes. Thyroid  gland,  trachea, and esophagus demonstrate no significant findings. Lungs/Pleura: Moderate paraseptal and centrilobular emphysematous changes. Similar-appearing reticulation most prominent along the paramediastinal bilateral upper lobes with associated architectural distortion. Interval development of lingular and left upper lobe patchy airspace opacity. Similar-appearing cluster of pulmonary micronodules within the right middle lobe. Stable right middle lobe 7 x 6 mm pulmonary nodule (6:70). Interval development of a right upper lobe 3 mm pulmonary nodule (6:28). Interval development of a couple of left apical pulmonary micronodules (6:24). No pulmonary mass. No pleural effusion. No pneumothorax. Upper Abdomen: No acute abnormality. Musculoskeletal: No chest wall abnormality. No suspicious lytic or blastic osseous lesions. No acute displaced fracture. Multilevel degenerative changes of the spine. Review of the MIP images confirms the above findings. IMPRESSION: 1. No acute pulmonary embolus. 2. Interval development of lingular and left upper lobe patchy airspace opacity. Question developing infection. Recommend follow-up CT in 3 months to evaluate for complete resolution. 3. Interval development of a right apical and two left apical pulmonary micronodules. No follow-up needed if patient is low-risk (and has no known or suspected primary neoplasm). Non-contrast chest CT can be considered in 12 months if patient is high-risk. This recommendation  follows the consensus statement: Guidelines for Management of Incidental Pulmonary Nodules Detected on CT Images: From the Fleischner Society 2017; Radiology 2017; 284:228-243. 4. Stable scattered pulmonary micronodules with cluster pulmonary micronodules within the right middle lobe as well as a stable 7 x 6 mm right middle lobe pulmonary nodule. No further follow-up indicated. 5. Emphysema (ICD10-J43.9) and underlying interstitial lung disease. 6. Aortic Atherosclerosis (ICD10-I70.0) -severe, including four-vessel coronary calcification. Electronically Signed   By: Morgane  Naveau M.D.   On: 11/15/2023 16:50     Assessment & Plan:   No problem-specific Assessment & Plan notes found for this encounter.  83 year old female, former smoker. PMH significant for CHF, pulm HTN, COPD/asthma. Patient current being treatment for left upper lobe pneumonia, failed outpatient treatment with doxycyline and ciprofloxacin . Increased oxygen  demand, fatigue/lethargy, worsening cough and leukocytosis. Patient recommend to proceed to ED for evaluation/admission. Deputy ED has been notify of arrival.   Assessment and Plan    Community acquired pneumonia Developing pneumonia in the left upper lobe with persistent cough, decreased appetite, and fatigue. Symptoms have not resolved with doxycycline  and ciprofloxacin . Elevated white blood cell count possibly due to ongoing infection. Ciprofloxacin  may contribute to tendonitis, patient sustained bicep rupture earlier this week. Advised patient stop Ciprofloxacin .  - Recommend hospital admission for IV antibiotics and further management  Goals of Care Discussion about hospital admission necessity due to outpatient treatment failure and potential decompensation. She is reluctant but acknowledges feeling unwell. Emphasized importance of admission for health and safety.  40 mins spent on case: >50% face to face with patient   Almarie LELON Ferrari, NP 11/18/2023

## 2023-11-18 NOTE — ED Provider Triage Note (Signed)
 Emergency Medicine Provider Triage Evaluation Note  Joan Smith , a 83 y.o. female  was evaluated in triage.  Pt complains of sob. Per daughter, pt has had fever, cough,sob, and fatigue ongoing x 1 month.  Has had 4 rounds of abx and additional treatment without improvement.  Pulmonologist recommend hospital admission for IV abx  Review of Systems  Positive: As above Negative: As above  Physical Exam  Ht 5' 3 (1.6 m)   Wt 62.1 kg   BMI 24.27 kg/m  Gen:   Awake, no distress   Resp:  Normal effort  MSK:   Moves extremities without difficulty  Other:    Medical Decision Making  Medically screening exam initiated at 1:11 PM.  Appropriate orders placed.  [name] was informed that the remainder of the evaluation will be completed by another provider, this initial triage assessment does not replace that evaluation, and the importance of remaining in the ED until their evaluation is complete.     [name], PA-C 11/18/23 1313

## 2023-11-19 ENCOUNTER — Inpatient Hospital Stay (HOSPITAL_COMMUNITY)

## 2023-11-19 DIAGNOSIS — J9621 Acute and chronic respiratory failure with hypoxia: Secondary | ICD-10-CM | POA: Diagnosis not present

## 2023-11-19 DIAGNOSIS — Z7189 Other specified counseling: Secondary | ICD-10-CM | POA: Diagnosis not present

## 2023-11-19 DIAGNOSIS — Z825 Family history of asthma and other chronic lower respiratory diseases: Secondary | ICD-10-CM | POA: Diagnosis not present

## 2023-11-19 DIAGNOSIS — Z1152 Encounter for screening for COVID-19: Secondary | ICD-10-CM | POA: Diagnosis not present

## 2023-11-19 DIAGNOSIS — I2510 Atherosclerotic heart disease of native coronary artery without angina pectoris: Secondary | ICD-10-CM | POA: Diagnosis not present

## 2023-11-19 DIAGNOSIS — I5042 Chronic combined systolic (congestive) and diastolic (congestive) heart failure: Secondary | ICD-10-CM | POA: Diagnosis not present

## 2023-11-19 DIAGNOSIS — Z515 Encounter for palliative care: Secondary | ICD-10-CM | POA: Diagnosis not present

## 2023-11-19 DIAGNOSIS — Z90710 Acquired absence of both cervix and uterus: Secondary | ICD-10-CM | POA: Diagnosis not present

## 2023-11-19 DIAGNOSIS — I110 Hypertensive heart disease with heart failure: Secondary | ICD-10-CM | POA: Diagnosis not present

## 2023-11-19 DIAGNOSIS — Z9981 Dependence on supplemental oxygen: Secondary | ICD-10-CM | POA: Diagnosis not present

## 2023-11-19 DIAGNOSIS — I5022 Chronic systolic (congestive) heart failure: Secondary | ICD-10-CM | POA: Diagnosis not present

## 2023-11-19 DIAGNOSIS — I425 Other restrictive cardiomyopathy: Secondary | ICD-10-CM | POA: Diagnosis not present

## 2023-11-19 DIAGNOSIS — J181 Lobar pneumonia, unspecified organism: Secondary | ICD-10-CM | POA: Diagnosis not present

## 2023-11-19 DIAGNOSIS — Z833 Family history of diabetes mellitus: Secondary | ICD-10-CM | POA: Diagnosis not present

## 2023-11-19 DIAGNOSIS — G4700 Insomnia, unspecified: Secondary | ICD-10-CM | POA: Diagnosis not present

## 2023-11-19 DIAGNOSIS — Z87891 Personal history of nicotine dependence: Secondary | ICD-10-CM | POA: Diagnosis not present

## 2023-11-19 DIAGNOSIS — J9611 Chronic respiratory failure with hypoxia: Secondary | ICD-10-CM | POA: Diagnosis not present

## 2023-11-19 DIAGNOSIS — F03918 Unspecified dementia, unspecified severity, with other behavioral disturbance: Secondary | ICD-10-CM | POA: Diagnosis not present

## 2023-11-19 DIAGNOSIS — F05 Delirium due to known physiological condition: Secondary | ICD-10-CM | POA: Diagnosis not present

## 2023-11-19 DIAGNOSIS — Z8249 Family history of ischemic heart disease and other diseases of the circulatory system: Secondary | ICD-10-CM | POA: Diagnosis not present

## 2023-11-19 DIAGNOSIS — J8410 Pulmonary fibrosis, unspecified: Secondary | ICD-10-CM | POA: Diagnosis not present

## 2023-11-19 DIAGNOSIS — J441 Chronic obstructive pulmonary disease with (acute) exacerbation: Secondary | ICD-10-CM | POA: Diagnosis not present

## 2023-11-19 DIAGNOSIS — J4540 Moderate persistent asthma, uncomplicated: Secondary | ICD-10-CM | POA: Diagnosis not present

## 2023-11-19 DIAGNOSIS — Z7982 Long term (current) use of aspirin: Secondary | ICD-10-CM | POA: Diagnosis not present

## 2023-11-19 DIAGNOSIS — J189 Pneumonia, unspecified organism: Secondary | ICD-10-CM | POA: Diagnosis not present

## 2023-11-19 DIAGNOSIS — J440 Chronic obstructive pulmonary disease with acute lower respiratory infection: Secondary | ICD-10-CM | POA: Diagnosis not present

## 2023-11-19 DIAGNOSIS — Z880 Allergy status to penicillin: Secondary | ICD-10-CM | POA: Diagnosis not present

## 2023-11-19 DIAGNOSIS — Z66 Do not resuscitate: Secondary | ICD-10-CM | POA: Diagnosis not present

## 2023-11-19 DIAGNOSIS — J439 Emphysema, unspecified: Secondary | ICD-10-CM | POA: Diagnosis not present

## 2023-11-19 LAB — CBC
HCT: 37.7 % (ref 36.0–46.0)
Hemoglobin: 11.7 g/dL — ABNORMAL LOW (ref 12.0–15.0)
MCH: 29.5 pg (ref 26.0–34.0)
MCHC: 31 g/dL (ref 30.0–36.0)
MCV: 95 fL (ref 80.0–100.0)
Platelets: 233 K/uL (ref 150–400)
RBC: 3.97 MIL/uL (ref 3.87–5.11)
RDW: 14.1 % (ref 11.5–15.5)
WBC: 8.8 K/uL (ref 4.0–10.5)
nRBC: 0 % (ref 0.0–0.2)

## 2023-11-19 LAB — COMPREHENSIVE METABOLIC PANEL WITH GFR
ALT: 13 U/L (ref 0–44)
AST: 17 U/L (ref 15–41)
Albumin: 2.3 g/dL — ABNORMAL LOW (ref 3.5–5.0)
Alkaline Phosphatase: 64 U/L (ref 38–126)
Anion gap: 11 (ref 5–15)
BUN: 15 mg/dL (ref 8–23)
CO2: 26 mmol/L (ref 22–32)
Calcium: 8.7 mg/dL — ABNORMAL LOW (ref 8.9–10.3)
Chloride: 99 mmol/L (ref 98–111)
Creatinine, Ser: 0.87 mg/dL (ref 0.44–1.00)
GFR, Estimated: >60 mL/min (ref >60)
Glucose, Bld: 143 mg/dL — ABNORMAL HIGH (ref 70–99)
Potassium: 4.5 mmol/L (ref 3.5–5.1)
Sodium: 136 mmol/L (ref 135–145)
Total Bilirubin: 0.4 mg/dL (ref 0.0–1.2)
Total Protein: 5.8 g/dL — ABNORMAL LOW (ref 6.5–8.1)

## 2023-11-19 LAB — PROCALCITONIN: Procalcitonin: 0.1 ng/mL

## 2023-11-19 MED ORDER — LORATADINE 10 MG PO TABS
10.0000 mg | ORAL_TABLET | Freq: Every day | ORAL | Status: DC
  Administered 2023-11-19 – 2023-11-22 (×4): 10 mg via ORAL
  Filled 2023-11-19 (×4): qty 1

## 2023-11-19 MED ORDER — OXYMETAZOLINE HCL 0.05 % NA SOLN
1.0000 | Freq: Two times a day (BID) | NASAL | Status: AC
  Administered 2023-11-19 – 2023-11-21 (×6): 1 via NASAL
  Filled 2023-11-19: qty 30

## 2023-11-19 MED ORDER — PANTOPRAZOLE SODIUM 40 MG PO TBEC
40.0000 mg | DELAYED_RELEASE_TABLET | Freq: Two times a day (BID) | ORAL | Status: DC
  Administered 2023-11-19 – 2023-11-22 (×7): 40 mg via ORAL
  Filled 2023-11-19 (×7): qty 1

## 2023-11-19 MED ORDER — TRAZODONE HCL 50 MG PO TABS
50.0000 mg | ORAL_TABLET | Freq: Every evening | ORAL | Status: DC | PRN
  Administered 2023-11-19: 50 mg via ORAL
  Filled 2023-11-19: qty 1

## 2023-11-19 MED ORDER — FLUTICASONE PROPIONATE 50 MCG/ACT NA SUSP
2.0000 | Freq: Every day | NASAL | Status: DC
  Administered 2023-11-19 – 2023-11-22 (×4): 2 via NASAL
  Filled 2023-11-19: qty 16

## 2023-11-19 MED ORDER — ALBUTEROL SULFATE (2.5 MG/3ML) 0.083% IN NEBU: 2.5000 mg | INHALATION_SOLUTION | RESPIRATORY_TRACT | Status: DC | PRN

## 2023-11-19 MED ORDER — HYDROCOD POLI-CHLORPHE POLI ER 10-8 MG/5ML PO SUER
5.0000 mL | Freq: Two times a day (BID) | ORAL | Status: DC | PRN
  Administered 2023-11-19 – 2023-11-20 (×2): 5 mL via ORAL
  Filled 2023-11-19 (×2): qty 5

## 2023-11-19 MED ORDER — IPRATROPIUM-ALBUTEROL 0.5-2.5 (3) MG/3ML IN SOLN
3.0000 mL | Freq: Three times a day (TID) | RESPIRATORY_TRACT | Status: DC
  Administered 2023-11-19 – 2023-11-21 (×7): 3 mL via RESPIRATORY_TRACT
  Filled 2023-11-19 (×9): qty 3

## 2023-11-19 MED ORDER — GUAIFENESIN ER 600 MG PO TB12
600.0000 mg | ORAL_TABLET | Freq: Two times a day (BID) | ORAL | Status: DC
  Administered 2023-11-19 – 2023-11-20 (×4): 600 mg via ORAL
  Filled 2023-11-19 (×4): qty 1

## 2023-11-19 NOTE — Evaluation (Signed)
 Modified Barium Swallow Study  Patient Details  Name: Joan Smith MRN: [id number] Date of Birth: 08/13/1940  Today's Date: 11/19/2023  Modified Barium Swallow completed.  Full report located under Chart Review in the Imaging Section.  History of Present Illness 83 yo female referred to ED by [facility] 7/17 for worsening lethargy/weakness and new dx of PNA. PMH includes: COPD, asthma, chronic hypoxic respiratory failure on 2L O2 at home, chronic cough for almost 6 months PTA (has completed several rounds of antibiotics/steroids), TIA, CAD< CHF, pulmonary fibrosis   Clinical Impression Pt has a very mild oropharyngeal dysphagia, some of which may be attributed to current cognitive status. She has brisk posterior lingual transit but does not always swallow all of the bolus, leaving a collection of residue in her oral cavity intermittently. She had significant difficulty with propulsion of the barium tablet with thin liquid wash, and when given a bolus of puree to facilitate clearance, pt then masticated the pill and swallowed the pieces. Pharyngeally she was reduced laryngeal elevation and laryngeal vestibule closure with penetration of thin and nectar thick liquids that is primarily transient (PAS 2, considered to be normal). There was more frank penetration x2 given thin liquids (one of which was when trying to clear the barium tablet). This did clear with a spontaneous throat clear, and no aspiration was observed. During esophageal sweep there was retention within the esophagus as well as some retrograde flow. Along with pt's symptoms of increasing heartburn and trouble with solids, this could be more suggestive of a primary esophageal dysphagia. Will leave on regular solids and thin liquids as tolerated. Pt and son were given education about esophageal and aspiration precautions to utilize.  Factors that may increase risk of adverse event in presence of aspiration Noe & Lianne 2021): Respiratory  or GI disease;Reduced cognitive function;Frail or deconditioned  Swallow Evaluation Recommendations Recommendations: PO diet PO Diet Recommendation: Regular;Thin liquids (Level 0) Liquid Administration via: Cup;Straw Medication Administration: Whole meds with liquid (as tolerated - crush PRN) Supervision: Patient able to self-feed;Full supervision/cueing for swallowing strategies Swallowing strategies  : Minimize environmental distractions;Slow rate;Small bites/sips;Follow solids with liquids Postural changes: Position pt fully upright for meals;Stay upright 30-60 min after meals Oral care recommendations: Oral care BID (2x/day) Recommended consults: Consider esophageal assessment      Leita SAILOR., M.A. CCC-SLP Acute Rehabilitation Services Office: [phone]  Secure chat preferred  11/19/2023,2:50 PM

## 2023-11-19 NOTE — Evaluation (Addendum)
 Occupational Therapy Evaluation Patient Details Name: Joan Smith MRN: [id number] DOB: 1941-03-18 Today's Date: 11/19/2023   History of Present Illness   Pt is a 83 y/o female presenting on 7/17 with SOB, persistent cough and pneumonia not responding to outpatient intervention. PMH includes: TIA, CAD, anemia, CHF, asthma, restrictive lung disease, pulmonary HTN, pumonary fibrosis, COPD, chronic respiratory failure with hypoxia.     Clinical Impressions PTA pt independent with ADLs, light IADLs and mobility. Admitted for above and presents with problem list below.  She is on 3L O2 via [place], VSS during session.  Patient pleasantly confused, disoriented to place; requires frequent cueing for redirection and safety.  Pt able to manage Adls and mobility in room using RW with gross min assist.  Hopeful she will progress well.  Based on performance today, pt will best benefit from continued OT services acutely and after dc at [facility] level (pending progress) to optimize independence and safety with ADLs, IADLs and mobility. If pt does not progress and remains confused, will have to look into <3hrs/day inpatient setting.        If plan is discharge home, recommend the following:   A little help with walking and/or transfers;A little help with bathing/dressing/bathroom;Direct supervision/assist for medications management;Direct supervision/assist for financial management;Supervision due to cognitive status;Assist for transportation;Assistance with cooking/housework     Functional Status Assessment   Patient has had a recent decline in their functional status and demonstrates the ability to make significant improvements in function in a reasonable and predictable amount of time.     Equipment Recommendations   BSC/3in1;Other (comment) (RW)     Recommendations for Other Services         Precautions/Restrictions   Precautions Precautions: Fall;Other (comment) Recall of  Precautions/Restrictions: Impaired Precaution/Restrictions Comments: watch O2 Restrictions Weight Bearing Restrictions Per Provider Order: No     Mobility Bed Mobility Overal bed mobility: Needs Assistance Bed Mobility: Supine to Sit     Supine to sit: Contact guard     General bed mobility comments: for safety    Transfers Overall transfer level: Needs assistance Equipment used: Rolling walker (2 wheels), None Transfers: Sit to/from Stand Sit to Stand: Min assist           General transfer comment: cueing for safety, hand placement; does better with RW due to posterior bias      Balance Overall balance assessment: Needs assistance Sitting-balance support: No upper extremity supported, Feet supported Sitting balance-Leahy Scale: Fair Sitting balance - Comments: contact guard for safety   Standing balance support: Bilateral upper extremity supported, No upper extremity supported, Single extremity supported, During functional activity Standing balance-Leahy Scale: Poor Standing balance comment: posterior bias, does better with UE support                           ADL either performed or assessed with clinical judgement   ADL Overall ADL's : Needs assistance/impaired     Grooming: Set up;Sitting           Upper Body Dressing : Sitting;Set up   Lower Body Dressing: Minimal assistance;Sit to/from stand Lower Body Dressing Details (indicate cue type and reason): able to don socks, min assist in standing Toilet Transfer: Minimal assistance;Rolling walker (2 wheels);+2 for safety/equipment Toilet Transfer Details (indicate cue type and reason): to reclienr         Functional mobility during ADLs: Minimal assistance;Rolling walker (2 wheels);+2 for safety/equipment  Vision   Vision Assessment?: No apparent visual deficits     Perception         Praxis         Pertinent Vitals/Pain Pain Assessment Pain Assessment: No/denies pain      Extremity/Trunk Assessment Upper Extremity Assessment Upper Extremity Assessment: Generalized weakness   Lower Extremity Assessment Lower Extremity Assessment: Defer to PT evaluation       Communication Communication Communication: No apparent difficulties   Cognition Arousal: Alert Behavior During Therapy: WFL for tasks assessed/performed Cognition: Cognition impaired   Orientation impairments: Place, Situation Awareness: Intellectual awareness intact, Online awareness impaired Memory impairment (select all impairments): Short-term memory, Working memory Attention impairment (select first level of impairment): Sustained attention Executive functioning impairment (select all impairments): Organization, Sequencing, Reasoning, Problem solving OT - Cognition Comments: pt oriented to self and time, reports location is her sons house but at times voices she is in a school as well.  daughter reports pt has not slept in >24 hrs.  Pt able to follow simple commands but requires redirection frequently as easily distracted.                 Following commands: Impaired Following commands impaired: Follows one step commands with increased time, Follows multi-step commands inconsistently     Cueing  General Comments   Cueing Techniques: Verbal cues  daughter and son at side; VSS on 3L [place]   Exercises     Shoulder Instructions      Home Living Family/patient expects to be discharged to:: Private residence Living Arrangements: Alone Available Help at Discharge: Family Type of Home: Apartment Home Access: Level entry     Home Layout: One level     Bathroom Shower/Tub: Producer, television/film/video: Handicapped height     Home Equipment: Shower seat - built in;Grab bars - tub/shower   Additional Comments: son takes her to all her drs appointments      Prior Functioning/Environment Prior Level of Function : Independent/Modified Independent              Mobility Comments: no AD ADLs Comments: was independent with ADls    OT Problem List: Decreased strength;Decreased activity tolerance;Impaired balance (sitting and/or standing);Decreased knowledge of precautions;Decreased knowledge of use of DME or AE;Decreased safety awareness;Decreased cognition;Cardiopulmonary status limiting activity   OT Treatment/Interventions: Self-care/ADL training;Therapeutic exercise;DME and/or AE instruction;Therapeutic activities;Patient/family education;Balance training;Cognitive remediation/compensation;Energy conservation      OT Goals(Current goals can be found in the care plan section)   Acute Rehab OT Goals Patient Stated Goal: get better OT Goal Formulation: With patient Time For Goal Achievement: 12/03/23 Potential to Achieve Goals: Good   OT Frequency:  Min 2X/week    Co-evaluation PT/OT/SLP Co-Evaluation/Treatment: Yes Reason for Co-Treatment: To address functional/ADL transfers;Other (comment) (act tolerance)   OT goals addressed during session: ADL's and self-care      AM-PAC OT 6 Clicks Daily Activity     Outcome Measure Help from another person eating meals?: A Little Help from another person taking care of personal grooming?: A Little Help from another person toileting, which includes using toliet, bedpan, or urinal?: A Lot Help from another person bathing (including washing, rinsing, drying)?: A Little Help from another person to put on and taking off regular upper body clothing?: A Little Help from another person to put on and taking off regular lower body clothing?: A Little 6 Click Score: 17   End of Session Equipment Utilized During Treatment: Rolling walker (2 wheels);Oxygen  (3L)  Nurse Communication: Mobility status;Precautions  Activity Tolerance: Patient tolerated treatment well Patient left: in chair;with call bell/phone within reach;with chair alarm set;with family/visitor present  OT Visit Diagnosis: Other  abnormalities of gait and mobility (R26.89);Muscle weakness (generalized) (M62.81);Other symptoms and signs involving cognitive function                Time: 1152-1227 OT Time Calculation (min): 35 min Charges:  OT General Charges $OT Visit: 1 Visit OT Evaluation $OT Eval Moderate Complexity: 1 Mod  Etta NOVAK, OT Acute Rehabilitation Services Office [phone] Secure Chat Preferred    Etta GORMAN [place] 11/19/2023, 1:16 PM

## 2023-11-19 NOTE — Evaluation (Signed)
 Physical Therapy Evaluation Patient Details Name: Joan Smith MRN: [id number] DOB: May 06, 1940 Today's Date: 11/19/2023  History of Present Illness  Pt is a 83 y/o female presenting on 7/17 with SOB, persistent cough and pneumonia not responding to outpatient intervention. PMH includes: TIA, CAD, anemia, CHF, asthma, restrictive lung disease, pulmonary HTN, pumonary fibrosis, COPD, chronic respiratory failure with hypoxia.  Clinical Impression  Pt presents with admitting diagnosis above. Co-treat with OT. Pt today was able to transfer to chair with Min A HHA. Pt today limited by cognitive deficits however pt daughter reports that pt has not slept in 28 hours. PTA pt was normally independent however has needed 24/7 assistance over the last 5 weeks due to illness per family. Recommend HHPT upon DC pending progression however if pt cognition does not improve then may need to look at SNF. PT will continue to follow.         If plan is discharge home, recommend the following: Supervision due to cognitive status;A little help with walking and/or transfers;A little help with bathing/dressing/bathroom;Assistance with cooking/housework;Assist for transportation;Help with stairs or ramp for entrance   Can travel by private vehicle        Equipment Recommendations None recommended by PT  Recommendations for Other Services       Functional Status Assessment Patient has had a recent decline in their functional status and demonstrates the ability to make significant improvements in function in a reasonable and predictable amount of time.     Precautions / Restrictions Precautions Precautions: Fall;Other (comment) Recall of Precautions/Restrictions: Impaired Precaution/Restrictions Comments: watch O2 Restrictions Weight Bearing Restrictions Per Provider Order: No      Mobility  Bed Mobility Overal bed mobility: Needs Assistance Bed Mobility: Supine to Sit     Supine to sit: Contact  guard     General bed mobility comments: for safety    Transfers Overall transfer level: Needs assistance Equipment used: Rolling walker (2 wheels), None Transfers: Sit to/from Stand Sit to Stand: Min assist           General transfer comment: cueing for safety, hand placement; does better with RW due to posterior bias    Ambulation/Gait               General Gait Details: Deferred for safety  Stairs            Wheelchair Mobility     Tilt Bed    Modified Rankin (Stroke Patients Only)       Balance Overall balance assessment: Needs assistance Sitting-balance support: No upper extremity supported, Feet supported Sitting balance-Leahy Scale: Fair Sitting balance - Comments: contact guard for safety   Standing balance support: Bilateral upper extremity supported, No upper extremity supported, Single extremity supported, During functional activity Standing balance-Leahy Scale: Poor Standing balance comment: posterior bias, does better with UE support                             Pertinent Vitals/Pain Pain Assessment Pain Assessment: No/denies pain    Home Living Family/patient expects to be discharged to:: Private residence Living Arrangements: Alone Available Help at Discharge: Family Type of Home: Apartment Home Access: Level entry       Home Layout: One level Home Equipment: Shower seat - built in;Grab bars - tub/shower Additional Comments: son takes her to all her drs appointments    Prior Function Prior Level of Function : Independent/Modified Independent  Mobility Comments: no AD ADLs Comments: was independent with ADls     Extremity/Trunk Assessment   Upper Extremity Assessment Upper Extremity Assessment: Generalized weakness    Lower Extremity Assessment Lower Extremity Assessment: Generalized weakness       Communication   Communication Communication: No apparent difficulties    Cognition  Arousal: Alert Behavior During Therapy: WFL for tasks assessed/performed                             Following commands: Impaired Following commands impaired: Follows one step commands with increased time, Follows multi-step commands inconsistently     Cueing Cueing Techniques: Verbal cues     General Comments General comments (skin integrity, edema, etc.): VSS on 3L    Exercises     Assessment/Plan    PT Assessment Patient needs continued PT services  PT Problem List Decreased strength;Decreased activity tolerance;Decreased range of motion;Decreased coordination;Decreased mobility;Decreased balance;Decreased cognition;Decreased knowledge of use of DME;Decreased safety awareness;Decreased knowledge of precautions;Cardiopulmonary status limiting activity       PT Treatment Interventions DME instruction;Gait training;Stair training;Functional mobility training;Therapeutic activities;Therapeutic exercise;Balance training;Neuromuscular re-education;Cognitive remediation;Patient/family education    PT Goals (Current goals can be found in the Care Plan section)       Frequency Min 2X/week     Co-evaluation PT/OT/SLP Co-Evaluation/Treatment: Yes Reason for Co-Treatment: To address functional/ADL transfers;Other (comment) (act tolerance) PT goals addressed during session: Mobility/safety with mobility;Proper use of DME OT goals addressed during session: ADL's and self-care       AM-PAC PT 6 Clicks Mobility  Outcome Measure Help needed turning from your back to your side while in a flat bed without using bedrails?: A Little Help needed moving from lying on your back to sitting on the side of a flat bed without using bedrails?: A Little Help needed moving to and from a bed to a chair (including a wheelchair)?: A Little Help needed standing up from a chair using your arms (e.g., wheelchair or bedside chair)?: A Little Help needed to walk in hospital room?: A  Little Help needed climbing 3-5 steps with a railing? : A Lot 6 Click Score: 17    End of Session Equipment Utilized During Treatment: Gait belt;Oxygen  Activity Tolerance: Patient tolerated treatment well Patient left: in chair;with call bell/phone within reach;with chair alarm set;with family/visitor present Nurse Communication: Mobility status PT Visit Diagnosis: Other abnormalities of gait and mobility (R26.89)    Time: 8847-8774 PT Time Calculation (min) (ACUTE ONLY): 33 min   Charges:   PT Evaluation $PT Eval Moderate Complexity: 1 Mod   PT General Charges $$ ACUTE PT VISIT: 1 Visit         Vansh Reckart B, PT, DPT Acute Rehab Services [phone]   Ada Woodbury 11/19/2023, 4:31 PM

## 2023-11-19 NOTE — Progress Notes (Incomplete)
Received report from Holly, RN.

## 2023-11-19 NOTE — Evaluation (Signed)
 Clinical/Bedside Swallow Evaluation Patient Details  Name: Joan Smith MRN: [id number] Date of Birth: 1941/03/10  Today's Date: 11/19/2023 Time: SLP Start Time (ACUTE ONLY): 1120 SLP Stop Time (ACUTE ONLY): 1156 SLP Time Calculation (min) (ACUTE ONLY): 36 min  Past Medical History:  Past Medical History:  Diagnosis Date   Altered mental status 09/13/2019   Asthma    Broken heart syndrome 2013   Carotid arterial disease (HCC)    CHF (congestive heart failure) (HCC)    COPD (chronic obstructive pulmonary disease) (HCC)    Hypertension    OSA (obstructive sleep apnea) 2014   Paroxysmal SVT (supraventricular tachycardia) (HCC)    Pulmonary fibrosis (HCC)    Pulmonary hypertension (HCC) 10/27/2019   PVC (premature ventricular contraction)    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 PMH includes: COPD, asthma, chronic hypoxic respiratory failure on 2L O2 at home, chronic cough for almost 6 months PTA (has completed several rounds of antibiotics/steroids), TIA, CAD< CHF, pulmonary fibrosis    Assessment / Plan / Recommendation  Clinical Impression  Pt describes increased heartburn PTA to the point that she has started avoiding some of her favorite foods. She has not seen a physician for management of this (takes Pepto at home). Her daughter says that she also has c/o solids getting stuck sometimes. Pt's mentation today is altered from her baseline (sometimes even seeing things in the room that are not there), but she still engages in self-feeding. Baseline coughing it present throughout PO trials but there are not additional signs of dysphagia observed. However, given multiple risk factors for dysphagia (and even aspiration), new  subjective symptoms of potential dysphagia, and recurrent PNA, recommend proceeding with MBS. Pt and daughter are in agreement - will proceed as soon as this can be scheduled with radiology. Will leave current diet in place pending completion but did start to review esophageal precautions.   SLP Visit Diagnosis: Dysphagia, unspecified (R13.10)    Aspiration Risk       Diet Recommendation Regular;Thin liquid    Liquid Administration via: Cup;Straw Medication Administration: Whole meds with liquid Supervision: Patient able to self feed;Intermittent supervision to cue for compensatory strategies Compensations: Minimize environmental distractions;Slow rate;Small sips/bites;Follow solids with liquid Postural Changes: Seated upright at 90 degrees;Remain upright for at least 30 minutes after po intake    Other  Recommendations Oral Care Recommendations: Oral care BID     Assistance Recommended at Discharge    Functional Status Assessment    Frequency and Duration            Prognosis        Swallow Study   General HPI: 83 yo female referred to ED by [facility] 7/17 for worsening lethargy/weakness and new dx of PNA. PMH includes: COPD, asthma, chronic hypoxic respiratory failure on 2L O2 at home, chronic cough for almost 6 months PTA (has completed several rounds of antibiotics/steroids), TIA, CAD< CHF, pulmonary fibrosis Type of Study: Bedside Swallow Evaluation Previous Swallow Assessment: none in chart Diet Prior to this Study: Regular;Thin liquids (Level 0) Temperature Spikes Noted: No Respiratory Status: Nasal cannula History of Recent Intubation: No Behavior/Cognition: Alert;Cooperative;Pleasant mood;Confused;Requires cueing Oral Cavity Assessment: Within Functional Limits Oral Care Completed by SLP: No Oral Cavity - Dentition: Dentures,  top;Missing dentition (missing dentition on bottom, says her lower partials do not fit well) Vision: Functional for self-feeding Self-Feeding  Abilities: Able to feed self Patient Positioning: Upright in bed Baseline Vocal Quality: Normal Volitional Cough: Strong Volitional Swallow: Able to elicit    Oral/Motor/Sensory Function Overall Oral Motor/Sensory Function: Within functional limits   Ice Chips Ice chips: Not tested   Thin Liquid Thin Liquid: Impaired Presentation: Cup;Self Fed;Straw Pharyngeal  Phase Impairments: Cough - Delayed    Nectar Thick Nectar Thick Liquid: Not tested   Honey Thick Honey Thick Liquid: Not tested   Puree Puree: Impaired Presentation: Self Fed;Spoon Pharyngeal Phase Impairments: Cough - Delayed   Solid     Solid: Impaired Presentation: Self Fed Pharyngeal Phase Impairments: Cough - Delayed      Leita SAILOR., M.A. CCC-SLP Acute Rehabilitation Services Office: [phone]  Secure chat preferred  11/19/2023,1:22 PM

## 2023-11-19 NOTE — Plan of Care (Addendum)
 Pt admitted to room 16 via stretcher. Daughter at bedside.

## 2023-11-19 NOTE — Progress Notes (Signed)
 PROGRESS NOTE        PATIENT DETAILS Name: Joan Smith Age: 83 y.o. Sex: female Date of Birth: [birth year] Admit Date: 11/18/2023 Admitting Physician Joan Smith Joan Smith  Brief Summary: Patient is a 83 y.o.  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  Significant events: 7/17>> admit to [facility]  Significant studies: 7/14>> CTA chest: No PE, left upper lobe opacity.  Significant microbiology data: 7/17>> COVID/influenza/RSV PCR: Negative 7/17>> blood culture: No growth  Procedures: None  Consults: None  Subjective: Continues to cough-green phlegm.  Objective: Vitals: Blood pressure (!) 139/112, pulse 63, temperature (!) 97.3 F (36.3 C), temperature source Oral, resp. rate 19, height 5' 4 (1.626 m), weight 61.5 kg, SpO2 95%.   Exam: Gen Exam:Alert awake-not in any distress HEENT:atraumatic, normocephalic Chest: Left mid lung rales but otherwise clear. CVS:S1S2 regular Abdomen:soft non tender, non distended Extremities:no edema Neurology: Non focal Skin: no rash  Pertinent Labs/Radiology:    Latest Ref Rng & Units 11/19/2023    5:39 AM 11/18/2023    1:16 PM 05/11/2023    9:41 AM  CBC  WBC 4.0 - 10.5 K/uL 8.8  15.5  10.9   Hemoglobin 12.0 - 15.0 g/dL 88.2  87.8  86.1   Hematocrit 36.0 - 46.0 % 37.7  40.0  42.4   Platelets 150 - 400 K/uL 233  235  193.0     Lab Results  Component Value Date   NA 136 11/19/2023   K 4.5 11/19/2023   CL 99 11/19/2023   CO2 26 11/19/2023      Assessment/Plan: Left lobar PNA Continue Rocephin/Zithromax Follow cultures Supportive care.  Chronic cough Ongoing for at least 6 months-predates this  recent PNA. Does acknowledge having some GERD like symptoms, also acknowledges having postnasal drip like symptoms. Will start Flonase/Afrin x 3 days/Claritin, and place on PPI twice daily SLP evaluation to ensure no occult aspiration issues given recurrent pneumonia/persistent cough. Already on antibiotics for pneumonia If no improvement-May need to touch base with PCCM As needed Tussionex for intractable cough.  COPD/asthma with chronic hypoxic respiratory failure-on home O2 2 L No wheezing-this appears stable Note-acute hypoxic respiratory failure ruled out. Continue bronchodilators Unclear while on prednisone -will begin taper.  Chronic HFmrEF  Appears stable-euvolemic Continue Aldactone  As needed Lasix  for any signs of volume overload.  History of frequent PVCs Continue bisoprolol  Telemetry monitoring  CAD No anginal symptoms Aspirin /statin/beta-blocker Monitor  Carotid artery stenosis Asymptomatic-nonobstructive Continue aspirin /statin.  Right apical/left apical pulmonary nodule-incidental finding on CTA chest on 7/14 Prior history of tobacco use Repeat noncontrast CT in 12 months.  Code status:   Code Status: Full Code   DVT Prophylaxis: enoxaparin  (LOVENOX ) injection 40 mg Start: 11/18/23 1800   Family Communication: Daughter at bedside   Disposition Plan: Status is: Observation The patient will require care spanning > 2 midnights and should be moved to inpatient because:Severity of illness   Planned Discharge Destination:Home   Diet: Diet Order             Diet Heart Room service appropriate? Yes; Fluid consistency: Thin  Diet effective now  Antimicrobial agents: Anti-infectives (From admission, onward)    Start     Dose/Rate Route Frequency Ordered Stop   11/19/23 1000  levofloxacin  (LEVAQUIN ) IVPB 750 mg  Status:  Discontinued        750 mg 100 mL/hr over 90 Minutes Intravenous Every 24 hours 11/18/23 1609 11/18/23  1616   11/19/23 1000  azithromycin (ZITHROMAX) 500 mg in sodium chloride  0.9 % 250 mL IVPB        500 mg 250 mL/hr over 60 Minutes Intravenous Every 24 hours 11/18/23 1616     11/19/23 0000  cefTRIAXone (ROCEPHIN) 1 g in sodium chloride  0.9 % 100 mL IVPB        1 g 200 mL/hr over 30 Minutes Intravenous Every 24 hours 11/18/23 1616 11/23/23 2359   11/18/23 1500  levofloxacin  (LEVAQUIN ) IVPB 750 mg        750 mg 100 mL/hr over 90 Minutes Intravenous  Once 11/18/23 1454 11/18/23 1718        MEDICATIONS: Scheduled Meds:  aspirin  EC  81 mg Oral Daily   bisoprolol   10 mg Oral Daily   budesonide -glycopyrrolate -formoterol   2 puff Inhalation BID   enoxaparin  (LOVENOX ) injection  40 mg Subcutaneous Q24H   fluticasone  2 spray Each Nare Daily   guaiFENesin  600 mg Oral BID   ipratropium-albuterol   3 mL Nebulization TID   loratadine  10 mg Oral Daily   oxymetazoline  1 spray Each Nare BID   pantoprazole  40 mg Oral BID   predniSONE   40 mg Oral Q breakfast   rosuvastatin   20 mg Oral QHS   sodium chloride  flush  3 mL Intravenous Q12H   spironolactone   12.5 mg Oral Daily   Continuous Infusions:  azithromycin 500 mg (11/19/23 0933)   cefTRIAXone (ROCEPHIN)  IV Stopped (11/19/23 0107)   PRN Meds:.acetaminophen  **OR** acetaminophen , albuterol , chlorpheniramine-HYDROcodone, polyethylene glycol   I have personally reviewed following labs and imaging studies  LABORATORY DATA: CBC: Recent Labs  Lab 11/18/23 1316 11/19/23 0539  WBC 15.5* 8.8  NEUTROABS 13.1*  --   HGB 12.1 11.7*  HCT 40.0 37.7  MCV 96.4 95.0  PLT 235 233    Basic Metabolic Panel: Recent Labs  Lab 11/18/23 1316 11/19/23 0539  NA 134* 136  K 4.2 4.5  CL 98 99  CO2 27 26  GLUCOSE 113* 143*  BUN 15 15  CREATININE 0.94 0.87  CALCIUM  8.4* 8.7*    GFR: Estimated Creatinine Clearance: 41.6 mL/min (by C-G formula based on SCr of 0.87 mg/dL).  Liver Function Tests: Recent Labs  Lab 11/19/23 0539  AST 17   ALT 13  ALKPHOS 64  BILITOT 0.4  PROT 5.8*  ALBUMIN 2.3*   No results for input(s): LIPASE, AMYLASE in the last 168 hours. No results for input(s): AMMONIA in the last 168 hours.  Coagulation Profile: No results for input(s): INR, PROTIME in the last 168 hours.  Cardiac Enzymes: No results for input(s): CKTOTAL, CKMB, CKMBINDEX, TROPONINI in the last 168 hours.  BNP (last 3 results) Recent Labs    05/11/23 0941  PROBNP 331.0*    Lipid Profile: No results for input(s): CHOL, HDL, LDLCALC, TRIG, CHOLHDL, LDLDIRECT in the last 72 hours.  Thyroid  Function Tests: No results for input(s): TSH, T4TOTAL, FREET4, T3FREE, THYROIDAB in the last 72 hours.  Anemia Panel: No results for input(s): VITAMINB12, FOLATE, FERRITIN, TIBC, IRON, RETICCTPCT in the last 72 hours.  Urine analysis: No results found for: COLORURINE, APPEARANCEUR, LABSPEC, PHURINE,  GLUCOSEU, HGBUR, BILIRUBINUR, KETONESUR, PROTEINUR, UROBILINOGEN, NITRITE, LEUKOCYTESUR  Sepsis Labs: Lactic Acid, Venous    Component Value Date/Time   LATICACIDVEN 1.0 11/18/2023 1353    MICROBIOLOGY: Recent Results (from the past 240 hours)  Resp panel by RT-PCR (RSV, Flu A&B, Covid) Anterior Nasal Swab     Status: None   Collection Time: 11/18/23  1:45 PM   Specimen: Anterior Nasal Swab  Result Value Ref Range Status   SARS Coronavirus 2 by RT PCR NEGATIVE NEGATIVE Final   Influenza A by PCR NEGATIVE NEGATIVE Final   Influenza B by PCR NEGATIVE NEGATIVE Final    Comment: (NOTE) The Xpert Xpress SARS-CoV-2/FLU/RSV plus assay is intended as an aid in the diagnosis of influenza from Nasopharyngeal swab specimens and should not be used as a sole basis for treatment. Nasal washings and aspirates are unacceptable for Xpert Xpress SARS-CoV-2/FLU/RSV testing.  Fact Sheet for Patients: BloggerCourse.com  Fact Sheet for  Healthcare Providers: SeriousBroker.it  This test is not yet approved or cleared by the [place]  FDA and has been authorized for detection and/or diagnosis of SARS-CoV-2 by FDA under an Emergency Use Authorization (EUA). This EUA will remain in effect (meaning this test can be used) for the duration of the COVID-19 declaration under Section 564(b)(1) of the Act, 21 U.S.C. section 360bbb-3(b)(1), unless the authorization is terminated or revoked.     Resp Syncytial Virus by PCR NEGATIVE NEGATIVE Final    Comment: (NOTE) Fact Sheet for Patients: BloggerCourse.com  Fact Sheet for Healthcare Providers: SeriousBroker.it  This test is not yet approved or cleared by the [place]  FDA and has been authorized for detection and/or diagnosis of SARS-CoV-2 by FDA under an Emergency Use Authorization (EUA). This EUA will remain in effect (meaning this test can be used) for the duration of the COVID-19 declaration under Section 564(b)(1) of the Act, 21 U.S.C. section 360bbb-3(b)(1), unless the authorization is terminated or revoked.  Performed at [facility], [address] [place], [place] [address]   Blood culture (routine x 2)     Status: None (Preliminary result)   Collection Time: 11/18/23  1:45 PM   Specimen: BLOOD  Result Value Ref Range Status   Specimen Description BLOOD SITE NOT SPECIFIED  Final   Special Requests   Final    BOTTLES DRAWN AEROBIC AND ANAEROBIC Blood Culture adequate volume   Culture   Final    NO GROWTH < 24 HOURS Performed at [facility], [address] [place], [place] [address]    Report Status PENDING  Incomplete    RADIOLOGY STUDIES/RESULTS: DG Chest 2 View Result Date: 11/18/2023 CLINICAL DATA:  Shortness of breath. EXAM: CHEST - 2 VIEW COMPARISON:  October 13, 2023. FINDINGS: Stable cardiomegaly. Right upper lobe scarring is again noted. Minimal right  basilar subsegmental atelectasis or scarring is noted. Mild left basilar opacity is noted concerning for atelectasis or infiltrate with associated effusion. Bony thorax is unremarkable. IMPRESSION: Mild left basilar opacity concerning for atelectasis or infiltrate with associated effusion. Aortic Atherosclerosis (ICD10-I70.0). Electronically Signed   By: Lynwood Landy Raddle M.D.   On: 11/18/2023 15:05     LOS: 0 days   Donalda Applebaum, Smith  [facility]    To contact the attending provider between 7A-7P or the covering provider during after hours 7P-7A, please log into the web site [email or web address] and access using universal [place] password for that web site. If you do not have the password, please call the hospital  operator.  11/19/2023, 9:44 AM

## 2023-11-19 NOTE — Care Management Obs Status (Signed)
 MEDICARE OBSERVATION STATUS NOTIFICATION   Patient Details  Name: Joan Smith MRN: [id number] Date of Birth: [birth year]   Medicare Observation Status Notification Given:  No  Patient daughter would not sign and refused to let her mother sign but Obs status was explained  Claretta Deed 11/19/2023, 11:15 AM

## 2023-11-20 ENCOUNTER — Inpatient Hospital Stay (HOSPITAL_COMMUNITY)

## 2023-11-20 DIAGNOSIS — Z7189 Other specified counseling: Secondary | ICD-10-CM | POA: Diagnosis not present

## 2023-11-20 DIAGNOSIS — J9621 Acute and chronic respiratory failure with hypoxia: Secondary | ICD-10-CM | POA: Diagnosis not present

## 2023-11-20 DIAGNOSIS — I425 Other restrictive cardiomyopathy: Secondary | ICD-10-CM | POA: Diagnosis not present

## 2023-11-20 DIAGNOSIS — J441 Chronic obstructive pulmonary disease with (acute) exacerbation: Secondary | ICD-10-CM | POA: Diagnosis not present

## 2023-11-20 DIAGNOSIS — Z515 Encounter for palliative care: Secondary | ICD-10-CM

## 2023-11-20 DIAGNOSIS — J189 Pneumonia, unspecified organism: Secondary | ICD-10-CM | POA: Diagnosis not present

## 2023-11-20 MED ORDER — AZITHROMYCIN 500 MG PO TABS
500.0000 mg | ORAL_TABLET | Freq: Every day | ORAL | Status: AC
  Administered 2023-11-20 – 2023-11-21 (×2): 500 mg via ORAL
  Filled 2023-11-20 (×2): qty 1

## 2023-11-20 MED ORDER — PREDNISONE 5 MG PO TABS
30.0000 mg | ORAL_TABLET | Freq: Every day | ORAL | Status: DC
  Administered 2023-11-20: 30 mg via ORAL
  Filled 2023-11-20: qty 2

## 2023-11-20 MED ORDER — IPRATROPIUM-ALBUTEROL 0.5-2.5 (3) MG/3ML IN SOLN: 3.0000 mL | Freq: Once | RESPIRATORY_TRACT | Status: DC

## 2023-11-20 MED ORDER — MELATONIN 5 MG PO TABS
10.0000 mg | ORAL_TABLET | Freq: Every day | ORAL | Status: DC
  Administered 2023-11-20 – 2023-11-21 (×2): 10 mg via ORAL
  Filled 2023-11-20 (×2): qty 2

## 2023-11-20 MED ORDER — GUAIFENESIN 100 MG/5ML PO LIQD
5.0000 mL | ORAL | Status: DC | PRN
  Administered 2023-11-21 (×2): 5 mL via ORAL
  Filled 2023-11-20 (×2): qty 5

## 2023-11-20 MED ORDER — BENZONATATE 100 MG PO CAPS
100.0000 mg | ORAL_CAPSULE | Freq: Three times a day (TID) | ORAL | Status: DC
  Administered 2023-11-21 – 2023-11-22 (×5): 100 mg via ORAL
  Filled 2023-11-20 (×6): qty 1

## 2023-11-20 MED ORDER — QUETIAPINE FUMARATE 25 MG PO TABS
25.0000 mg | ORAL_TABLET | Freq: Every day | ORAL | Status: DC
  Administered 2023-11-20 – 2023-11-21 (×2): 25 mg via ORAL
  Filled 2023-11-20 (×2): qty 1

## 2023-11-20 NOTE — Consult Note (Signed)
 Consultation Note Date: 11/20/2023   Patient Name: Joan Smith  DOB: [birth year]  MRN: [id number]  Age / Sex: 83 y.o., female  PCP: Keren [name], MD Referring Physician: Raenelle Donalda HERO, MD  Reason for Consultation: Establishing goals of care  HPI/Patient Profile: 83 y.o. female  with past medical history of  TIA, carotid artery disease, anemia, CHF, asthma, restrictive lung disease, pulmonary hypertension, pulmonary fibrosis, COPD, chronic respiratory failure with hypoxia  admitted on 11/18/2023 with persistent cough and pneumonia not responding to outpatient intervention.   Patient was seen by outpatient pulmonology and workup was completed which included CT 7/14 showing evidence for pneumonia of LUL and lingula. She is admitted for treatment of pneumonia, acute on chronic respiratory failure, COPD exacerbation.  PMT has been consulted to assist with goals of care conversation at family's request.  Clinical Assessment and Goals of Care:  I have reviewed medical records including EPIC notes, labs and imaging, assessed the patient and then discussed with patient's son Arley to discuss diagnosis prognosis, GOC, EOL wishes, disposition and options.  I introduced Palliative Medicine as specialized medical care for people living with serious illness. It focuses on providing relief from the symptoms and stress of a serious illness. The goal is to improve quality of life for both the patient and the family.  We discussed a brief life review of the patient and then focused on their current illness.   I attempted to elicit values and goals of care important to the patient.    Medical History Review and Understanding:  We discussed patient's acute illness in the context of their chronic comorbidities.   Social History: Patient has been widowed for the past 13 years and has taken her husband's death very hard. She is cared for by her son Arley and has a  daughter who lives in [place] , as well as a son who lives in [place] . She has 14 great-grandchildren. She moved here from [place]  about 4 years ago due to increased care needs and support from [place]. She is a Curator.   Functional and Nutritional State: Patient has been declined rapidly since February. Before then, she was seeing a trainer twice a week and now she is requiring help from her son picking her up about 70% of the time. She sleeps most of the day.   Advance Directives: A detailed discussion regarding advanced directives was had. Son notes that he has durable POA but no medical advance directives have been completed. Provided with copy of documentation and education on process of completion if patient's mental status improves and she is interested.  Code Status: DNR/DNI confirmed.  Discussion: Provided emotional support and therapeutic listening as patient's son shared his concerns for patient's worsening quality of life. She and her family do not fear death and have strong faith in God. She has hated being unable to get out of bed since arriving to the hospital and likely is not getting enough benefit to her QOL as a result of being here. Arley is a Education officer, environmental and has worked with many patients who benefited from palliative care and hospice services. He would like to have additional support for his mother, allowing nature to take its course without prolonging her life. Counseled on the appropriateness of hospice philosophy given the goals of care stated above. Discussed that palliative services would not be nearly as robust and would also not prevent further hospitalizations. I shared that she would likely be eligible and may benefit from re-certification of services  after 6 months of enrollment. He would like to speak with his sister about this. Arley shares that his brother is not very responsible and unlikely to participate.  MOST form was introduced and An extensive conversation was had,  covering concepts specific to code status, artifical feeding and hydration, continued IV antibiotics and rehospitalization. He will confer with his sister about preferred date/time to meet for continued discussions and hopes for his mother's participation after she finally gets the rest she has desperately needed.    The difference between aggressive medical intervention and comfort care was considered in light of the patient's goals of care. Hospice and Palliative Care services outpatient were explained and offered.   Discussed the importance of continued conversation with family and the medical providers regarding overall plan of care and treatment options, ensuring decisions are within the context of the patient's values and GOCs.   Questions and concerns were addressed.  Hard Choices booklet left for review. The family was encouraged to call with questions or concerns.  PMT will continue to support holistically.   SUMMARY OF RECOMMENDATIONS   -Continue DNR/DNI -Patient's son leaning towards hospice philosophy if patient is eligible, goal is to return home, let nature take its course and avoid further hospitalizations -MOST form introduced today -Ongoing GOC discussions -Psychosocial and emotional support provided -PMT will continue to follow and support   Prognosis:  Poor long-term prognosis given ongoing decline, several chronic comorbidities, advanced age  Discharge Planning: To Be Determined      Primary Diagnoses: Present on Admission:  Primary pulmonary hypertension (HCC)  Primary restrictive cardiomyopathy (HCC)  Occlusion of extracranial carotid artery, bilateral  Moderate persistent asthma  Fibrosis of lung (HCC)  Acute on chronic respiratory failure with hypoxia (HCC)  PNA (pneumonia)    Physical Exam Vitals and nursing note reviewed.  Constitutional:      General: She is sleeping. She is not in acute distress.    Appearance: She is ill-appearing.   Cardiovascular:     Rate and Rhythm: Normal rate.  Pulmonary:     Effort: Pulmonary effort is normal.     Vital Signs: BP (!) 112/50 (BP Location: Right Arm)   Pulse 81   Temp 97.8 F (36.6 C) (Oral)   Resp 20   Ht 5' 4 (1.626 m)   Wt 61.5 kg   SpO2 90%   BMI 23.27 kg/m  Pain Scale: 0-10   Pain Score: 0-No pain   SpO2: SpO2: 90 % O2 Device:SpO2: 90 % O2 Flow Rate: .O2 Flow Rate (L/min): 3 L/min    MDM: High   Danilynn Jemison SHAUNNA Fell, PA-C  Palliative Medicine Team Team phone # [phone]  Thank you for allowing the Palliative Medicine Team to assist in the care of this patient. Please utilize secure chat with additional questions, if there is no response within 30 minutes please call the above phone number.  Palliative Medicine Team providers are available by phone from 7am to 7pm daily and can be reached through the team cell phone.  Should this patient require assistance outside of these hours, please call the patient's attending physician.

## 2023-11-20 NOTE — Progress Notes (Addendum)
 PROGRESS NOTE        PATIENT DETAILS Name: Joan Smith Age: 83 y.o. Sex: female Date of Birth: 03/25/1941 Admit Date: 11/18/2023 Admitting Physician Joan CHRISTELLA Applebaum, MD ERE:Joan Smith, Joan BRAVO, MD  Brief Summary: Patient is a 83 y.o.  female with history of COPD/asthma, chronic hypoxic respiratory failure on 2 L of oxygen  at home-who has had chronic cough for almost 6 months-has completed several rounds of antibiotics/steroids-referred to be ED by [facility] office for worsening lethargy/weakness and new diagnosis of PNA on recent outpatient CT scan.  Significant events: 7/17>> admit to [facility]  Significant studies: 7/14>> CTA chest: No PE, left upper lobe opacity.  Significant microbiology data: 7/17>> COVID/influenza/RSV PCR: Negative 7/17>> blood culture: No growth  Procedures: None  Consults: None  Subjective: Could not sleep last night-was very restless-more confused than yesterday this morning.  Cough was much better yesterday but then started having more cough again this morning.  Objective: Vitals: Blood pressure (!) 112/50, pulse 81, temperature 97.8 F (36.6 C), temperature source Oral, resp. rate 20, height 5' 4 (1.626 m), weight 61.5 kg, SpO2 90%.   Exam: Confused but redirectable at times Not in any distress Chest: Moving air well-no rhonchi CVS: S1-S2 regular Abdomen: Soft nontender nondistended Extremities: No edema  Pertinent Labs/Radiology:    Latest Ref Rng & Units 11/19/2023    5:39 AM 11/18/2023    1:16 PM 05/11/2023    9:41 AM  CBC  WBC 4.0 - 10.5 K/uL 8.8  15.5  10.9   Hemoglobin 12.0 - 15.0 g/dL 88.2  87.8  86.1   Hematocrit 36.0 - 46.0 % 37.7  40.0  42.4   Platelets 150 - 400 K/uL 233  235  193.0     Lab Results  Component Value Date   NA 136 11/19/2023   K 4.5 11/19/2023   CL 99 11/19/2023   CO2 26 11/19/2023      Assessment/Plan: Left lobar PNA Afebrile Cough slowly improving per family Continue  Rocephin /Zithromax . All cultures negative so far.  Chronic cough Ongoing for at least 6 months-predates this recent PNA. Does acknowledge having some GERD like symptoms, also acknowledges having postnasal drip like symptoms hence on Afrin x 3 days/Flonase /Claritin  and PPI twice daily.  Remains on antibiotics for PNA that could be exacerbating her chronic cough. Appreciate SLP evaluation-no major issues with dysphagia With these measures-patient has somewhat stabilized-cough still present but much better-as she was significantly better yesterday and started again coughing today. Continue as needed Tussionex. If no improvement-May need to touch base with PCCM  COPD/asthma with chronic hypoxic respiratory failure-on home O2 2 L No wheezing-this appears stable Note-acute hypoxic respiratory failure ruled out. Continue bronchodilators Do not think she has exacerbation-hence will stop steroids today.  Chronic HFmrEF  Appears stable-euvolemic Continue Aldactone  As needed Lasix  for any signs of volume overload.  History of frequent PVCs Continue bisoprolol  Telemetry monitoring  CAD No anginal symptoms Aspirin /statin/beta-blocker Monitor  Carotid artery stenosis Asymptomatic-nonobstructive Continue aspirin /statin.  Right apical/left apical pulmonary nodule-incidental finding on CTA chest on 7/14 Prior history of tobacco use Repeat noncontrast CT in 12 months.  Probably dementia/cognitive dysfunction with hospital delirium More confused than usual-especially last night. Still very fidgety and confused this morning Starting low-dose Seroquel  Steroids have been stopped-hopefully this will help her symptoms as well. Delirium precautions.  Goals of care/palliative care/advance directives.  Long discussion with daughter initially and then son. Both of them acknowledged significant decline in patient's overall quality of life/functional status over the past several months. She  continues to have issues with cough (although improved) along with significant issues with insomnia and delirium. They feel that she may be suffering due to poor quality of life. Family requested palliative care consultation-I had a long discussion with them-for now-continue to treat reversible causes-both son/daughter indicated that the patient would not want aggressive resuscitative measures-and are okay with DNR.  Explained in great detail with the unless patient suffers a great setback/critical illness-she is unlikely to pass from this current hospitalization.  Both are interested in outpatient palliative care as well. In regards to insomnia-unfortunately trazodone  which was started last night-has not worked in the past and apparently causes her to get even more agitated.  This will be discontinued-given her ongoing issues with delirium-will try low-dose Seroquel  and melatonin instead.  Due to numerous alarms going off- that is causing the patient to be easily disturbed-I have discontinued telemetry monitoring as well. I have also encouraged the patient and family to use Tussionex as needed-which will not only help with the cough but also with some amount of sedation. Once her symptoms are adequately controlled-family is very keen on getting home as soon as possible.  Code status:   Code Status: Limited: Do not attempt resuscitation (DNR) -DNR-LIMITED -Do Not Intubate/DNI    DVT Prophylaxis: enoxaparin  (LOVENOX ) injection 40 mg Start: 11/18/23 1800   Family Communication: Daughter at bedside   Disposition Plan: Status is: Observation The patient will require care spanning > 2 midnights and should be moved to inpatient because:Severity of illness   Planned Discharge Destination:Home   Diet: Diet Order             Diet Heart Room service appropriate? Yes; Fluid consistency: Thin  Diet effective now                     Antimicrobial agents: Anti-infectives (From admission,  onward)    Start     Dose/Rate Route Frequency Ordered Stop   11/20/23 1000  azithromycin  (ZITHROMAX ) tablet 500 mg        500 mg Oral Daily 11/20/23 0859 11/22/23 0959   11/19/23 1000  levofloxacin  (LEVAQUIN ) IVPB 750 mg  Status:  Discontinued        750 mg 100 mL/hr over 90 Minutes Intravenous Every 24 hours 11/18/23 1609 11/18/23 1616   11/19/23 1000  azithromycin  (ZITHROMAX ) 500 mg in sodium chloride  0.9 % 250 mL IVPB  Status:  Discontinued        500 mg 250 mL/hr over 60 Minutes Intravenous Every 24 hours 11/18/23 1616 11/20/23 0859   11/19/23 0000  cefTRIAXone  (ROCEPHIN ) 1 g in sodium chloride  0.9 % 100 mL IVPB        1 g 200 mL/hr over 30 Minutes Intravenous Every 24 hours 11/18/23 1616 11/23/23 2359   11/18/23 1500  levofloxacin  (LEVAQUIN ) IVPB 750 mg        750 mg 100 mL/hr over 90 Minutes Intravenous  Once 11/18/23 1454 11/18/23 1718        MEDICATIONS: Scheduled Meds:  aspirin  EC  81 mg Oral Daily   azithromycin   500 mg Oral Daily   bisoprolol   10 mg Oral Daily   budesonide -glycopyrrolate -formoterol   2 puff Inhalation BID   enoxaparin  (LOVENOX ) injection  40 mg Subcutaneous Q24H   fluticasone   2 spray Each Nare Daily   guaiFENesin   600 mg Oral BID   ipratropium-albuterol   3 mL Nebulization TID   loratadine   10 mg Oral Daily   melatonin  10 mg Oral QHS   oxymetazoline   1 spray Each Nare BID   pantoprazole   40 mg Oral BID   predniSONE   30 mg Oral Q breakfast   QUEtiapine   25 mg Oral QHS   rosuvastatin   20 mg Oral QHS   sodium chloride  flush  3 mL Intravenous Q12H   spironolactone   12.5 mg Oral Daily   Continuous Infusions:  cefTRIAXone  (ROCEPHIN )  IV Stopped (11/20/23 0036)   PRN Meds:.acetaminophen  **OR** acetaminophen , albuterol , chlorpheniramine-HYDROcodone, polyethylene glycol   I have personally reviewed following labs and imaging studies  LABORATORY DATA: CBC: Recent Labs  Lab 11/18/23 1316 11/19/23 0539  WBC 15.5* 8.8  NEUTROABS 13.1*  --    HGB 12.1 11.7*  HCT 40.0 37.7  MCV 96.4 95.0  PLT 235 233    Basic Metabolic Panel: Recent Labs  Lab 11/18/23 1316 11/19/23 0539  NA 134* 136  K 4.2 4.5  CL 98 99  CO2 27 26  GLUCOSE 113* 143*  BUN 15 15  CREATININE 0.94 0.87  CALCIUM  8.4* 8.7*    GFR: Estimated Creatinine Clearance: 41.6 mL/min (by C-G formula based on SCr of 0.87 mg/dL).  Liver Function Tests: Recent Labs  Lab 11/19/23 0539  AST 17  ALT 13  ALKPHOS 64  BILITOT 0.4  PROT 5.8*  ALBUMIN 2.3*   No results for input(s): LIPASE, AMYLASE in the last 168 hours. No results for input(s): AMMONIA in the last 168 hours.  Coagulation Profile: No results for input(s): INR, PROTIME in the last 168 hours.  Cardiac Enzymes: No results for input(s): CKTOTAL, CKMB, CKMBINDEX, TROPONINI in the last 168 hours.  BNP (last 3 results) Recent Labs    05/11/23 0941  PROBNP 331.0*    Lipid Profile: No results for input(s): CHOL, HDL, LDLCALC, TRIG, CHOLHDL, LDLDIRECT in the last 72 hours.  Thyroid  Function Tests: No results for input(s): TSH, T4TOTAL, FREET4, T3FREE, THYROIDAB in the last 72 hours.  Anemia Panel: No results for input(s): VITAMINB12, FOLATE, FERRITIN, TIBC, IRON, RETICCTPCT in the last 72 hours.  Urine analysis: No results found for: COLORURINE, APPEARANCEUR, LABSPEC, PHURINE, GLUCOSEU, HGBUR, BILIRUBINUR, KETONESUR, PROTEINUR, UROBILINOGEN, NITRITE, LEUKOCYTESUR  Sepsis Labs: Lactic Acid, Venous    Component Value Date/Time   LATICACIDVEN 1.0 11/18/2023 1353    MICROBIOLOGY: Recent Results (from the past 240 hours)  Resp panel by RT-PCR (RSV, Flu A&B, Covid) Anterior Nasal Swab     Status: None   Collection Time: 11/18/23  1:45 PM   Specimen: Anterior Nasal Swab  Result Value Ref Range Status   SARS Coronavirus 2 by RT PCR NEGATIVE NEGATIVE Final   Influenza A by PCR NEGATIVE NEGATIVE Final   Influenza B  by PCR NEGATIVE NEGATIVE Final    Comment: (NOTE) The Xpert Xpress SARS-CoV-2/FLU/RSV plus assay is intended as an aid in the diagnosis of influenza from Nasopharyngeal swab specimens and should not be used as a sole basis for treatment. Nasal washings and aspirates are unacceptable for Xpert Xpress SARS-CoV-2/FLU/RSV testing.  Fact Sheet for Patients: BloggerCourse.com  Fact Sheet for Healthcare Providers: SeriousBroker.it  This test is not yet approved or cleared by the [place]  FDA and has been authorized for detection and/or diagnosis of SARS-CoV-2 by FDA under an Emergency Use Authorization (EUA). This EUA will remain in effect (meaning this test can be used) for the duration of  the COVID-19 declaration under Section 564(b)(1) of the Act, 21 U.S.C. section 360bbb-3(b)(1), unless the authorization is terminated or revoked.     Resp Syncytial Virus by PCR NEGATIVE NEGATIVE Final    Comment: (NOTE) Fact Sheet for Patients: BloggerCourse.com  Fact Sheet for Healthcare Providers: SeriousBroker.it  This test is not yet approved or cleared by the [place]  FDA and has been authorized for detection and/or diagnosis of SARS-CoV-2 by FDA under an Emergency Use Authorization (EUA). This EUA will remain in effect (meaning this test can be used) for the duration of the COVID-19 declaration under Section 564(b)(1) of the Act, 21 U.S.C. section 360bbb-3(b)(1), unless the authorization is terminated or revoked.  Performed at [facility], [address], [place], [place] [address]   Blood culture (routine x 2)     Status: None (Preliminary result)   Collection Time: 11/18/23  1:45 PM   Specimen: BLOOD  Result Value Ref Range Status   Specimen Description BLOOD SITE NOT SPECIFIED  Final   Special Requests   Final    BOTTLES DRAWN AEROBIC AND ANAEROBIC Blood Culture  adequate volume   Culture   Final    NO GROWTH 2 DAYS Performed at [facility], [address] [place], [place] [address]    Report Status PENDING  Incomplete  Blood culture (routine x 2)     Status: None (Preliminary result)   Collection Time: 11/19/23  5:39 AM   Specimen: BLOOD RIGHT ARM  Result Value Ref Range Status   Specimen Description BLOOD RIGHT ARM  Final   Special Requests   Final    BOTTLES DRAWN AEROBIC AND ANAEROBIC Blood Culture adequate volume   Culture   Final    NO GROWTH < 24 HOURS Performed at [facility], [address] [place], [place] [address]    Report Status PENDING  Incomplete    RADIOLOGY STUDIES/RESULTS: DG Swallowing Func-Speech Pathology Result Date: 11/19/2023 Table formatting from the original result was not included. Modified Barium Swallow Study Patient Details Name: Joan Smith MRN: [id number] Date of Birth: October 06, 1940 Today's Date: 11/19/2023 HPI/PMH: HPI: 83 yo female referred to ED by [facility] 7/17 for worsening lethargy/weakness and new dx of PNA. PMH includes: COPD, asthma, chronic hypoxic respiratory failure on 2L O2 at home, chronic cough for almost 6 months PTA (has completed several rounds of antibiotics/steroids), TIA, CAD< CHF, pulmonary fibrosis Clinical Impression: Clinical Impression: Pt has a very mild oropharyngeal dysphagia, some of which may be attributed to current cognitive status. She has brisk posterior lingual transit but does not always swallow all of the bolus, leaving a collection of residue in her oral cavity intermittently. She had significant difficulty with propulsion of the barium tablet with thin liquid wash, and when given a bolus of puree to facilitate clearance, pt then masticated the pill and swallowed the pieces. Pharyngeally she was reduced laryngeal elevation and laryngeal vestibule closure with penetration of thin and nectar thick liquids that is primarily transient (PAS 2, considered to be normal). There  was more frank penetration x2 given thin liquids (one of which was when trying to clear the barium tablet). This did clear with a spontaneous throat clear, and no aspiration was observed. During esophageal sweep there was retention within the esophagus as well as some retrograde flow. Along with pt's symptoms of increasing heartburn and trouble with solids, this could be more suggestive of a primary esophageal dysphagia. Will leave on regular solids and thin liquids  as tolerated. Pt and son were given education about esophageal and aspiration precautions to utilize. Factors that may increase risk of adverse event in presence of aspiration Noe & Lianne 2021): Factors that may increase risk of adverse event in presence of aspiration Noe & Lianne 2021): Respiratory or GI disease; Reduced cognitive function; Frail or deconditioned Recommendations/Plan: Swallowing Evaluation Recommendations Swallowing Evaluation Recommendations Recommendations: PO diet PO Diet Recommendation: Regular; Thin liquids (Level 0) Liquid Administration via: Cup; Straw Medication Administration: Whole meds with liquid (as tolerated - crush PRN) Supervision: Patient able to self-feed; Full supervision/cueing for swallowing strategies Swallowing strategies  : Minimize environmental distractions; Slow rate; Small bites/sips; Follow solids with liquids Postural changes: Position pt fully upright for meals; Stay upright 30-60 min after meals Oral care recommendations: Oral care BID (2x/day) Recommended consults: Consider esophageal assessment Treatment Plan Treatment Plan Treatment recommendations: Therapy as outlined in treatment plan below Follow-up recommendations: No SLP follow up Functional status assessment: Patient has had a recent decline in their functional status and demonstrates the ability to make significant improvements in function in a reasonable and predictable amount of time. Treatment frequency: Min 2x/week Treatment  duration: 2 weeks Interventions: Aspiration precaution training; Patient/family education; Diet toleration management by SLP Recommendations Recommendations for follow up therapy are one component of a multi-disciplinary discharge planning process, led by the attending physician.  Recommendations may be updated based on patient status, additional functional criteria and insurance authorization. Assessment: Orofacial Exam: Orofacial Exam Oral Cavity: Oral Hygiene: WFL Oral Cavity - Dentition: Dentures, top; Missing dentition (missing dentition on bottom, says her lower partials do not fit well) Orofacial Anatomy: WFL Oral Motor/Sensory Function: WFL Anatomy: Anatomy: Suspected cervical osteophytes Boluses Administered: Boluses Administered Boluses Administered: Thin liquids (Level 0); Mildly thick liquids (Level 2, nectar thick); Puree; Solid  Oral Impairment Domain: Oral Impairment Domain Lip Closure: No labial escape Tongue control during bolus hold: Posterior escape of greater than half of bolus (due to cognition) Bolus preparation/mastication: Timely and efficient chewing and mashing Bolus transport/lingual motion: Brisk tongue motion Oral residue: Residue collection on oral structures Location of oral residue : Floor of mouth; Lateral sulci; Tongue Initiation of pharyngeal swallow : Pyriform sinuses  Pharyngeal Impairment Domain: Pharyngeal Impairment Domain Soft palate elevation: No bolus between soft palate (SP)/pharyngeal wall (PW) Laryngeal elevation: Partial superior movement of thyroid  cartilage/partial approximation of arytenoids to epiglottic petiole Anterior hyoid excursion: Complete anterior movement Epiglottic movement: Complete inversion Laryngeal vestibule closure: Incomplete, narrow column air/contrast in laryngeal vestibule Pharyngeal stripping wave : Present - complete Pharyngeal contraction (A/P view only): N/A Pharyngoesophageal segment opening: Partial distention/partial duration, partial  obstruction of flow Tongue base retraction: Trace column of contrast or air between tongue base and PPW Pharyngeal residue: Trace residue within or on pharyngeal structures Location of pharyngeal residue: Valleculae; Pyriform sinuses  Esophageal Impairment Domain: Esophageal Impairment Domain Esophageal clearance upright position: Esophageal retention with retrograde flow below pharyngoesophageal segment (PES) Pill: Pill Consistency administered: Thin liquids (Level 0); Puree Thin liquids (Level 0): Impaired (see clinical impressions) Puree: Impaired (see clinical impressions) Penetration/Aspiration Scale Score: Penetration/Aspiration Scale Score 1.  Material does not enter airway: Puree; Solid; Pill 2.  Material enters airway, remains ABOVE vocal cords then ejected out: Mildly thick liquids (Level 2, nectar thick) 3.  Material enters airway, remains ABOVE vocal cords and not ejected out: Thin liquids (Level 0) Compensatory Strategies: Compensatory Strategies Compensatory strategies: Yes Liquid wash: Ineffective Ineffective Liquid Wash: Pill   General Information: Caregiver present: Yes (son)  Diet Prior  to this Study: Regular; Thin liquids (Level 0)   Temperature : Normal   Respiratory Status: WFL   Supplemental O2: Nasal cannula   History of Recent Intubation: No  Behavior/Cognition: Alert; Cooperative; Pleasant mood; Confused; Requires cueing Self-Feeding Abilities: Able to self-feed Baseline vocal quality/speech: Normal Volitional Cough: Able to elicit Volitional Swallow: Able to elicit Exam Limitations: No limitations Goal Planning: Prognosis for improved oropharyngeal function: Good Barriers to Reach Goals: Cognitive deficits No data recorded Patient/Family Stated Goal: none stated Consulted and agree with results and recommendations: Patient; Family member/caregiver (son) Pain: Pain Assessment Pain Assessment: No/denies pain Faces Pain Scale: 0 End of Session: Start Time:SLP Start Time (ACUTE ONLY): 1407 Stop  Time: SLP Stop Time (ACUTE ONLY): 1424 Time Calculation:SLP Time Calculation (min) (ACUTE ONLY): 17 min Charges: SLP Evaluations $ SLP Speech Visit: 1 Visit SLP Evaluations $BSS Swallow: 1 Procedure $MBS Swallow: 1 Procedure SLP visit diagnosis: SLP Visit Diagnosis: Dysphagia, unspecified (R13.10) Past Medical History: Past Medical History: Diagnosis Date  Altered mental status 09/13/2019  Asthma   Broken heart syndrome 2013  Carotid arterial disease (HCC)   CHF (congestive heart failure) (HCC)   COPD (chronic obstructive pulmonary disease) (HCC)   Hypertension   OSA (obstructive sleep apnea) 2014  Paroxysmal SVT (supraventricular tachycardia) (HCC)   Pulmonary fibrosis (HCC)   Pulmonary hypertension (HCC) 10/27/2019  PVC (premature ventricular contraction)   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 CCC-SLP Acute Rehabilitation Services Office: [phone] Secure chat preferred 11/19/2023, 2:50 PM  DG Chest 2 View Result Date: 11/18/2023 CLINICAL DATA:  Shortness of breath. EXAM: CHEST - 2 VIEW COMPARISON:  October 13, 2023. FINDINGS: Stable cardiomegaly. Right upper lobe scarring is again noted. Minimal right basilar subsegmental atelectasis or scarring is noted. Mild left basilar opacity is noted concerning for atelectasis or infiltrate with associated effusion. Bony thorax is unremarkable. IMPRESSION: Mild left basilar opacity concerning for atelectasis or infiltrate with associated effusion. Aortic Atherosclerosis (ICD10-I70.0). Electronically Signed   By: Lynwood Landy Raddle M.D.   On: 11/18/2023 15:05     LOS: 1 day   Joan Applebaum, MD  [facility]    To contact the attending provider between 7A-7P or the covering provider during after hours 7P-7A, please log into the web site [email or web address] and access using universal [place] password  for that web site. If you do not have the password, please call the hospital operator.  11/20/2023, 12:57 PM

## 2023-11-20 NOTE — Progress Notes (Signed)
   11/20/23 0950  Mobility  Activity Refused mobility (per daughter)  Mobility Specialist Start Time (ACUTE ONLY) [phone]   Mobility Specialist: Progress Note  Pt's daughter refused mobility session d/t pt being asleep. Daughter stated this is the first time she has been asleep since Thursday. - Received and left in bed with all needs met. Call bell within reach. MS will follow up as time permits.   Virgle Boards, BS Mobility Specialist Please contact via SecureChat or Rehab office at [phone].

## 2023-11-20 NOTE — Plan of Care (Signed)
  Problem: Education: Goal: Knowledge of General Education information will improve Description: Including pain rating scale, medication(s)/side effects and non-pharmacologic comfort measures Outcome: Progressing   Problem: Health Behavior/Discharge Planning: Goal: Ability to manage health-related needs will improve Outcome: Progressing   Problem: Clinical Measurements: Goal: Ability to maintain clinical measurements within normal limits will improve Outcome: Progressing Goal: Respiratory complications will improve Outcome: Progressing Goal: Cardiovascular complication will be avoided Outcome: Progressing   Problem: Activity: Goal: Risk for activity intolerance will decrease Outcome: Progressing   Problem: Safety: Goal: Ability to remain free from injury will improve Outcome: Progressing

## 2023-11-20 NOTE — Progress Notes (Signed)
PHARMACIST - PHYSICIAN COMMUNICATION DR:   Ghimire CONCERNING: Antibiotic IV to Oral Route Change Policy  RECOMMENDATION: This patient is receiving azithromycin by the intravenous route.  Based on criteria approved by the Pharmacy and Therapeutics Committee, the antibiotic(s) is/are being converted to the equivalent oral dose form(s).   DESCRIPTION: These criteria include: Patient being treated for a respiratory tract infection, urinary tract infection, cellulitis or clostridium difficile associated diarrhea if on metronidazole The patient is not neutropenic and does not exhibit a GI malabsorption state The patient is eating (either orally or via tube) and/or has been taking other orally administered medications for a least 24 hours The patient is improving clinically and has a Tmax < 100.5  If you have questions about this conversion, please contact the Pharmacy Department  []  ( [phone] )  [place] []  ( [phone] )  [facility] [x]  ( [phone]  [place] []  ( [phone] )  [facility] []  ( [phone] )  [facility]   

## 2023-11-21 DIAGNOSIS — J189 Pneumonia, unspecified organism: Secondary | ICD-10-CM | POA: Diagnosis not present

## 2023-11-21 DIAGNOSIS — Z7189 Other specified counseling: Secondary | ICD-10-CM | POA: Diagnosis not present

## 2023-11-21 DIAGNOSIS — I425 Other restrictive cardiomyopathy: Secondary | ICD-10-CM | POA: Diagnosis not present

## 2023-11-21 DIAGNOSIS — J441 Chronic obstructive pulmonary disease with (acute) exacerbation: Secondary | ICD-10-CM | POA: Diagnosis not present

## 2023-11-21 DIAGNOSIS — J9621 Acute and chronic respiratory failure with hypoxia: Secondary | ICD-10-CM | POA: Diagnosis not present

## 2023-11-21 LAB — CBC
HCT: 34.3 % — ABNORMAL LOW (ref 36.0–46.0)
Hemoglobin: 10.4 g/dL — ABNORMAL LOW (ref 12.0–15.0)
MCH: 29.5 pg (ref 26.0–34.0)
MCHC: 30.3 g/dL (ref 30.0–36.0)
MCV: 97.2 fL (ref 80.0–100.0)
Platelets: 192 K/uL (ref 150–400)
RBC: 3.53 MIL/uL — ABNORMAL LOW (ref 3.87–5.11)
RDW: 14.5 % (ref 11.5–15.5)
WBC: 10.7 K/uL — ABNORMAL HIGH (ref 4.0–10.5)
nRBC: 0 % (ref 0.0–0.2)

## 2023-11-21 LAB — BASIC METABOLIC PANEL WITH GFR
Anion gap: 10 (ref 5–15)
BUN: 18 mg/dL (ref 8–23)
CO2: 27 mmol/L (ref 22–32)
Calcium: 8.4 mg/dL — ABNORMAL LOW (ref 8.9–10.3)
Chloride: 102 mmol/L (ref 98–111)
Creatinine, Ser: 0.81 mg/dL (ref 0.44–1.00)
GFR, Estimated: >60 mL/min (ref >60)
Glucose, Bld: 125 mg/dL — ABNORMAL HIGH (ref 70–99)
Potassium: 4.2 mmol/L (ref 3.5–5.1)
Sodium: 139 mmol/L (ref 135–145)

## 2023-11-21 MED ORDER — HYDROCOD POLI-CHLORPHE POLI ER 10-8 MG/5ML PO SUER
5.0000 mL | Freq: Two times a day (BID) | ORAL | Status: DC | PRN
  Administered 2023-11-21: 5 mL via ORAL
  Filled 2023-11-21: qty 5

## 2023-11-21 NOTE — TOC Initial Note (Signed)
 Transition of Care [facility]) - Initial/Assessment Note    Patient Details  Name: Joan Smith MRN: [id number] Date of Birth: 06/02/1940  Transition of [facility] CM/SW Contact:    Marval Gell, RN Phone Number: 11/21/2023, 10:56 AM  Clinical Narrative:                  Patient admitted from home. Spoke w daughter at bedside. They would like [facility]SC for home this has been ordered through [employer]. Daughter would like to talk to her brother about [facility] agencies before they decide. ICM will follow up     Barriers to Discharge: Continued Medical Work up   Patient Goals and CMS Choice Patient states their goals for this hospitalization and ongoing recovery are:: to go home CMS Medicare.gov Compare Post Acute Care list provided to:: Patient Represenative (must comment) Choice offered to / list presented to : Adult Children      Expected Discharge Plan and Services In-house Referral: Clinical Social Work Discharge Planning Services: CM Consult                                          Prior Living Arrangements/Services                       Activities of Daily Living   ADL Screening (condition at time of admission) Independently performs ADLs?: No Does the patient have a NEW difficulty with bathing/dressing/toileting/self-feeding that is expected to last >3 days?: Yes (Initiates electronic notice to provider for possible OT consult) Does the patient have a NEW difficulty with getting in/out of bed, walking, or climbing stairs that is expected to last >3 days?: Yes (Initiates electronic notice to provider for possible PT consult) Does the patient have a NEW difficulty with communication that is expected to last >3 days?: No Is the patient deaf or have difficulty hearing?: No Does the patient have difficulty seeing, even when wearing glasses/contacts?: No Does the patient have difficulty concentrating, remembering, or making decisions?: Yes  Permission Sought/Granted                   Emotional Assessment              Admission diagnosis:  COPD exacerbation (HCC) [J44.1] Acute on chronic respiratory failure with hypoxia (HCC) [J96.21] Community acquired pneumonia, unspecified laterality [J18.9] PNA (pneumonia) [J18.9] Patient Active Problem List   Diagnosis Date Noted   PNA (pneumonia) 11/19/2023   CAP (community acquired pneumonia) 11/18/2023   COPD with acute exacerbation (HCC) 11/18/2023   Acute on chronic respiratory failure with hypoxia (HCC) 11/18/2023   Plantar wart 06/12/2021   Porokeratosis 06/12/2021   Chronic rhinitis 06/09/2021   Upper airway cough syndrome 06/09/2021   CHF (congestive heart failure) (HCC)    COPD (chronic obstructive pulmonary disease) (HCC) 10/03/2020   Chronic respiratory failure with hypoxia (HCC) 10/03/2020   History of TIA (transient ischemic attack) 05/30/2020   Iron deficiency anemia 05/16/2020   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   Lower extremity edema 10/27/2019   Occlusion of extracranial carotid artery, 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   Primary pulmonary hypertension (HCC) 10/27/2019   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/27/2019   Ex-cigarette smoker 09/13/2019   Fibrosis of lung (HCC) 09/13/2019   Moderate persistent asthma 09/13/2019   Fecal urgency 02/07/2018   Microscopic  hematuria 02/07/2018   Dyspnea 08/11/2017   PCP:  Keren [name], MD Pharmacy:   [facility] [phone] - [address], [place] - [phone] [place] [address] Phone: [phone] Fax: [phone]     Social Drivers of Health (SDOH) Social History: SDOH Screenings   Food Insecurity: No Food Insecurity (11/19/2023)  Housing: Low Risk  (11/19/2023)  Transportation Needs: No Transportation Needs (11/19/2023)  Utilities: Not At Risk (11/19/2023)  Social Connections: Moderately Integrated (11/19/2023)  Tobacco  Use: Medium Risk (11/18/2023)   SDOH Interventions:     Readmission Risk Interventions     No data to display

## 2023-11-21 NOTE — Plan of Care (Signed)

## 2023-11-21 NOTE — Progress Notes (Signed)
 Daily Progress Note   Patient Name: Joan Smith       Date: 11/21/2023 DOB: [birth year]  Age: 83 y.o. MRN#: [id number] Attending Physician: Raenelle Donalda HERO, MD Primary Care Physician: Keren [name], MD Admit Date: 11/18/2023  Reason for Consultation/Follow-up: Establishing goals of care  Subjective: Medical records reviewed including progress notes, labs, imaging.  Received voicemail from patient's son Joan Smith requesting 2 PM family meeting today.  I returned his call and shared that I would be present to continue goals of care discussions.  I then met with patient and family at the bedside. Created space and opportunity for patient and family's thoughts and feelings on patient's current illness. Patient shares that her quality of life could be better and she is not fearful of dying, but she is not ready to die. She looks forward to physical therapy and efforts to regain some strength. She is very clear that she never wants to return to the hospital.  I introduced Palliative Medicine as specialized medical care for people living with serious illness. It focuses on providing relief from the symptoms and stress of a serious illness. The goal is to improve quality of life for both the patient and the family.  The difference between aggressive medical intervention and comfort care was considered in light of the patient's goals of care. Hospice and Palliative Care services outpatient were explained and offered. Patient and family decided on palliative care.  Discussed the importance of continued conversation with family and the medical providers regarding overall plan of care and treatment options, ensuring decisions are within the context of the patient's values and GOCs.   An extensive conversation was had, covering concepts specific to code  status, artifical feeding and hydration, continued IV antibiotics and rehospitalization. Patient's wishes are as follows:  Cardiopulmonary Resuscitation: Do Not Attempt Resuscitation (DNR/No CPR)  Medical Interventions: Comfort Measures: Keep clean, warm, and dry. Use medication by any route, positioning, wound care, and other measures to relieve pain and suffering. Use oxygen , suction and manual treatment of airway obstruction as needed for comfort. Do not transfer to the hospital unless comfort needs cannot be met in current location.  Antibiotics: Determine use of limitation of antibiotics when infection occurs  IV Fluids: IV fluids for a defined trial period  Feeding Tube: No feeding tube     Questions and concerns addressed. PMT will continue to support holistically.   Length of Stay: 2   Physical Exam Vitals and nursing note reviewed.  Constitutional:      General: She is not in acute distress.    Appearance: She is ill-appearing.  Cardiovascular:     Rate and Rhythm: Normal rate.  Pulmonary:     Effort: Pulmonary effort is normal.  Skin:    General: Skin is warm and dry.  Neurological:     Mental Status: She is alert.     Comments: Oriented to person and year  Psychiatric:        Mood and Affect: Mood normal.        Behavior: Behavior normal.             Vital Signs: BP (!) 117/52 (BP Location: Right Arm)  Pulse 77   Temp 97.9 F (36.6 C) (Oral)   Resp 18   Ht 5' 4 (1.626 m)   Wt 61.5 kg   SpO2 90%   BMI 23.27 kg/m  SpO2: SpO2: 90 % O2 Device: O2 Device: Nasal Cannula O2 Flow Rate: O2 Flow Rate (L/min): 5 L/min      Palliative Assessment/Data: tbd   Palliative Care Assessment & Plan   Patient Profile: 83 y.o. female  with past medical history of  TIA, carotid artery disease, anemia, CHF, asthma, restrictive lung disease, pulmonary hypertension, pulmonary fibrosis, COPD, chronic respiratory failure with hypoxia  admitted on 11/18/2023 with persistent cough  and pneumonia not responding to outpatient intervention.    Patient was seen by outpatient pulmonology and workup was completed which included CT 7/14 showing evidence for pneumonia of LUL and lingula. She is admitted for treatment of pneumonia, acute on chronic respiratory failure, COPD exacerbation.   PMT has been consulted to assist with goals of care conversation at family's request  Assessment: Goals of care conversation  LUL PNA Debility Cognitive impairment   Recommendations/Plan: Continue DNR/DNI Continue current care plan Goal is to remain home with HH/palliative and avoid further hospitalizations. Patient and family would elect to transition to hospice services instead if she declined enough to warrant hospital stay  [facility] consulted for assistance with outpatient palliative care referral and daughter's questions regarding caregiver support private pay vs program for family to be paid Spiritual care consulted for AD completion tomorrow if mental status continues to improve MOST form completed today. Provided family with copy of   Prognosis: Poor long-term prognosis given ongoing decline, several chronic comorbidities, advanced age   Discharge Planning: Home with Home Health and palliative services    Total time: I spent 65 minutes in the care of the patient today in the above activities and documenting the encounter.          Joan Awan P Marletta Bousquet, PA-C  Palliative Medicine Team Team phone # [phone]  Thank you for allowing the Palliative Medicine Team to assist in the care of this patient. Please utilize secure chat with additional questions, if there is no response within 30 minutes please call the above phone number.  Palliative Medicine Team providers are available by phone from 7am to 7pm daily and can be reached through the team cell phone.  Should this patient require assistance outside of these hours, please call the patient's attending physician.

## 2023-11-21 NOTE — Progress Notes (Signed)
 Mobility Specialist: Progress Note   11/21/23 1440  Mobility  Activity Ambulated with assistance in hallway  Level of Assistance Contact guard assist, steadying assist  Assistive Device Front wheel walker  Distance Ambulated (ft) 300 ft  Activity Response Tolerated well  Mobility Referral Yes  Mobility visit 1 Mobility  Mobility Specialist Start Time (ACUTE ONLY) 1048  Mobility Specialist Stop Time (ACUTE ONLY) 1117  Mobility Specialist Time Calculation (min) (ACUTE ONLY) 29 min    Pt received in bed, pleasant and eager for OOB mobility. Daughter present at bedside and very helpful. SV for bed mobility. CGA for STS and ambulation. Received pt on 7LO2, titrated pt to 8LO2 for ambulation, SpO2 >90% throughout session. No complaints just fatigued by EOS. Also practiced transferring from C>BSC, completed transfers with CGA using RW. Returned pt to chair. Left in chair with all needs met, call bell in reach.   Ileana Lute Mobility Specialist Please contact via SecureChat or Rehab office at [phone]

## 2023-11-21 NOTE — Progress Notes (Signed)
 PROGRESS NOTE        PATIENT DETAILS Name: Joan Smith Age: 83 y.o. Sex: female Date of Birth: 1940-08-08 Admit Date: 11/18/2023 Admitting Physician Donalda CHRISTELLA Applebaum, MD ERE:[name], [name], MD  Brief Summary: Patient is a 83 y.o.  female with history of COPD/asthma, chronic hypoxic respiratory failure on 2 L of oxygen  at home-who has had chronic cough for almost 6 months-has completed several rounds of antibiotics/steroids-referred to be ED by [facility] office for worsening lethargy/weakness and new diagnosis of PNA on recent outpatient CT scan.  Significant events: 7/17>> admit to [facility]  Significant studies: 7/14>> CTA chest: No PE, left upper lobe opacity.  Significant microbiology data: 7/17>> COVID/influenza/RSV PCR: Negative 7/17>> blood culture: No growth  Procedures: None  Consults: None  Subjective: Finally slept last night after several nights of not sleeping.  Cough is minimal and much improved.  Much more coherent and calm and less restless today.  Objective: Vitals: Blood pressure (!) 117/52, pulse 77, temperature 97.9 F (36.6 C), temperature source Oral, resp. rate 18, height 5' 4 (1.626 m), weight 61.5 kg, SpO2 90%.   Exam: More awake and alert compared to yesterday  Not restless like yesterday Chest: Some bibasilar rales but mostly clear CVS: S1-S2 regular Abdomen: Soft nontender nondistended Extremities: No edema.  Pertinent Labs/Radiology:    Latest Ref Rng & Units 11/21/2023    7:04 AM 11/19/2023    5:39 AM 11/18/2023    1:16 PM  CBC  WBC 4.0 - 10.5 K/uL 10.7  8.8  15.5   Hemoglobin 12.0 - 15.0 g/dL 89.5  88.2  87.8   Hematocrit 36.0 - 46.0 % 34.3  37.7  40.0   Platelets 150 - 400 K/uL 192  233  235     Lab Results  Component Value Date   NA 139 11/21/2023   K 4.2 11/21/2023   CL 102 11/21/2023   CO2 27 11/21/2023      Assessment/Plan: Left lobar PNA Afebrile Cough much better Completed azithromycin  x  3 days Remains on Rocephin  x 5 days total.  Chronic cough Ongoing for at least 6 months-predates this recent PNA.  Much worse prior to this hospitalization. Started on antisinus therapy with Flonase /Afrin x 3 days/Claritin , anti-GERD therapy with PPI twice daily-and antibiotics for PNA-cough seems to have improved quite a bit.  Does have some chronic cough secondary to COPD issues at baseline. Appreciate SLP input-no major issues noted. Continue antitussives-continue to encourage to use Tussionex if she has severe cough.    COPD/asthma with chronic hypoxic respiratory failure-on home O2 2 L No wheezing-this appears stable Note-acute hypoxic respiratory failure ruled out. Continue bronchodilators No longer on steroids.  Chronic HFmrEF  Appears stable-euvolemic Continue Aldactone  As needed Lasix  for any signs of volume overload.  History of frequent PVCs Continue bisoprolol  Telemetry monitoring  CAD No anginal symptoms Aspirin /statin/beta-blocker Monitor  Carotid artery stenosis Asymptomatic-nonobstructive Continue aspirin /statin.  Right apical/left apical pulmonary nodule-incidental finding on CTA chest on 7/14 Prior history of tobacco use Repeat noncontrast CT in 12 months.  Probably dementia/cognitive dysfunction with hospital delirium Significantly less confused-less restless today Seems to have tolerated low-dose Seroquel /melatonin last night Continue delirium precautions  Insomnia Better with melatonin and Seroquel  combination-finally slept last night after 3 nights of not sleeping.  Goals of care/palliative care/advance directives. After extensive discussion with daughter/son-now DNR-plans are for  discharge with outpatient palliative care. Goals of care for general medical treatment and slowly to focus more on comfort.  No further hospitalization per family. Appreciate palliative care input.   Code status:   Code Status: Limited: Do not attempt resuscitation  (DNR) -DNR-LIMITED -Do Not Intubate/DNI    DVT Prophylaxis: enoxaparin  (LOVENOX ) injection 40 mg Start: 11/18/23 1800   Family Communication: Daughter at bedside   Disposition Plan: Status is: Observation The patient will require care spanning > 2 midnights and should be moved to inpatient because:Severity of illness   Planned Discharge Destination:Home   Diet: Diet Order             Diet Heart Room service appropriate? Yes; Fluid consistency: Thin  Diet effective now                     Antimicrobial agents: Anti-infectives (From admission, onward)    Start     Dose/Rate Route Frequency Ordered Stop   11/20/23 1000  azithromycin  (ZITHROMAX ) tablet 500 mg        500 mg Oral Daily 11/20/23 0859 11/22/23 0959   11/19/23 1000  levofloxacin  (LEVAQUIN ) IVPB 750 mg  Status:  Discontinued        750 mg 100 mL/hr over 90 Minutes Intravenous Every 24 hours 11/18/23 1609 11/18/23 1616   11/19/23 1000  azithromycin  (ZITHROMAX ) 500 mg in sodium chloride  0.9 % 250 mL IVPB  Status:  Discontinued        500 mg 250 mL/hr over 60 Minutes Intravenous Every 24 hours 11/18/23 1616 11/20/23 0859   11/19/23 0000  cefTRIAXone  (ROCEPHIN ) 1 g in sodium chloride  0.9 % 100 mL IVPB        1 g 200 mL/hr over 30 Minutes Intravenous Every 24 hours 11/18/23 1616 11/23/23 2359   11/18/23 1500  levofloxacin  (LEVAQUIN ) IVPB 750 mg        750 mg 100 mL/hr over 90 Minutes Intravenous  Once 11/18/23 1454 11/18/23 1718        MEDICATIONS: Scheduled Meds:  aspirin  EC  81 mg Oral Daily   azithromycin   500 mg Oral Daily   benzonatate   100 mg Oral TID   bisoprolol   10 mg Oral Daily   budesonide -glycopyrrolate -formoterol   2 puff Inhalation BID   enoxaparin  (LOVENOX ) injection  40 mg Subcutaneous Q24H   fluticasone   2 spray Each Nare Daily   ipratropium-albuterol   3 mL Nebulization TID   ipratropium-albuterol   3 mL Nebulization Once   loratadine   10 mg Oral Daily   melatonin  10 mg Oral QHS    oxymetazoline   1 spray Each Nare BID   pantoprazole   40 mg Oral BID   QUEtiapine   25 mg Oral QHS   rosuvastatin   20 mg Oral QHS   sodium chloride  flush  3 mL Intravenous Q12H   spironolactone   12.5 mg Oral Daily   Continuous Infusions:  cefTRIAXone  (ROCEPHIN )  IV Stopped (11/21/23 0035)   PRN Meds:.acetaminophen  **OR** acetaminophen , albuterol , guaiFENesin , polyethylene glycol   I have personally reviewed following labs and imaging studies  LABORATORY DATA: CBC: Recent Labs  Lab 11/18/23 1316 11/19/23 0539 11/21/23 0704  WBC 15.5* 8.8 10.7*  NEUTROABS 13.1*  --   --   HGB 12.1 11.7* 10.4*  HCT 40.0 37.7 34.3*  MCV 96.4 95.0 97.2  PLT 235 233 192    Basic Metabolic Panel: Recent Labs  Lab 11/18/23 1316 11/19/23 0539 11/21/23 0704  NA 134* 136 139  K 4.2 4.5  4.2  CL 98 99 102  CO2 27 26 27   GLUCOSE 113* 143* 125*  BUN 15 15 18   CREATININE 0.94 0.87 0.81  CALCIUM  8.4* 8.7* 8.4*    GFR: Estimated Creatinine Clearance: 44.6 mL/min (by C-G formula based on SCr of 0.81 mg/dL).  Liver Function Tests: Recent Labs  Lab 11/19/23 0539  AST 17  ALT 13  ALKPHOS 64  BILITOT 0.4  PROT 5.8*  ALBUMIN 2.3*   No results for input(s): LIPASE, AMYLASE in the last 168 hours. No results for input(s): AMMONIA in the last 168 hours.  Coagulation Profile: No results for input(s): INR, PROTIME in the last 168 hours.  Cardiac Enzymes: No results for input(s): CKTOTAL, CKMB, CKMBINDEX, TROPONINI in the last 168 hours.  BNP (last 3 results) Recent Labs    05/11/23 0941  PROBNP 331.0*    Lipid Profile: No results for input(s): CHOL, HDL, LDLCALC, TRIG, CHOLHDL, LDLDIRECT in the last 72 hours.  Thyroid  Function Tests: No results for input(s): TSH, T4TOTAL, FREET4, T3FREE, THYROIDAB in the last 72 hours.  Anemia Panel: No results for input(s): VITAMINB12, FOLATE, FERRITIN, TIBC, IRON, RETICCTPCT in the last 72  hours.  Urine analysis: No results found for: COLORURINE, APPEARANCEUR, LABSPEC, PHURINE, GLUCOSEU, HGBUR, BILIRUBINUR, KETONESUR, PROTEINUR, UROBILINOGEN, NITRITE, LEUKOCYTESUR  Sepsis Labs: Lactic Acid, Venous    Component Value Date/Time   LATICACIDVEN 1.0 11/18/2023 1353    MICROBIOLOGY: Recent Results (from the past 240 hours)  Resp panel by RT-PCR (RSV, Flu A&B, Covid) Anterior Nasal Swab     Status: None   Collection Time: 11/18/23  1:45 PM   Specimen: Anterior Nasal Swab  Result Value Ref Range Status   SARS Coronavirus 2 by RT PCR NEGATIVE NEGATIVE Final   Influenza A by PCR NEGATIVE NEGATIVE Final   Influenza B by PCR NEGATIVE NEGATIVE Final    Comment: (NOTE) The Xpert Xpress SARS-CoV-2/FLU/RSV plus assay is intended as an aid in the diagnosis of influenza from Nasopharyngeal swab specimens and should not be used as a sole basis for treatment. Nasal washings and aspirates are unacceptable for Xpert Xpress SARS-CoV-2/FLU/RSV testing.  Fact Sheet for Patients: BloggerCourse.com  Fact Sheet for Healthcare Providers: SeriousBroker.it  This test is not yet approved or cleared by the [place]  FDA and has been authorized for detection and/or diagnosis of SARS-CoV-2 by FDA under an Emergency Use Authorization (EUA). This EUA will remain in effect (meaning this test can be used) for the duration of the COVID-19 declaration under Section 564(b)(1) of the Act, 21 U.S.C. section 360bbb-3(b)(1), unless the authorization is terminated or revoked.     Resp Syncytial Virus by PCR NEGATIVE NEGATIVE Final    Comment: (NOTE) Fact Sheet for Patients: BloggerCourse.com  Fact Sheet for Healthcare Providers: SeriousBroker.it  This test is not yet approved or cleared by the [place]  FDA and has been authorized for detection and/or diagnosis of  SARS-CoV-2 by FDA under an Emergency Use Authorization (EUA). This EUA will remain in effect (meaning this test can be used) for the duration of the COVID-19 declaration under Section 564(b)(1) of the Act, 21 U.S.C. section 360bbb-3(b)(1), unless the authorization is terminated or revoked.  Performed at [facility], [address] [place], [place] [address]   Blood culture (routine x 2)     Status: None (Preliminary result)   Collection Time: 11/18/23  1:45 PM   Specimen: BLOOD  Result Value Ref Range Status   Specimen Description BLOOD SITE NOT SPECIFIED  Final  Special Requests   Final    BOTTLES DRAWN AEROBIC AND ANAEROBIC Blood Culture adequate volume   Culture   Final    NO GROWTH 3 DAYS Performed at [facility], [address] [place], [place] [address]    Report Status PENDING  Incomplete  Blood culture (routine x 2)     Status: None (Preliminary result)   Collection Time: 11/19/23  5:39 AM   Specimen: BLOOD RIGHT ARM  Result Value Ref Range Status   Specimen Description BLOOD RIGHT ARM  Final   Special Requests   Final    BOTTLES DRAWN AEROBIC AND ANAEROBIC Blood Culture adequate volume   Culture   Final    NO GROWTH 2 DAYS Performed at [facility], [address] [place], [place] [address]    Report Status PENDING  Incomplete    RADIOLOGY STUDIES/RESULTS: DG CHEST PORT 1 VIEW Result Date: 11/20/2023 CLINICAL DATA:  141880 SOB (shortness of breath) 141880 EXAM: PORTABLE CHEST 1 VIEW COMPARISON:  Chest x-ray 11/18/2023 FINDINGS: Question right to left mediastinal shift possibly due to volume loss on the left. The heart and mediastinal contours are unchanged. Atherosclerotic plaque. Left mid to lower lung zone density likely combination of lung disease and pleural effusion. Right base atelectasis. Chronic coarsened interstitial markings with no overt pulmonary edema. Trace right pleural effusion no pneumothorax. No acute osseous abnormality.  IMPRESSION: 1. Left mid to lower lung zone density likely combination of lung disease, atelectasis, and pleural effusion. Recommend checks x-ray repeat PA and lateral view for further evaluation. 2. Right base atelectasis.  Trace right pleural effusion. 3. Aortic Atherosclerosis (ICD10-I70.0) and Emphysema (ICD10-J43.9). Electronically Signed   By: Morgane  Naveau M.D.   On: 11/20/2023 22:16   DG Swallowing Func-Speech Pathology Result Date: 11/19/2023 Table formatting from the original result was not included. Modified Barium Swallow Study Patient Details Name: Anaiya Wisinski Stecklein MRN: [id number] Date of Birth: 20-Jul-1940 Today's Date: 11/19/2023 HPI/PMH: HPI: 83 yo female referred to ED by [facility] 7/17 for worsening lethargy/weakness and new dx of PNA. PMH includes: COPD, asthma, chronic hypoxic respiratory failure on 2L O2 at home, chronic cough for almost 6 months PTA (has completed several rounds of antibiotics/steroids), TIA, CAD< CHF, pulmonary fibrosis Clinical Impression: Clinical Impression: Pt has a very mild oropharyngeal dysphagia, some of which may be attributed to current cognitive status. She has brisk posterior lingual transit but does not always swallow all of the bolus, leaving a collection of residue in her oral cavity intermittently. She had significant difficulty with propulsion of the barium tablet with thin liquid wash, and when given a bolus of puree to facilitate clearance, pt then masticated the pill and swallowed the pieces. Pharyngeally she was reduced laryngeal elevation and laryngeal vestibule closure with penetration of thin and nectar thick liquids that is primarily transient (PAS 2, considered to be normal). There was more frank penetration x2 given thin liquids (one of which was when trying to clear the barium tablet). This did clear with a spontaneous throat clear, and no aspiration was observed. During esophageal sweep there was retention within the esophagus as well as some retrograde  flow. Along with pt's symptoms of increasing heartburn and trouble with solids, this could be more suggestive of a primary esophageal dysphagia. Will leave on regular solids and thin liquids as tolerated. Pt and son were given education about esophageal and aspiration precautions to utilize. Factors that may increase risk of adverse event in presence of aspiration Noe &  Lianne 2021): Factors that may increase risk of adverse event in presence of aspiration Noe & Lianne 2021): Respiratory or GI disease; Reduced cognitive function; Frail or deconditioned Recommendations/Plan: Swallowing Evaluation Recommendations Swallowing Evaluation Recommendations Recommendations: PO diet PO Diet Recommendation: Regular; Thin liquids (Level 0) Liquid Administration via: Cup; Straw Medication Administration: Whole meds with liquid (as tolerated - crush PRN) Supervision: Patient able to self-feed; Full supervision/cueing for swallowing strategies Swallowing strategies  : Minimize environmental distractions; Slow rate; Small bites/sips; Follow solids with liquids Postural changes: Position pt fully upright for meals; Stay upright 30-60 min after meals Oral care recommendations: Oral care BID (2x/day) Recommended consults: Consider esophageal assessment Treatment Plan Treatment Plan Treatment recommendations: Therapy as outlined in treatment plan below Follow-up recommendations: No SLP follow up Functional status assessment: Patient has had a recent decline in their functional status and demonstrates the ability to make significant improvements in function in a reasonable and predictable amount of time. Treatment frequency: Min 2x/week Treatment duration: 2 weeks Interventions: Aspiration precaution training; Patient/family education; Diet toleration management by SLP Recommendations Recommendations for follow up therapy are one component of a multi-disciplinary discharge planning process, led by the attending physician.   Recommendations may be updated based on patient status, additional functional criteria and insurance authorization. Assessment: Orofacial Exam: Orofacial Exam Oral Cavity: Oral Hygiene: WFL Oral Cavity - Dentition: Dentures, top; Missing dentition (missing dentition on bottom, says her lower partials do not fit well) Orofacial Anatomy: WFL Oral Motor/Sensory Function: WFL Anatomy: Anatomy: Suspected cervical osteophytes Boluses Administered: Boluses Administered Boluses Administered: Thin liquids (Level 0); Mildly thick liquids (Level 2, nectar thick); Puree; Solid  Oral Impairment Domain: Oral Impairment Domain Lip Closure: No labial escape Tongue control during bolus hold: Posterior escape of greater than half of bolus (due to cognition) Bolus preparation/mastication: Timely and efficient chewing and mashing Bolus transport/lingual motion: Brisk tongue motion Oral residue: Residue collection on oral structures Location of oral residue : Floor of mouth; Lateral sulci; Tongue Initiation of pharyngeal swallow : Pyriform sinuses  Pharyngeal Impairment Domain: Pharyngeal Impairment Domain Soft palate elevation: No bolus between soft palate (SP)/pharyngeal wall (PW) Laryngeal elevation: Partial superior movement of thyroid  cartilage/partial approximation of arytenoids to epiglottic petiole Anterior hyoid excursion: Complete anterior movement Epiglottic movement: Complete inversion Laryngeal vestibule closure: Incomplete, narrow column air/contrast in laryngeal vestibule Pharyngeal stripping wave : Present - complete Pharyngeal contraction (A/P view only): N/A Pharyngoesophageal segment opening: Partial distention/partial duration, partial obstruction of flow Tongue base retraction: Trace column of contrast or air between tongue base and PPW Pharyngeal residue: Trace residue within or on pharyngeal structures Location of pharyngeal residue: Valleculae; Pyriform sinuses  Esophageal Impairment Domain: Esophageal Impairment  Domain Esophageal clearance upright position: Esophageal retention with retrograde flow below pharyngoesophageal segment (PES) Pill: Pill Consistency administered: Thin liquids (Level 0); Puree Thin liquids (Level 0): Impaired (see clinical impressions) Puree: Impaired (see clinical impressions) Penetration/Aspiration Scale Score: Penetration/Aspiration Scale Score 1.  Material does not enter airway: Puree; Solid; Pill 2.  Material enters airway, remains ABOVE vocal cords then ejected out: Mildly thick liquids (Level 2, nectar thick) 3.  Material enters airway, remains ABOVE vocal cords and not ejected out: Thin liquids (Level 0) Compensatory Strategies: Compensatory Strategies Compensatory strategies: Yes Liquid wash: Ineffective Ineffective Liquid Wash: Pill   General Information: Caregiver present: Yes (son)  Diet Prior to this Study: Regular; Thin liquids (Level 0)   Temperature : Normal   Respiratory Status: WFL   Supplemental O2: Nasal cannula   History of Recent  Intubation: No  Behavior/Cognition: Alert; Cooperative; Pleasant mood; Confused; Requires cueing Self-Feeding Abilities: Able to self-feed Baseline vocal quality/speech: Normal Volitional Cough: Able to elicit Volitional Swallow: Able to elicit Exam Limitations: No limitations Goal Planning: Prognosis for improved oropharyngeal function: Good Barriers to Reach Goals: Cognitive deficits No data recorded Patient/Family Stated Goal: none stated Consulted and agree with results and recommendations: Patient; Family member/caregiver (son) Pain: Pain Assessment Pain Assessment: No/denies pain Faces Pain Scale: 0 End of Session: Start Time:SLP Start Time (ACUTE ONLY): 1407 Stop Time: SLP Stop Time (ACUTE ONLY): 1424 Time Calculation:SLP Time Calculation (min) (ACUTE ONLY): 17 min Charges: SLP Evaluations $ SLP Speech Visit: 1 Visit SLP Evaluations $BSS Swallow: 1 Procedure $MBS Swallow: 1 Procedure SLP visit diagnosis: SLP Visit Diagnosis: Dysphagia,  unspecified (R13.10) Past Medical History: Past Medical History: Diagnosis Date  Altered mental status 09/13/2019  Asthma   Broken heart syndrome 2013  Carotid arterial disease (HCC)   CHF (congestive heart failure) (HCC)   COPD (chronic obstructive pulmonary disease) (HCC)   Hypertension   OSA (obstructive sleep apnea) 2014  Paroxysmal SVT (supraventricular tachycardia) (HCC)   Pulmonary fibrosis (HCC)   Pulmonary hypertension (HCC) 10/27/2019  PVC (premature ventricular contraction)   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 CCC-SLP Acute Rehabilitation Services Office: [phone] Secure chat preferred 11/19/2023, 2:50 PM    LOS: 2 days   Donalda Applebaum, MD  [facility]    To contact the attending provider between 7A-7P or the covering provider during after hours 7P-7A, please log into the web site [email or web address] and access using universal [place] password for that web site. If you do not have the password, please call the hospital operator.  11/21/2023, 9:59 AM

## 2023-11-21 NOTE — Progress Notes (Signed)
 Mobility Specialist: Progress Note   11/21/23 1444  Mobility  Activity Transferred from chair to bed  Level of Assistance Contact guard assist, steadying assist  Assistive Device Front wheel walker  Activity Response Tolerated well  Mobility Referral Yes  Mobility visit 1 Mobility  Mobility Specialist Start Time (ACUTE ONLY) 1222  Mobility Specialist Stop Time (ACUTE ONLY) 1230  Mobility Specialist Time Calculation (min) (ACUTE ONLY) 8 min    Pt received in chair, requesting assistance back to bed. CGA throughout. No complaints. Left in bed with all needs met, call bell in reach.   Ileana Lute Mobility Specialist Please contact via SecureChat or Rehab office at [phone]

## 2023-11-22 ENCOUNTER — Other Ambulatory Visit (HOSPITAL_COMMUNITY): Payer: Self-pay

## 2023-11-22 DIAGNOSIS — Z7189 Other specified counseling: Secondary | ICD-10-CM | POA: Diagnosis not present

## 2023-11-22 DIAGNOSIS — J189 Pneumonia, unspecified organism: Secondary | ICD-10-CM | POA: Diagnosis not present

## 2023-11-22 DIAGNOSIS — Z515 Encounter for palliative care: Secondary | ICD-10-CM | POA: Diagnosis not present

## 2023-11-22 DIAGNOSIS — J9621 Acute and chronic respiratory failure with hypoxia: Secondary | ICD-10-CM | POA: Diagnosis not present

## 2023-11-22 DIAGNOSIS — J441 Chronic obstructive pulmonary disease with (acute) exacerbation: Secondary | ICD-10-CM | POA: Diagnosis not present

## 2023-11-22 DIAGNOSIS — I5042 Chronic combined systolic (congestive) and diastolic (congestive) heart failure: Secondary | ICD-10-CM | POA: Diagnosis not present

## 2023-11-22 MED ORDER — QUETIAPINE FUMARATE 25 MG PO TABS
25.0000 mg | ORAL_TABLET | Freq: Every day | ORAL | 0 refills | Status: DC
  Filled 2023-11-22: qty 30, 30d supply, fill #0

## 2023-11-22 MED ORDER — MELATONIN 5 MG PO TABS
10.0000 mg | ORAL_TABLET | Freq: Every day | ORAL | 11 refills | Status: DC
Start: 2023-11-22 — End: 2023-11-30
  Filled 2023-11-22: qty 60, 30d supply, fill #0

## 2023-11-22 MED ORDER — GUAIFENESIN 100 MG/5ML PO LIQD
5.0000 mL | ORAL | 0 refills | Status: DC | PRN
  Filled 2023-11-22: qty 120, 4d supply, fill #0

## 2023-11-22 MED ORDER — BISACODYL 10 MG RE SUPP: 10.0000 mg | Freq: Every day | RECTAL | Status: DC | PRN

## 2023-11-22 MED ORDER — PANTOPRAZOLE SODIUM 40 MG PO TBEC
40.0000 mg | DELAYED_RELEASE_TABLET | Freq: Two times a day (BID) | ORAL | 1 refills | Status: DC
  Filled 2023-11-22: qty 60, 30d supply, fill #0

## 2023-11-22 MED ORDER — FLUTICASONE PROPIONATE 50 MCG/ACT NA SUSP
2.0000 | Freq: Every day | NASAL | 2 refills | Status: DC
  Filled 2023-11-22: qty 16, 30d supply, fill #0

## 2023-11-22 MED ORDER — POLYETHYLENE GLYCOL 3350 17 GM/SCOOP PO POWD
17.0000 g | Freq: Every day | ORAL | 0 refills | Status: DC | PRN
Start: 2023-11-22 — End: 2023-11-30
  Filled 2023-11-22: qty 238, 14d supply, fill #0

## 2023-11-22 MED ORDER — SODIUM CHLORIDE 0.9 % IV SOLN
1.0000 g | Freq: Once | INTRAVENOUS | Status: AC
  Administered 2023-11-22: 1 g via INTRAVENOUS
  Filled 2023-11-22: qty 10

## 2023-11-22 MED ORDER — SENNOSIDES-DOCUSATE SODIUM 8.6-50 MG PO TABS
2.0000 | ORAL_TABLET | Freq: Every day | ORAL | Status: DC
  Administered 2023-11-22: 2 via ORAL
  Filled 2023-11-22: qty 2

## 2023-11-22 MED ORDER — POLYETHYLENE GLYCOL 3350 17 G PO PACK
17.0000 g | PACK | Freq: Every day | ORAL | Status: DC
  Administered 2023-11-22: 17 g via ORAL
  Filled 2023-11-22: qty 1

## 2023-11-22 MED ORDER — HYDROCOD POLI-CHLORPHE POLI ER 10-8 MG/5ML PO SUER
5.0000 mL | Freq: Two times a day (BID) | ORAL | 0 refills | Status: DC | PRN
  Filled 2023-11-22: qty 70, 7d supply, fill #0

## 2023-11-22 NOTE — Plan of Care (Signed)

## 2023-11-22 NOTE — Progress Notes (Addendum)
 AVS completed PIV removed. Tele dept called.  Extensive teaching with Palliative care and discharge instruction given to Nathanel and Arley (patient's children).  Palliative care information printed and given to family.  TOC medications given.  Seen by Chaplain.  Patient on  Oxygen . Transferred to Main entrance with RN.

## 2023-11-22 NOTE — Progress Notes (Signed)
 Daily Progress Note   Patient Name: Joan Smith       Date: 11/22/2023 DOB: 09-30-1940  Age: 83 y.o. MRN#: [id number] Attending Physician: Raenelle Donalda HERO, MD Primary Care Physician: Keren [name], MD Admit Date: 11/18/2023  Reason for Consultation/Follow-up: Establishing goals of care  Subjective: Medical records reviewed including progress notes, labs, imaging.  Patient assessed at the bedside.  No complaints or signs of pain/distress.  Her son is present visiting.  Provided additional education and counseling regarding advanced directives, reviewing documentation with patient and family.  Patient does not seem to be in the mood to complete, also getting confused at times throughout education.  Goal remains to discharge home with home health and palliative care, transition to hospice if she declines further.  No other needs at this time.  Questions and concerns addressed. PMT will continue to support holistically.   Length of Stay: 3   Physical Exam Vitals and nursing note reviewed.  Constitutional:      General: She is not in acute distress.    Appearance: She is ill-appearing.  Cardiovascular:     Rate and Rhythm: Normal rate.  Pulmonary:     Effort: Pulmonary effort is normal.  Skin:    General: Skin is warm and dry.  Neurological:     Mental Status: She is alert.     Comments: Oriented to person and year  Psychiatric:        Mood and Affect: Mood normal.        Behavior: Behavior normal.             Vital Signs: BP (!) 117/47 (BP Location: Left Arm)   Pulse 72   Temp 97.6 F (36.4 C) (Oral)   Resp 18   Ht 5' 4 (1.626 m)   Wt 61.5 kg   SpO2 91%   BMI 23.27 kg/m  SpO2: SpO2: 91 % O2 Device: O2 Device: Nasal Cannula O2 Flow Rate: O2 Flow Rate (L/min): 3 L/min      Palliative Assessment/Data:  tbd   Palliative Care Assessment & Plan   Patient Profile: 83 y.o. female  with past medical history of  TIA, carotid artery disease, anemia, CHF, asthma, restrictive lung disease, pulmonary hypertension, pulmonary fibrosis, COPD, chronic respiratory failure with hypoxia  admitted on 11/18/2023 with persistent cough and pneumonia not responding to outpatient intervention.    Patient was seen by outpatient pulmonology and workup was completed which included CT 7/14 showing evidence for pneumonia of LUL and lingula. She is admitted for treatment of pneumonia, acute on chronic respiratory failure, COPD exacerbation.   PMT has been consulted to assist with goals of care conversation at family's request  Assessment: Goals of care conversation  LUL PNA Debility Cognitive impairment   Recommendations/Plan: Continue DNR/DNI Continue current care plan Goal is to remain home with HH/palliative and avoid further hospitalizations. Patient and family would elect to transition to hospice services with further decline TOC previously consulted for assistance with outpatient palliative care referral  Defer advance directives given patient's confusion today PMT remains available as needed   Prognosis: Poor long-term prognosis given ongoing decline, several chronic comorbidities, advanced age   Discharge Planning: Home with  Home Health and palliative services    Total time: I spent 35 minutes in the care of the patient today in the above activities and documenting the encounter.          Fate Caster P Nickol Collister, PA-C  Palliative Medicine Team Team phone # [phone]  Thank you for allowing the Palliative Medicine Team to assist in the care of this patient. Please utilize secure chat with additional questions, if there is no response within 30 minutes please call the above phone number.  Palliative Medicine Team providers are available by phone from 7am to 7pm daily and can be reached through  the team cell phone.  Should this patient require assistance outside of these hours, please call the patient's attending physician.

## 2023-11-22 NOTE — TOC Transition Note (Signed)
 Transition of Care [facility]) - Discharge Note   Patient Details  Name: Joan Smith MRN: [id number] Date of Birth: 05/28/1940  Transition of Care [facility]) CM/SW Contact:  Robynn Eileen Hoose, RN Phone Number: 11/22/2023, 10:25 AM   Clinical Narrative:   Patient is being discharged home today. Spoke with son and daughter (on phone) at bedside. HH agency choice list given to son and discussed with daughter. [facility] is [facility] with [facility] able to accept. Family declined need for [facility] due to patient having elevated toilet at home and ability to have easy access to it. Family confirmed that they have a RW at home.     Final next level of care: Home w Home Health Services Barriers to Discharge: No Barriers Identified   Patient Goals and CMS Choice Patient states their goals for this hospitalization and ongoing recovery are:: to go home CMS Medicare.gov Compare Post Acute Care list provided to:: Patient Represenative (must comment) Choice offered to / list presented to : Adult Children      Discharge Placement                       Discharge Plan and Services Additional resources added to the After Visit Summary for   In-house Referral: Clinical Social Work Discharge Planning Services: CM Consult            DME Arranged: Patient refused services         HH Arranged: PT, OT HH Agency: [facility] Date [facility] Agency Contacted: 11/22/23 Time HH Agency Contacted: 1011 Representative spoke with at [facility] Agency: [facility]  Social Drivers of Health (SDOH) Interventions SDOH Screenings   Food Insecurity: No Food Insecurity (11/19/2023)  Housing: Low Risk  (11/19/2023)  Transportation Needs: No Transportation Needs (11/19/2023)  Utilities: Not At Risk (11/19/2023)  Social Connections: Moderately Integrated (11/19/2023)  Tobacco Use: Medium Risk (11/18/2023)     Readmission Risk Interventions     No data to display

## 2023-11-22 NOTE — Progress Notes (Signed)
 Speech Language Pathology Treatment: Dysphagia  Patient Details Name: Joan Smith MRN: [id number] DOB: 12-Jul-1940 Today's Date: 11/22/2023 Time: 8966-8957 SLP Time Calculation (min) (ACUTE ONLY): 9 min  Assessment / Plan / Recommendation Clinical Impression  SLP conducted skilled therapy session targeting dysphagia goals. SLP conducted skilled assessment of regular/thin liquid diet tolerance. Patient and son both in room and report no difficulty across all breakfast items earlier in the morning. SLP provided patient with thin liquids via cup/straw and graham crackers. Across all items, patient tolerated without difficulty nor overt s/sx of penetration/aspiration utilizing slow rate independently. Recommend continuation of current diet. No further SLP services warranted, SLP will sign off.  Patient was left in room with call bell in reach and alarm set.    HPI HPI: 83 yo female referred to ED by [facility] 7/17 for worsening lethargy/weakness and new dx of PNA. PMH includes: COPD, asthma, chronic hypoxic respiratory failure on 2L O2 at home, chronic cough for almost 6 months PTA (has completed several rounds of antibiotics/steroids), TIA, CAD< CHF, pulmonary fibrosis      SLP Plan  All goals met;Discharge SLP treatment due to (comment)          Recommendations  Diet recommendations: Regular;Thin liquid Liquids provided via: Straw Medication Administration: Whole meds with liquid Supervision: Patient able to self feed Compensations: Minimize environmental distractions;Slow rate;Small sips/bites;Follow solids with liquid Postural Changes and/or Swallow Maneuvers: Seated upright 90 degrees                  Oral care BID   PRN Dysphagia, unspecified (R13.10)     All goals met;Discharge SLP treatment due to (comment)    Rosina Downy, M.A., CCC-SLP  Doha Boling A [place]  11/22/2023, 1:41 PM

## 2023-11-22 NOTE — Progress Notes (Signed)
 Physical Therapy Treatment Patient Details Name: Joan Smith MRN: [id number] DOB: 12/18/1940 Today's Date: 11/22/2023   History of Present Illness Pt is a 83 y/o female presenting on 7/17 with SOB, persistent cough and pneumonia not responding to outpatient intervention. PMH includes: TIA, CAD, anemia, CHF, asthma, restrictive lung disease, pulmonary HTN, pumonary fibrosis, COPD, chronic respiratory failure with hypoxia.    PT Comments  Pt tolerated treatment well today. Pt today was able to progress ambulation in hallway with RW mostly CGA. Constant cues for proximity to RW. 2 minor LOB noted requiring Min A to correct. SpO2 down to 79% on 4L however was quickly able to recover to 93% on 3L once seated. Family present and all questions answered about mobility and safety at home. No change in DC/DME recs at this time. Pt anticipates DC home today.    If plan is discharge home, recommend the following: Supervision due to cognitive status;A little help with walking and/or transfers;A little help with bathing/dressing/bathroom;Assistance with cooking/housework;Assist for transportation;Help with stairs or ramp for entrance   Can travel by private vehicle        Equipment Recommendations  None recommended by PT    Recommendations for Other Services       Precautions / Restrictions Precautions Precautions: Fall;Other (comment) Recall of Precautions/Restrictions: Impaired Precaution/Restrictions Comments: watch O2 Restrictions Weight Bearing Restrictions Per Provider Order: No     Mobility  Bed Mobility               General bed mobility comments: Up in recliner    Transfers Overall transfer level: Needs assistance Equipment used: Rolling walker (2 wheels), None Transfers: Sit to/from Stand Sit to Stand: Min assist           General transfer comment: cueing for hand placement and safety. Min A to power up.    Ambulation/Gait Ambulation/Gait assistance: Contact  guard assist, Min assist, +2 safety/equipment Gait Distance (Feet): 150 Feet Assistive device: Rolling walker (2 wheels) Gait Pattern/deviations: Trunk flexed, Drifts right/left, Staggering left, Staggering right, Decreased stride length, Step-through pattern Gait velocity: decreased     General Gait Details: Constant cues for proximity to RW. 2 minor LOB noted requiring Min A to correct. SpO2 down to 79% on 4L however was quickly able to recover to 93% on 3L once seated.   Stairs             Wheelchair Mobility     Tilt Bed    Modified Rankin (Stroke Patients Only)       Balance Overall balance assessment: Needs assistance Sitting-balance support: No upper extremity supported, Feet supported Sitting balance-Leahy Scale: Good     Standing balance support: Bilateral upper extremity supported, No upper extremity supported, During functional activity Standing balance-Leahy Scale: Poor Standing balance comment: relies on RW                            Communication Communication Communication: No apparent difficulties  Cognition Arousal: Alert Behavior During Therapy: WFL for tasks assessed/performed                             Following commands: Impaired Following commands impaired: Follows multi-step commands inconsistently, Follows one step commands with increased time    Cueing Cueing Techniques: Verbal cues  Exercises      General Comments General comments (skin integrity, edema, etc.): Pt received on 3L at 91%. Pt  titrated to 4L for ambulation however desat to 79%. Pt was able to quickly recover to 93% on 3L once seated.      Pertinent Vitals/Pain Pain Assessment Pain Assessment: Faces Faces Pain Scale: Hurts little more Pain Location: generalized Pain Descriptors / Indicators: Discomfort, Grimacing, Guarding Pain Intervention(s): Limited activity within patient's tolerance, Monitored during session, Repositioned    Home  Living                          Prior Function            PT Goals (current goals can now be found in the care plan section) Progress towards PT goals: Progressing toward goals    Frequency    Min 2X/week      PT Plan      Co-evaluation              AM-PAC PT 6 Clicks Mobility   Outcome Measure  Help needed turning from your back to your side while in a flat bed without using bedrails?: A Little Help needed moving from lying on your back to sitting on the side of a flat bed without using bedrails?: A Little Help needed moving to and from a bed to a chair (including a wheelchair)?: A Little Help needed standing up from a chair using your arms (e.g., wheelchair or bedside chair)?: A Little Help needed to walk in hospital room?: A Little Help needed climbing 3-5 steps with a railing? : A Lot 6 Click Score: 17    End of Session Equipment Utilized During Treatment: Gait belt;Oxygen  Activity Tolerance: Patient tolerated treatment well Patient left: in chair;with call bell/phone within reach;with chair alarm set;with family/visitor present Nurse Communication: Mobility status PT Visit Diagnosis: Other abnormalities of gait and mobility (R26.89)     Time: 1047-1106 PT Time Calculation (min) (ACUTE ONLY): 19 min  Charges:    $Gait Training: 8-22 mins PT General Charges $$ ACUTE PT VISIT: 1 Visit                     Joan Smith, PT, DPT Acute Rehab Services [phone]    Joan Smith 11/22/2023, 11:15 AM

## 2023-11-22 NOTE — Progress Notes (Signed)
 Occupational Therapy Treatment Patient Details Name: Joan Smith MRN: [id number] DOB: March 01, 1941 Today's Date: 11/22/2023   History of present illness Pt is a 83 y/o female presenting on 7/17 with SOB, persistent cough and pneumonia not responding to outpatient intervention. PMH includes: TIA, CAD, anemia, CHF, asthma, restrictive lung disease, pulmonary HTN, pumonary fibrosis, COPD, chronic respiratory failure with hypoxia.   OT comments  Pt progressing well towards OT goals.  Pt requiring contact guard for transfers and mobility using RW in room, cueing for safety and RW mgmt throughout session. She requires contact guard for LB dressing and grooming at sink standing.  Spo2 desaturating when OOB (to 84%) and needing up to 5L during activities to maintain SpO2 90-91%, at end of session at rest able to return to 3L when in recliner.  Pts son present and reports she will have 24/7 support at dc.  Continue to recommend HHOT services at dc with hands on support for ADLs and mobility.        If plan is discharge home, recommend the following:  A little help with walking and/or transfers;A little help with bathing/dressing/bathroom;Direct supervision/assist for medications management;Direct supervision/assist for financial management;Supervision due to cognitive status;Assist for transportation;Assistance with cooking/housework   Equipment Recommendations  BSC/3in1;Other (comment) (RW)    Recommendations for Other Services      Precautions / Restrictions Precautions Precautions: Fall;Other (comment) Recall of Precautions/Restrictions: Impaired Precaution/Restrictions Comments: watch O2 Restrictions Weight Bearing Restrictions Per Provider Order: No       Mobility Bed Mobility Overal bed mobility: Needs Assistance Bed Mobility: Supine to Sit     Supine to sit: Supervision          Transfers Overall transfer level: Needs assistance Equipment used: Rolling walker (2 wheels),  None Transfers: Sit to/from Stand Sit to Stand: Contact guard assist           General transfer comment: cueing for hand placement and safety     Balance Overall balance assessment: Needs assistance Sitting-balance support: No upper extremity supported, Feet supported Sitting balance-Leahy Scale: Good     Standing balance support: Bilateral upper extremity supported, No upper extremity supported, During functional activity Standing balance-Leahy Scale: Poor Standing balance comment: relies on RW                           ADL either performed or assessed with clinical judgement   ADL Overall ADL's : Needs assistance/impaired     Grooming: Contact guard assist;Wash/dry hands;Standing               Lower Body Dressing: Contact guard assist;Sit to/from stand   Toilet Transfer: Contact guard assist;Ambulation;Rolling walker (2 wheels)           Functional mobility during ADLs: Contact guard assist;Rolling walker (2 wheels)      Extremity/Trunk Assessment Upper Extremity Assessment Upper Extremity Assessment: Generalized weakness   Lower Extremity Assessment Lower Extremity Assessment: Defer to PT evaluation        Vision   Vision Assessment?: No apparent visual deficits   Perception     Praxis     Communication Communication Communication: No apparent difficulties   Cognition Arousal: Alert Behavior During Therapy: WFL for tasks assessed/performed Cognition: Cognition impaired     Awareness: Online awareness impaired Memory impairment (select all impairments): Short-term memory, Working memory Attention impairment (select first level of impairment): Selective attention Executive functioning impairment (select all impairments): Problem solving, Organization OT - Cognition Comments:  pt oriented to place, follows commands with increased time but requires cueing to recall hand placement and safety throughout session                  Following commands: Impaired Following commands impaired: Follows multi-step commands inconsistently, Follows one step commands with increased time      Cueing   Cueing Techniques: Verbal cues  Exercises      Shoulder Instructions       General Comments pt on 3L upon entry, up to 4L sitting EOB and 5L during activity to maintain SpO2 > 90%. back on 3L upon exit. Educated on incentive spirometer use 10x/hr, pt pulling ~250.  Educated on splinting with pillow to improve comfort when coughing.    Pertinent Vitals/ Pain       Pain Assessment Pain Assessment: Faces Faces Pain Scale: Hurts little more Pain Location: generalized Pain Descriptors / Indicators: Discomfort, Grimacing, Guarding Pain Intervention(s): Limited activity within patient's tolerance, Monitored during session, Repositioned  Home Living                                          Prior Functioning/Environment              Frequency  Min 2X/week        Progress Toward Goals  OT Goals(current goals can now be found in the care plan section)  Progress towards OT goals: Progressing toward goals  Acute Rehab OT Goals Patient Stated Goal: home OT Goal Formulation: With patient Time For Goal Achievement: 12/03/23 Potential to Achieve Goals: Good  Plan      Co-evaluation                 AM-PAC OT 6 Clicks Daily Activity     Outcome Measure   Help from another person eating meals?: A Little Help from another person taking care of personal grooming?: A Little Help from another person toileting, which includes using toliet, bedpan, or urinal?: A Little Help from another person bathing (including washing, rinsing, drying)?: A Little Help from another person to put on and taking off regular upper body clothing?: A Little Help from another person to put on and taking off regular lower body clothing?: A Little 6 Click Score: 18    End of Session Equipment Utilized During  Treatment: Rolling walker (2 wheels);Oxygen  (3-5L)  OT Visit Diagnosis: Other abnormalities of gait and mobility (R26.89);Muscle weakness (generalized) (M62.81);Other symptoms and signs involving cognitive function   Activity Tolerance Patient tolerated treatment well   Patient Left in chair;with call bell/phone within reach;with nursing/sitter in room;with family/visitor present   Nurse Communication Mobility status;Other (comment) (SpO2)        Time: 9142-9071 OT Time Calculation (min): 31 min  Charges: OT General Charges $OT Visit: 1 Visit OT Treatments $Self Care/Home Management : 23-37 mins  Etta NOVAK, OT Acute Rehabilitation Services Office [phone] Secure Chat Preferred    Etta GORMAN Hope 11/22/2023, 9:35 AM

## 2023-11-22 NOTE — Discharge Summary (Signed)
 PATIENT DETAILS Name: Joan Smith Age: 83 y.o. Sex: female Date of Birth: 09-Jul-1940 MRN: [id number]. Admitting Physician: Donalda CHRISTELLA Applebaum, MD ERE:[name], [name], MD  Admit Date: 11/18/2023 Discharge date: 11/22/2023  Recommendations for Outpatient Follow-up:  Follow up with PCP in 1-2 weeks Please obtain CMP/CBC in one week To review chest x-ray or repeat CT chest in 4 to 6 weeks to document resolution of pneumonia Outpatient neurology evaluation for neurocognitive evaluation (possible cognitive issues/dementia) See below regarding incidental finding of lung nodule-CT chest that was done on 7/14 (prior to this hospitalization)  Admitted From:  Home  Disposition: Home health   Discharge Condition: good  CODE STATUS:   Code Status: Limited: Do not attempt resuscitation (DNR) -DNR-LIMITED -Do Not Intubate/DNI    Diet recommendation:  Diet Order             Diet - low sodium heart healthy           Diet Heart Room service appropriate? Yes; Fluid consistency: Thin  Diet effective now                    Brief Summary: Patient is a 83 y.o.  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   Significant events: 7/17>> admit to [facility]   Significant studies: 7/14>> CTA chest: No PE, left upper lobe opacity.   Significant microbiology data: 7/17>> COVID/influenza/RSV PCR: Negative 7/17>> blood culture: No growth   Procedures: None   Consults: None  Brief Hospital Course: Left lobar PNA Clinically improved-afebrile-cough is much better-no leukocytosis. Completed 3 days of azithromycin  and 5 days of Rocephin  while in the hospital No need for any further antibiotics PCP to repeat two-view  chest x-ray in 4 to 6 weeks to document resolution of PNA.   Chronic cough Ongoing for at least 6 months-predates this recent PNA.  Much worse prior to this hospitalization. Admitted and started on antisinus therapy with Flonase /Afrin x 3 days/Claritin , anti-GERD therapy with PPI twice daily-and antibiotics for PNA-cough seems to have improved quite a lot. On discharge-continue Flonase /antihistamine-be continued on PPI Evaluated by SLP-no major issues noted Continue Tessalon /Robitussin as needed for mild cough, and Tussionex as needed for severe intractable cough.   COPD/asthma with chronic hypoxic respiratory failure-on home O2 2 L No wheezing-this appears stable Note-acute hypoxic respiratory failure ruled out. Continue bronchodilators   Chronic HFmrEF  Appears stable-euvolemic Continue Aldactone  As needed Lasix  for any signs of volume overload.   History of frequent PVCs Continue bisoprolol  Telemetry monitoring   CAD No anginal symptoms Aspirin /statin/beta-blocker Monitor   Carotid artery stenosis Asymptomatic-nonobstructive Continue aspirin /statin.   Right apical/left apical pulmonary nodule-incidental finding on CTA chest on 7/14 Prior history of tobacco use Repeat noncontrast CT in 12 months.   Probably dementia/cognitive dysfunction with hospital delirium Significantly confused during the earlier part of her hospitalization-worsened by insomnia/acute illness Started on low-dose Seroquel /melatonin with significant improvement-for the past 2 days-confusion and insomnia issues are significantly better.   Discussed with son at bedside on 7/21-he would prefer that patient continue with current regimen of Seroquel /melatonin-if she continues to improve in her familiar surroundings-suspect we could potentially discontinue Seroquel  at some point in the near future.  I encouraged patient's son to discuss this with patient's primary  care practitioner. Will need outpatient dementia  workup-in discussion with PCP-May need outpatient neurology evaluation as well.   Insomnia Better with melatonin and Seroquel  combination-finally slept last night after 3 nights of not sleeping.   Goals of care/palliative care/advance directives. After extensive discussion with daughter/son-now DNR-plans are for discharge with outpatient palliative care. Goals of care for general medical treatment and slowly to focus more on comfort.  No further hospitalization per family. Appreciate palliative care input.    Discharge Diagnoses:  Principal Problem:   Acute on chronic respiratory failure with hypoxia (HCC) Active Problems:   CHF (congestive heart failure) (HCC)   Fibrosis of lung (HCC)   Moderate persistent asthma   Occlusion of extracranial carotid artery, bilateral   Primary pulmonary hypertension (HCC)   Primary restrictive cardiomyopathy (HCC)   History of TIA (transient ischemic attack)   CAP (community acquired pneumonia)   COPD with acute exacerbation (HCC)   PNA (pneumonia)   Discharge Instructions:  Activity:  As tolerated with Full fall precautions use walker/cane & assistance as needed Discharge Instructions     Call MD for:  difficulty breathing, headache or visual disturbances   Complete by: As directed    Call MD for:  extreme fatigue   Complete by: As directed    Diet - low sodium heart healthy   Complete by: As directed    Discharge instructions   Complete by: As directed    Follow with Primary MD  Keren [name], MD in 1-2 weeks  Please get a complete blood count and chemistry panel checked by your Primary MD at your next visit, and again as instructed by your Primary MD.  Get Medicines reviewed and adjusted: Please take all your medications with you for your next visit with your Primary MD  Laboratory/radiological data: Please request your Primary MD to go over all hospital tests and procedure/radiological results at the follow up, please ask  your Primary MD to get all Hospital records sent to his/her office.  In some cases, they will be blood work, cultures and biopsy results pending at the time of your discharge. Please request that your primary care M.D. follows up on these results.  Also Note the following: If you experience worsening of your admission symptoms, develop shortness of breath, life threatening emergency, suicidal or homicidal thoughts you must seek medical attention immediately by calling 911 or calling your MD immediately  if symptoms less severe.  You must read complete instructions/literature along with all the possible adverse reactions/side effects for all the Medicines you take and that have been prescribed to you. Take any new Medicines after you have completely understood and accpet all the possible adverse reactions/side effects.   Do not drive when taking Pain medications or sleeping medications (Benzodaizepines)  Do not take more than prescribed Pain, Sleep and Anxiety Medications. It is not advisable to combine anxiety,sleep and pain medications without talking with your primary care practitioner  Special Instructions: If you have smoked or chewed Tobacco  in the last 2 yrs please stop smoking, stop any regular Alcohol  and or any Recreational drug use.  Wear Seat belts while driving.  Please note: You were cared for by a hospitalist during your hospital stay. Once you are discharged, your primary care physician will handle any further medical issues. Please note that NO REFILLS for any discharge medications will be authorized once you are discharged, as it is imperative that you return to your primary care physician (or establish a relationship  with a primary care physician if you do not have one) for your post hospital discharge needs so that they can reassess your need for medications and monitor your lab values.   Increase activity slowly   Complete by: As directed       Allergies as of 11/22/2023        Reactions   Amoxicillin Diarrhea, Nausea And Vomiting   Ceclor [cefaclor] Hives, Rash        Medication List     STOP taking these medications    benzonatate  200 MG capsule Commonly known as: TESSALON    ciprofloxacin  500 MG tablet Commonly known as: CIPRO        TAKE these medications    albuterol  (2.5 MG/3ML) 0.083% nebulizer solution Commonly known as: PROVENTIL  Take 3 mLs (2.5 mg total) by nebulization every 6 (six) hours as needed for wheezing or shortness of breath.   ammonium lactate 12 % lotion Commonly known as: LAC-HYDRIN Apply 1 Application topically 2 (two) times daily.   aspirin  EC 81 MG tablet Take 81 mg by mouth at bedtime.   bisoprolol  10 MG tablet Commonly known as: ZEBETA  Take 1 tablet (10 mg total) by mouth daily.   Breztri  Aerosphere 160-9-4.8 MCG/ACT Aero inhaler Generic drug: budesonide -glycopyrrolate -formoterol  Inhale 2 puffs into the lungs in the morning and at bedtime.   cetirizine 10 MG tablet Commonly known as: ZYRTEC Take 10 mg by mouth daily.   chlorpheniramine-HYDROcodone 10-8 MG/5ML Commonly known as: TUSSIONEX Take 5 mLs by mouth every 12 (twelve) hours as needed (intractable cough).   fluticasone  50 MCG/ACT nasal spray Commonly known as: FLONASE  Place 2 sprays into both nostrils daily.   furosemide  20 MG tablet Commonly known as: LASIX  Take 1 tablet (20 mg total) by mouth as needed.   guaiFENesin  100 MG/5ML liquid Commonly known as: ROBITUSSIN Take 5 mLs by mouth every 4 (four) hours as needed for cough or to loosen phlegm.   ipratropium 0.03 % nasal spray Commonly known as: ATROVENT  Place 2 sprays into both nostrils 3 (three) times daily.   levalbuterol 45 MCG/ACT inhaler Commonly known as: XOPENEX HFA Inhale 2 puffs into the lungs as needed for wheezing or shortness of breath.   magnesium oxide 400 MG tablet Commonly known as: MAG-OX Take 400 mg by mouth daily.   Melatonin 10 MG Tabs Take 10 mg by mouth  at bedtime.   NON FORMULARY RELAXIUM as needed for sleep   pantoprazole  40 MG tablet Commonly known as: PROTONIX  Take 1 tablet (40 mg total) by mouth 2 (two) times daily. Take twice daily for 4 weeks and then switch to once daily.   polyethylene glycol 17 g packet Commonly known as: MIRALAX  / GLYCOLAX  Take 17 g by mouth daily as needed.   QUEtiapine  25 MG tablet Commonly known as: SEROQUEL  Take 1 tablet (25 mg total) by mouth at bedtime.   rosuvastatin  20 MG tablet Commonly known as: CRESTOR  Take 20 mg by mouth at bedtime.   Spacer/Aero-Holding Raguel French Use with inhaler   spironolactone  25 MG tablet Commonly known as: ALDACTONE  Take 0.5 tablets (12.5 mg total) by mouth daily.               Durable Medical Equipment  (From admission, onward)           Start     Ordered   11/21/23 1055  For home use only DME Bedside commode  Once       Question:  Patient needs a bedside commode  to treat with the following condition  Answer:  Weakness   11/21/23 1054            Follow-up Information     Keren [name], MD. Schedule an appointment as soon as possible for a visit in 1 week(s).   Specialty: Internal Medicine Contact information: [address] [place] [address] [phone]                Allergies  Allergen Reactions   Amoxicillin Diarrhea and Nausea And Vomiting   Ceclor [Cefaclor] Hives and Rash     Other Procedures/Studies: DG CHEST PORT 1 VIEW Result Date: 11/20/2023 CLINICAL DATA:  141880 SOB (shortness of breath) 141880 EXAM: PORTABLE CHEST 1 VIEW COMPARISON:  Chest x-ray 11/18/2023 FINDINGS: Question right to left mediastinal shift possibly due to volume loss on the left. The heart and mediastinal contours are unchanged. Atherosclerotic plaque. Left mid to lower lung zone density likely combination of lung disease and pleural effusion. Right base atelectasis. Chronic coarsened interstitial markings with no  overt pulmonary edema. Trace right pleural effusion no pneumothorax. No acute osseous abnormality. IMPRESSION: 1. Left mid to lower lung zone density likely combination of lung disease, atelectasis, and pleural effusion. Recommend checks x-ray repeat PA and lateral view for further evaluation. 2. Right base atelectasis.  Trace right pleural effusion. 3. Aortic Atherosclerosis (ICD10-I70.0) and Emphysema (ICD10-J43.9). Electronically Signed   By: Morgane  Naveau M.D.   On: 11/20/2023 22:16   DG Swallowing Func-Speech Pathology Result Date: 11/19/2023 Table formatting from the original result was not included. Modified Barium Swallow Study Patient Details Name: Mel Tadros Lagerquist MRN: [id number] Date of Birth: Dec 03, 1940 Today's Date: 11/19/2023 HPI/PMH: HPI: 83 yo female referred to ED by [facility] 7/17 for worsening lethargy/weakness and new dx of PNA. PMH includes: COPD, asthma, chronic hypoxic respiratory failure on 2L O2 at home, chronic cough for almost 6 months PTA (has completed several rounds of antibiotics/steroids), TIA, CAD< CHF, pulmonary fibrosis Clinical Impression: Clinical Impression: Pt has a very mild oropharyngeal dysphagia, some of which may be attributed to current cognitive status. She has brisk posterior lingual transit but does not always swallow all of the bolus, leaving a collection of residue in her oral cavity intermittently. She had significant difficulty with propulsion of the barium tablet with thin liquid wash, and when given a bolus of puree to facilitate clearance, pt then masticated the pill and swallowed the pieces. Pharyngeally she was reduced laryngeal elevation and laryngeal vestibule closure with penetration of thin and nectar thick liquids that is primarily transient (PAS 2, considered to be normal). There was more frank penetration x2 given thin liquids (one of which was when trying to clear the barium tablet). This did clear with a spontaneous throat clear, and no aspiration was  observed. During esophageal sweep there was retention within the esophagus as well as some retrograde flow. Along with pt's symptoms of increasing heartburn and trouble with solids, this could be more suggestive of a primary esophageal dysphagia. Will leave on regular solids and thin liquids as tolerated. Pt and son were given education about esophageal and aspiration precautions to utilize. Factors that may increase risk of adverse event in presence of aspiration Noe & Lianne 2021): Factors that may increase risk of adverse event in presence of aspiration Noe & Lianne 2021): Respiratory or GI disease; Reduced cognitive function; Frail or deconditioned Recommendations/Plan: Swallowing Evaluation Recommendations Swallowing Evaluation Recommendations Recommendations: PO diet PO Diet Recommendation: Regular; Thin liquids (Level 0)  Liquid Administration via: Cup; Straw Medication Administration: Whole meds with liquid (as tolerated - crush PRN) Supervision: Patient able to self-feed; Full supervision/cueing for swallowing strategies Swallowing strategies  : Minimize environmental distractions; Slow rate; Small bites/sips; Follow solids with liquids Postural changes: Position pt fully upright for meals; Stay upright 30-60 min after meals Oral care recommendations: Oral care BID (2x/day) Recommended consults: Consider esophageal assessment Treatment Plan Treatment Plan Treatment recommendations: Therapy as outlined in treatment plan below Follow-up recommendations: No SLP follow up Functional status assessment: Patient has had a recent decline in their functional status and demonstrates the ability to make significant improvements in function in a reasonable and predictable amount of time. Treatment frequency: Min 2x/week Treatment duration: 2 weeks Interventions: Aspiration precaution training; Patient/family education; Diet toleration management by SLP Recommendations Recommendations for follow up therapy are  one component of a multi-disciplinary discharge planning process, led by the attending physician.  Recommendations may be updated based on patient status, additional functional criteria and insurance authorization. Assessment: Orofacial Exam: Orofacial Exam Oral Cavity: Oral Hygiene: WFL Oral Cavity - Dentition: Dentures, top; Missing dentition (missing dentition on bottom, says her lower partials do not fit well) Orofacial Anatomy: WFL Oral Motor/Sensory Function: WFL Anatomy: Anatomy: Suspected cervical osteophytes Boluses Administered: Boluses Administered Boluses Administered: Thin liquids (Level 0); Mildly thick liquids (Level 2, nectar thick); Puree; Solid  Oral Impairment Domain: Oral Impairment Domain Lip Closure: No labial escape Tongue control during bolus hold: Posterior escape of greater than half of bolus (due to cognition) Bolus preparation/mastication: Timely and efficient chewing and mashing Bolus transport/lingual motion: Brisk tongue motion Oral residue: Residue collection on oral structures Location of oral residue : Floor of mouth; Lateral sulci; Tongue Initiation of pharyngeal swallow : Pyriform sinuses  Pharyngeal Impairment Domain: Pharyngeal Impairment Domain Soft palate elevation: No bolus between soft palate (SP)/pharyngeal wall (PW) Laryngeal elevation: Partial superior movement of thyroid  cartilage/partial approximation of arytenoids to epiglottic petiole Anterior hyoid excursion: Complete anterior movement Epiglottic movement: Complete inversion Laryngeal vestibule closure: Incomplete, narrow column air/contrast in laryngeal vestibule Pharyngeal stripping wave : Present - complete Pharyngeal contraction (A/P view only): N/A Pharyngoesophageal segment opening: Partial distention/partial duration, partial obstruction of flow Tongue base retraction: Trace column of contrast or air between tongue base and PPW Pharyngeal residue: Trace residue within or on pharyngeal structures Location of  pharyngeal residue: Valleculae; Pyriform sinuses  Esophageal Impairment Domain: Esophageal Impairment Domain Esophageal clearance upright position: Esophageal retention with retrograde flow below pharyngoesophageal segment (PES) Pill: Pill Consistency administered: Thin liquids (Level 0); Puree Thin liquids (Level 0): Impaired (see clinical impressions) Puree: Impaired (see clinical impressions) Penetration/Aspiration Scale Score: Penetration/Aspiration Scale Score 1.  Material does not enter airway: Puree; Solid; Pill 2.  Material enters airway, remains ABOVE vocal cords then ejected out: Mildly thick liquids (Level 2, nectar thick) 3.  Material enters airway, remains ABOVE vocal cords and not ejected out: Thin liquids (Level 0) Compensatory Strategies: Compensatory Strategies Compensatory strategies: Yes Liquid wash: Ineffective Ineffective Liquid Wash: Pill   General Information: Caregiver present: Yes (son)  Diet Prior to this Study: Regular; Thin liquids (Level 0)   Temperature : Normal   Respiratory Status: WFL   Supplemental O2: Nasal cannula   History of Recent Intubation: No  Behavior/Cognition: Alert; Cooperative; Pleasant mood; Confused; Requires cueing Self-Feeding Abilities: Able to self-feed Baseline vocal quality/speech: Normal Volitional Cough: Able to elicit Volitional Swallow: Able to elicit Exam Limitations: No limitations Goal Planning: Prognosis for improved oropharyngeal function: Good Barriers to Reach Goals:  Cognitive deficits No data recorded Patient/Family Stated Goal: none stated Consulted and agree with results and recommendations: Patient; Family member/caregiver (son) Pain: Pain Assessment Pain Assessment: No/denies pain Faces Pain Scale: 0 End of Session: Start Time:SLP Start Time (ACUTE ONLY): 1407 Stop Time: SLP Stop Time (ACUTE ONLY): 1424 Time Calculation:SLP Time Calculation (min) (ACUTE ONLY): 17 min Charges: SLP Evaluations $ SLP Speech Visit: 1 Visit SLP Evaluations $BSS  Swallow: 1 Procedure $MBS Swallow: 1 Procedure SLP visit diagnosis: SLP Visit Diagnosis: Dysphagia, unspecified (R13.10) Past Medical History: Past Medical History: Diagnosis Date  Altered mental status 09/13/2019  Asthma   Broken heart syndrome 2013  Carotid arterial disease (HCC)   CHF (congestive heart failure) (HCC)   COPD (chronic obstructive pulmonary disease) (HCC)   Hypertension   OSA (obstructive sleep apnea) 2014  Paroxysmal SVT (supraventricular tachycardia) (HCC)   Pulmonary fibrosis (HCC)   Pulmonary hypertension (HCC) 10/27/2019  PVC (premature ventricular contraction)   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 CCC-SLP Acute Rehabilitation Services Office: [phone] Secure chat preferred 11/19/2023, 2:50 PM  DG Chest 2 View Result Date: 11/18/2023 CLINICAL DATA:  Shortness of breath. EXAM: CHEST - 2 VIEW COMPARISON:  October 13, 2023. FINDINGS: Stable cardiomegaly. Right upper lobe scarring is again noted. Minimal right basilar subsegmental atelectasis or scarring is noted. Mild left basilar opacity is noted concerning for atelectasis or infiltrate with associated effusion. Bony thorax is unremarkable. IMPRESSION: Mild left basilar opacity concerning for atelectasis or infiltrate with associated effusion. Aortic Atherosclerosis (ICD10-I70.0). Electronically Signed   By: Lynwood Landy Raddle M.D.   On: 11/18/2023 15:05   CT Angio Chest Pulmonary Embolism (PE) W or WO Contrast Result Date: 11/15/2023 CLINICAL DATA:  Dyspnea, cardiac arrhythmia suspected, ischemia excluded Pulmonary embolism (PE) suspected, high prob EXAM: CT ANGIOGRAPHY CHEST WITH CONTRAST TECHNIQUE: Multidetector CT imaging of the chest was performed using the standard protocol during bolus administration of intravenous contrast. Multiplanar CT image reconstructions and MIPs were  obtained to evaluate the vascular anatomy. RADIATION DOSE REDUCTION: This exam was performed according to the departmental dose-optimization program which includes automated exposure control, adjustment of the mA and/or kV according to patient size and/or use of iterative reconstruction technique. CONTRAST:  75mL OMNIPAQUE  IOHEXOL  350 MG/ML SOLN COMPARISON:  CT chest 02/20/2022 FINDINGS: Cardiovascular: Satisfactory opacification of the pulmonary arteries to the segmental level. No evidence of pulmonary embolism. Normal heart size. No significant pericardial effusion. The thoracic aorta is normal in caliber. Severe atherosclerotic plaque of the thoracic aorta. Four-vessel coronary artery calcifications. Mediastinum/Nodes: No enlarged mediastinal, hilar, or axillary lymph nodes. Thyroid  gland, trachea, and esophagus demonstrate no significant findings. Lungs/Pleura: Moderate paraseptal and centrilobular emphysematous changes. Similar-appearing reticulation most prominent along the paramediastinal bilateral upper lobes with associated architectural distortion. Interval development of lingular and left upper lobe patchy airspace opacity. Similar-appearing cluster of pulmonary micronodules within the right middle lobe. Stable right middle lobe 7 x 6 mm pulmonary nodule (6:70). Interval development of a right upper lobe 3 mm pulmonary nodule (6:28). Interval development of a couple of left apical pulmonary micronodules (6:24). No pulmonary mass. No pleural effusion. No pneumothorax. Upper Abdomen: No acute abnormality. Musculoskeletal: No chest wall abnormality. No suspicious lytic or blastic osseous lesions. No acute displaced fracture. Multilevel degenerative changes of the spine. Review of the MIP images confirms the above findings. IMPRESSION: 1. No acute pulmonary embolus. 2. Interval development of lingular  and left upper lobe patchy airspace opacity. Question developing infection. Recommend follow-up CT in 3  months to evaluate for complete resolution. 3. Interval development of a right apical and two left apical pulmonary micronodules. No follow-up needed if patient is low-risk (and has no known or suspected primary neoplasm). Non-contrast chest CT can be considered in 12 months if patient is high-risk. This recommendation follows the consensus statement: Guidelines for Management of Incidental Pulmonary Nodules Detected on CT Images: From the Fleischner Society 2017; Radiology 2017; 284:228-243. 4. Stable scattered pulmonary micronodules with cluster pulmonary micronodules within the right middle lobe as well as a stable 7 x 6 mm right middle lobe pulmonary nodule. No further follow-up indicated. 5. Emphysema (ICD10-J43.9) and underlying interstitial lung disease. 6. Aortic Atherosclerosis (ICD10-I70.0) -severe, including four-vessel coronary calcification. Electronically Signed   By: Morgane  Naveau M.D.   On: 11/15/2023 16:50     TODAY-DAY OF DISCHARGE:  Subjective:   [place] today has no headache,no chest abdominal pain,no new weakness tingling or numbness, feels much better wants to go home today.   Objective:   Blood pressure 124/72, pulse 67, temperature 98.2 F (36.8 C), temperature source Oral, resp. rate 16, height 5' 4 (1.626 m), weight 61.5 kg, SpO2 93%.  Intake/Output Summary (Last 24 hours) at 11/22/2023 0841 Last data filed at 11/21/2023 1600 Gross per 24 hour  Intake 321.3 ml  Output 400 ml  Net -78.7 ml   Filed Weights   11/18/23 1303 11/18/23 1336 11/19/23 0515  Weight: 62.1 kg 62.1 kg 61.5 kg    Exam: Awake Alert, Oriented *3, No new F.N deficits, Normal affect [place].AT,PERRAL Supple Neck,No JVD, No cervical lymphadenopathy appriciated.  Symmetrical Chest wall movement, Good air movement bilaterally, CTAB RRR,No Gallops,Rubs or new Murmurs, No Parasternal Heave +ve B.Sounds, Abd Soft, Non tender, No organomegaly appriciated, No rebound -guarding or rigidity. No  Cyanosis, Clubbing or edema, No new Rash or bruise   PERTINENT RADIOLOGIC STUDIES: DG CHEST PORT 1 VIEW Result Date: 11/20/2023 CLINICAL DATA:  141880 SOB (shortness of breath) 141880 EXAM: PORTABLE CHEST 1 VIEW COMPARISON:  Chest x-ray 11/18/2023 FINDINGS: Question right to left mediastinal shift possibly due to volume loss on the left. The heart and mediastinal contours are unchanged. Atherosclerotic plaque. Left mid to lower lung zone density likely combination of lung disease and pleural effusion. Right base atelectasis. Chronic coarsened interstitial markings with no overt pulmonary edema. Trace right pleural effusion no pneumothorax. No acute osseous abnormality. IMPRESSION: 1. Left mid to lower lung zone density likely combination of lung disease, atelectasis, and pleural effusion. Recommend checks x-ray repeat PA and lateral view for further evaluation. 2. Right base atelectasis.  Trace right pleural effusion. 3. Aortic Atherosclerosis (ICD10-I70.0) and Emphysema (ICD10-J43.9). Electronically Signed   By: Morgane  Naveau M.D.   On: 11/20/2023 22:16     PERTINENT LAB RESULTS: CBC: Recent Labs    11/21/23 0704  WBC 10.7*  HGB 10.4*  HCT 34.3*  PLT 192   CMET CMP     Component Value Date/Time   NA 139 11/21/2023 0704   NA 141 11/25/2020 1421   K 4.2 11/21/2023 0704   CL 102 11/21/2023 0704   CO2 27 11/21/2023 0704   GLUCOSE 125 (H) 11/21/2023 0704   BUN 18 11/21/2023 0704   BUN 24 11/25/2020 1421   CREATININE 0.81 11/21/2023 0704   CALCIUM  8.4 (L) 11/21/2023 0704   PROT 5.8 (L) 11/19/2023 0539   ALBUMIN 2.3 (L) 11/19/2023 0539   AST  17 11/19/2023 0539   ALT 13 11/19/2023 0539   ALKPHOS 64 11/19/2023 0539   BILITOT 0.4 11/19/2023 0539   GFR 51.27 (L) 05/11/2023 0941   EGFR 89 11/25/2020 1421   GFRNONAA >60 11/21/2023 0704    GFR Estimated Creatinine Clearance: 44.6 mL/min (by C-G formula based on SCr of 0.81 mg/dL). No results for input(s): LIPASE, AMYLASE in the  last 72 hours. No results for input(s): CKTOTAL, CKMB, CKMBINDEX, TROPONINI in the last 72 hours. Invalid input(s): POCBNP No results for input(s): DDIMER in the last 72 hours. No results for input(s): HGBA1C in the last 72 hours. No results for input(s): CHOL, HDL, LDLCALC, TRIG, CHOLHDL, LDLDIRECT in the last 72 hours. No results for input(s): TSH, T4TOTAL, T3FREE, THYROIDAB in the last 72 hours.  Invalid input(s): FREET3 No results for input(s): VITAMINB12, FOLATE, FERRITIN, TIBC, IRON, RETICCTPCT in the last 72 hours. Coags: No results for input(s): INR in the last 72 hours.  Invalid input(s): PT Microbiology: Recent Results (from the past 240 hours)  Resp panel by RT-PCR (RSV, Flu A&B, Covid) Anterior Nasal Swab     Status: None   Collection Time: 11/18/23  1:45 PM   Specimen: Anterior Nasal Swab  Result Value Ref Range Status   SARS Coronavirus 2 by RT PCR NEGATIVE NEGATIVE Final   Influenza A by PCR NEGATIVE NEGATIVE Final   Influenza B by PCR NEGATIVE NEGATIVE Final    Comment: (NOTE) The Xpert Xpress SARS-CoV-2/FLU/RSV plus assay is intended as an aid in the diagnosis of influenza from Nasopharyngeal swab specimens and should not be used as a sole basis for treatment. Nasal washings and aspirates are unacceptable for Xpert Xpress SARS-CoV-2/FLU/RSV testing.  Fact Sheet for Patients: BloggerCourse.com  Fact Sheet for Healthcare Providers: SeriousBroker.it  This test is not yet approved or cleared by the [place]  FDA and has been authorized for detection and/or diagnosis of SARS-CoV-2 by FDA under an Emergency Use Authorization (EUA). This EUA will remain in effect (meaning this test can be used) for the duration of the COVID-19 declaration under Section 564(b)(1) of the Act, 21 U.S.C. section 360bbb-3(b)(1), unless the authorization is terminated or revoked.      Resp Syncytial Virus by PCR NEGATIVE NEGATIVE Final    Comment: (NOTE) Fact Sheet for Patients: BloggerCourse.com  Fact Sheet for Healthcare Providers: SeriousBroker.it  This test is not yet approved or cleared by the [place]  FDA and has been authorized for detection and/or diagnosis of SARS-CoV-2 by FDA under an Emergency Use Authorization (EUA). This EUA will remain in effect (meaning this test can be used) for the duration of the COVID-19 declaration under Section 564(b)(1) of the Act, 21 U.S.C. section 360bbb-3(b)(1), unless the authorization is terminated or revoked.  Performed at [facility], [address] [place], [place] [address]   Blood culture (routine x 2)     Status: None (Preliminary result)   Collection Time: 11/18/23  1:45 PM   Specimen: BLOOD  Result Value Ref Range Status   Specimen Description BLOOD SITE NOT SPECIFIED  Final   Special Requests   Final    BOTTLES DRAWN AEROBIC AND ANAEROBIC Blood Culture adequate volume   Culture   Final    NO GROWTH 3 DAYS Performed at [facility], [address] [place], [place] [address]    Report Status PENDING  Incomplete  Blood culture (routine x 2)     Status: None (Preliminary result)   Collection Time: 11/19/23  5:39  AM   Specimen: BLOOD RIGHT ARM  Result Value Ref Range Status   Specimen Description BLOOD RIGHT ARM  Final   Special Requests   Final    BOTTLES DRAWN AEROBIC AND ANAEROBIC Blood Culture adequate volume   Culture   Final    NO GROWTH 2 DAYS Performed at [facility], [address] [place], [place] [address]    Report Status PENDING  Incomplete    FURTHER DISCHARGE INSTRUCTIONS:  Get Medicines reviewed and adjusted: Please take all your medications with you for your next visit with your Primary MD  Laboratory/radiological data: Please request your Primary MD to go over all hospital tests and  procedure/radiological results at the follow up, please ask your Primary MD to get all Hospital records sent to his/her office.  In some cases, they will be blood work, cultures and biopsy results pending at the time of your discharge. Please request that your primary care M.D. goes through all the records of your hospital data and follows up on these results.  Also Note the following: If you experience worsening of your admission symptoms, develop shortness of breath, life threatening emergency, suicidal or homicidal thoughts you must seek medical attention immediately by calling 911 or calling your MD immediately  if symptoms less severe.  You must read complete instructions/literature along with all the possible adverse reactions/side effects for all the Medicines you take and that have been prescribed to you. Take any new Medicines after you have completely understood and accpet all the possible adverse reactions/side effects.   Do not drive when taking Pain medications or sleeping medications (Benzodaizepines)  Do not take more than prescribed Pain, Sleep and Anxiety Medications. It is not advisable to combine anxiety,sleep and pain medications without talking with your primary care practitioner  Special Instructions: If you have smoked or chewed Tobacco  in the last 2 yrs please stop smoking, stop any regular Alcohol  and or any Recreational drug use.  Wear Seat belts while driving.  Please note: You were cared for by a hospitalist during your hospital stay. Once you are discharged, your primary care physician will handle any further medical issues. Please note that NO REFILLS for any discharge medications will be authorized once you are discharged, as it is imperative that you return to your primary care physician (or establish a relationship with a primary care physician if you do not have one) for your post hospital discharge needs so that they can reassess your need for medications and  monitor your lab values.  Total Time spent coordinating discharge including counseling, education and face to face time equals greater than 30 minutes.  SignedBETHA Donalda Applebaum 11/22/2023 8:41 AM

## 2023-11-22 NOTE — Care Management Important Message (Signed)
 Important Message  Patient Details  Name: Joan Smith MRN: [id number] Date of Birth: March 31, 1941   Important Message Given:  Yes - Medicare IM  Patient left prior to IM delivery will mail to the patient home address.    Elle Vezina 11/22/2023, 4:59 PM

## 2023-11-22 NOTE — Progress Notes (Signed)
   11/22/23 1217  Spiritual Encounters  Type of Visit Initial  Care provided to: Pt and family  Conversation partners present during encounter Nurse  Reason for visit Advance directives  OnCall Visit No   Chaplain provided spiritual care to Patient's son and orchestrated notary and two witnesses to come to Patient's room to notarize completed AD. Chaplain provided Pt's son and daughter with completed original AD and two copies of completed AD, and scanned the completed AD for the Chaplain's office and gave a copy of the completed AD to nurse. Pt is being discharged today. No further spiritual need at this time.  Chaplain Therisa Samuel

## 2023-11-23 DIAGNOSIS — Z87891 Personal history of nicotine dependence: Secondary | ICD-10-CM | POA: Diagnosis not present

## 2023-11-23 DIAGNOSIS — I10 Essential (primary) hypertension: Secondary | ICD-10-CM | POA: Diagnosis not present

## 2023-11-23 DIAGNOSIS — E43 Unspecified severe protein-calorie malnutrition: Secondary | ICD-10-CM | POA: Diagnosis not present

## 2023-11-23 DIAGNOSIS — I5023 Acute on chronic systolic (congestive) heart failure: Secondary | ICD-10-CM | POA: Diagnosis not present

## 2023-11-23 DIAGNOSIS — Z9981 Dependence on supplemental oxygen: Secondary | ICD-10-CM | POA: Diagnosis not present

## 2023-11-23 DIAGNOSIS — J90 Pleural effusion, not elsewhere classified: Secondary | ICD-10-CM | POA: Diagnosis not present

## 2023-11-23 DIAGNOSIS — R0602 Shortness of breath: Secondary | ICD-10-CM | POA: Diagnosis not present

## 2023-11-23 DIAGNOSIS — Z681 Body mass index (BMI) 19 or less, adult: Secondary | ICD-10-CM | POA: Diagnosis not present

## 2023-11-23 DIAGNOSIS — J189 Pneumonia, unspecified organism: Secondary | ICD-10-CM | POA: Diagnosis not present

## 2023-11-23 DIAGNOSIS — I272 Pulmonary hypertension, unspecified: Secondary | ICD-10-CM | POA: Diagnosis not present

## 2023-11-23 DIAGNOSIS — J440 Chronic obstructive pulmonary disease with acute lower respiratory infection: Secondary | ICD-10-CM | POA: Diagnosis not present

## 2023-11-23 DIAGNOSIS — Z515 Encounter for palliative care: Secondary | ICD-10-CM | POA: Diagnosis not present

## 2023-11-23 DIAGNOSIS — R0603 Acute respiratory distress: Secondary | ICD-10-CM | POA: Diagnosis not present

## 2023-11-23 DIAGNOSIS — R9431 Abnormal electrocardiogram [ECG] [EKG]: Secondary | ICD-10-CM | POA: Diagnosis not present

## 2023-11-23 DIAGNOSIS — I4729 Other ventricular tachycardia: Secondary | ICD-10-CM | POA: Diagnosis not present

## 2023-11-23 DIAGNOSIS — Z888 Allergy status to other drugs, medicaments and biological substances status: Secondary | ICD-10-CM | POA: Diagnosis not present

## 2023-11-23 DIAGNOSIS — Z66 Do not resuscitate: Secondary | ICD-10-CM | POA: Diagnosis not present

## 2023-11-23 DIAGNOSIS — J441 Chronic obstructive pulmonary disease with (acute) exacerbation: Secondary | ICD-10-CM | POA: Diagnosis not present

## 2023-11-23 DIAGNOSIS — F32A Depression, unspecified: Secondary | ICD-10-CM | POA: Diagnosis not present

## 2023-11-23 DIAGNOSIS — Z79899 Other long term (current) drug therapy: Secondary | ICD-10-CM | POA: Diagnosis not present

## 2023-11-23 DIAGNOSIS — Z880 Allergy status to penicillin: Secondary | ICD-10-CM | POA: Diagnosis not present

## 2023-11-23 DIAGNOSIS — J9621 Acute and chronic respiratory failure with hypoxia: Secondary | ICD-10-CM | POA: Diagnosis not present

## 2023-11-23 LAB — CULTURE, BLOOD (ROUTINE X 2)
Culture: NO GROWTH
Special Requests: ADEQUATE

## 2023-11-24 LAB — CULTURE, BLOOD (ROUTINE X 2)
Culture: NO GROWTH
Special Requests: ADEQUATE

## 2023-11-25 ENCOUNTER — Encounter: Admitting: Internal Medicine

## 2023-12-03 DEATH — deceased

## 2023-12-22 ENCOUNTER — Encounter: Admitting: Internal Medicine

## 2023-12-27 ENCOUNTER — Other Ambulatory Visit (HOSPITAL_COMMUNITY)

## 2023-12-27 ENCOUNTER — Encounter (HOSPITAL_COMMUNITY)

## 2024-01-07 ENCOUNTER — Ambulatory Visit: Admitting: Nurse Practitioner

## 2024-01-18 ENCOUNTER — Ambulatory Visit: Admitting: Podiatry
